# Patient Record
Sex: Female | Born: 1945 | Race: White | Hispanic: No | Smoking: Never smoker
Health system: Southern US, Community
[De-identification: ages and names within clinical notes are randomized; demographics above are authoritative.]

## PROBLEM LIST (undated history)

## (undated) DIAGNOSIS — M545 Low back pain, unspecified: Secondary | ICD-10-CM

## (undated) DIAGNOSIS — G8929 Other chronic pain: Secondary | ICD-10-CM

## (undated) DIAGNOSIS — D649 Anemia, unspecified: Secondary | ICD-10-CM

## (undated) DIAGNOSIS — R112 Nausea with vomiting, unspecified: Secondary | ICD-10-CM

## (undated) DIAGNOSIS — I10 Essential (primary) hypertension: Secondary | ICD-10-CM

## (undated) DIAGNOSIS — M1990 Unspecified osteoarthritis, unspecified site: Secondary | ICD-10-CM

## (undated) DIAGNOSIS — S72002A Fracture of unspecified part of neck of left femur, initial encounter for closed fracture: Principal | ICD-10-CM

## (undated) DIAGNOSIS — Z9889 Other specified postprocedural states: Secondary | ICD-10-CM

## (undated) HISTORY — PX: ANKLE SURGERY: SHX546

## (undated) HISTORY — DX: Fracture of unspecified part of neck of left femur, initial encounter for closed fracture: S72.002A

## (undated) HISTORY — PX: ABDOMINAL HYSTERECTOMY: SHX81

## (undated) HISTORY — PX: BACK SURGERY: SHX140

## (undated) HISTORY — PX: CHOLECYSTECTOMY: SHX55

## (undated) HISTORY — PX: HIP SURGERY: SHX245

## (undated) HISTORY — PX: APPENDECTOMY: SHX54

---

## 1979-12-08 HISTORY — PX: GASTRIC BYPASS: SHX52

## 1999-08-26 ENCOUNTER — Ambulatory Visit (HOSPITAL_COMMUNITY): Admission: RE | Admit: 1999-08-26 | Discharge: 1999-08-26 | Payer: Self-pay | Admitting: Preventative Medicine

## 1999-08-26 ENCOUNTER — Encounter: Payer: Self-pay | Admitting: Preventative Medicine

## 1999-09-11 ENCOUNTER — Ambulatory Visit (HOSPITAL_COMMUNITY): Admission: RE | Admit: 1999-09-11 | Discharge: 1999-09-11 | Payer: Self-pay | Admitting: Preventative Medicine

## 1999-09-11 ENCOUNTER — Encounter: Payer: Self-pay | Admitting: Preventative Medicine

## 2001-05-28 ENCOUNTER — Emergency Department (HOSPITAL_COMMUNITY): Admission: EM | Admit: 2001-05-28 | Discharge: 2001-05-28 | Payer: Self-pay | Admitting: Emergency Medicine

## 2001-06-10 ENCOUNTER — Encounter: Payer: Self-pay | Admitting: Family Medicine

## 2001-06-10 ENCOUNTER — Ambulatory Visit (HOSPITAL_COMMUNITY): Admission: RE | Admit: 2001-06-10 | Discharge: 2001-06-10 | Payer: Self-pay | Admitting: Family Medicine

## 2001-11-18 ENCOUNTER — Encounter: Admission: RE | Admit: 2001-11-18 | Discharge: 2001-11-18 | Payer: Self-pay | Admitting: Specialist

## 2001-11-18 ENCOUNTER — Encounter: Payer: Self-pay | Admitting: Specialist

## 2001-11-22 ENCOUNTER — Encounter: Payer: Self-pay | Admitting: Specialist

## 2001-11-22 ENCOUNTER — Encounter: Admission: RE | Admit: 2001-11-22 | Discharge: 2001-11-22 | Payer: Self-pay | Admitting: Specialist

## 2001-12-08 ENCOUNTER — Encounter: Payer: Self-pay | Admitting: Emergency Medicine

## 2001-12-08 ENCOUNTER — Emergency Department (HOSPITAL_COMMUNITY): Admission: EM | Admit: 2001-12-08 | Discharge: 2001-12-08 | Payer: Self-pay | Admitting: Emergency Medicine

## 2002-01-03 ENCOUNTER — Encounter: Payer: Self-pay | Admitting: Specialist

## 2002-01-06 ENCOUNTER — Encounter: Payer: Self-pay | Admitting: Specialist

## 2002-01-06 ENCOUNTER — Inpatient Hospital Stay (HOSPITAL_COMMUNITY): Admission: RE | Admit: 2002-01-06 | Discharge: 2002-01-09 | Payer: Self-pay | Admitting: Specialist

## 2002-01-06 ENCOUNTER — Encounter (INDEPENDENT_AMBULATORY_CARE_PROVIDER_SITE_OTHER): Payer: Self-pay | Admitting: Specialist

## 2002-01-08 ENCOUNTER — Encounter: Payer: Self-pay | Admitting: Specialist

## 2002-02-09 ENCOUNTER — Inpatient Hospital Stay (HOSPITAL_COMMUNITY): Admission: EM | Admit: 2002-02-09 | Discharge: 2002-02-12 | Payer: Self-pay | Admitting: Emergency Medicine

## 2002-02-09 ENCOUNTER — Encounter: Payer: Self-pay | Admitting: Specialist

## 2002-03-03 ENCOUNTER — Encounter: Payer: Self-pay | Admitting: Specialist

## 2002-03-03 ENCOUNTER — Ambulatory Visit (HOSPITAL_COMMUNITY): Admission: RE | Admit: 2002-03-03 | Discharge: 2002-03-03 | Payer: Self-pay | Admitting: Specialist

## 2002-06-13 ENCOUNTER — Encounter: Admission: RE | Admit: 2002-06-13 | Discharge: 2002-06-13 | Payer: Self-pay | Admitting: Specialist

## 2002-06-13 ENCOUNTER — Encounter: Payer: Self-pay | Admitting: Specialist

## 2002-06-15 ENCOUNTER — Encounter: Admission: RE | Admit: 2002-06-15 | Discharge: 2002-06-15 | Payer: Self-pay | Admitting: Specialist

## 2002-08-15 ENCOUNTER — Emergency Department (HOSPITAL_COMMUNITY): Admission: EM | Admit: 2002-08-15 | Discharge: 2002-08-16 | Payer: Self-pay | Admitting: *Deleted

## 2002-08-15 ENCOUNTER — Encounter: Payer: Self-pay | Admitting: *Deleted

## 2002-08-31 ENCOUNTER — Emergency Department (HOSPITAL_COMMUNITY): Admission: EM | Admit: 2002-08-31 | Discharge: 2002-08-31 | Payer: Self-pay | Admitting: Internal Medicine

## 2002-09-24 ENCOUNTER — Emergency Department (HOSPITAL_COMMUNITY): Admission: EM | Admit: 2002-09-24 | Discharge: 2002-09-24 | Payer: Self-pay | Admitting: Emergency Medicine

## 2002-09-27 ENCOUNTER — Encounter: Payer: Self-pay | Admitting: Specialist

## 2002-09-27 ENCOUNTER — Ambulatory Visit (HOSPITAL_COMMUNITY): Admission: RE | Admit: 2002-09-27 | Discharge: 2002-09-27 | Payer: Self-pay | Admitting: Specialist

## 2002-12-14 ENCOUNTER — Inpatient Hospital Stay (HOSPITAL_COMMUNITY): Admission: RE | Admit: 2002-12-14 | Discharge: 2002-12-19 | Payer: Self-pay | Admitting: Orthopaedic Surgery

## 2002-12-14 ENCOUNTER — Encounter (INDEPENDENT_AMBULATORY_CARE_PROVIDER_SITE_OTHER): Payer: Self-pay | Admitting: *Deleted

## 2002-12-14 ENCOUNTER — Encounter: Payer: Self-pay | Admitting: Orthopedic Surgery

## 2003-06-07 ENCOUNTER — Emergency Department (HOSPITAL_COMMUNITY): Admission: EM | Admit: 2003-06-07 | Discharge: 2003-06-07 | Payer: Self-pay | Admitting: Emergency Medicine

## 2003-07-07 ENCOUNTER — Emergency Department (HOSPITAL_COMMUNITY): Admission: EM | Admit: 2003-07-07 | Discharge: 2003-07-07 | Payer: Self-pay | Admitting: *Deleted

## 2003-07-07 ENCOUNTER — Encounter: Payer: Self-pay | Admitting: *Deleted

## 2003-08-05 ENCOUNTER — Emergency Department (HOSPITAL_COMMUNITY): Admission: EM | Admit: 2003-08-05 | Discharge: 2003-08-05 | Payer: Self-pay | Admitting: Emergency Medicine

## 2003-09-02 ENCOUNTER — Emergency Department (HOSPITAL_COMMUNITY): Admission: EM | Admit: 2003-09-02 | Discharge: 2003-09-02 | Payer: Self-pay | Admitting: Emergency Medicine

## 2003-10-14 ENCOUNTER — Emergency Department (HOSPITAL_COMMUNITY): Admission: EM | Admit: 2003-10-14 | Discharge: 2003-10-14 | Payer: Self-pay | Admitting: Emergency Medicine

## 2003-12-22 ENCOUNTER — Emergency Department (HOSPITAL_COMMUNITY): Admission: EM | Admit: 2003-12-22 | Discharge: 2003-12-22 | Payer: Self-pay | Admitting: Emergency Medicine

## 2004-02-04 ENCOUNTER — Emergency Department (HOSPITAL_COMMUNITY): Admission: EM | Admit: 2004-02-04 | Discharge: 2004-02-05 | Payer: Self-pay | Admitting: Emergency Medicine

## 2004-02-11 ENCOUNTER — Emergency Department (HOSPITAL_COMMUNITY): Admission: EM | Admit: 2004-02-11 | Discharge: 2004-02-11 | Payer: Self-pay | Admitting: *Deleted

## 2004-07-23 ENCOUNTER — Emergency Department (HOSPITAL_COMMUNITY): Admission: EM | Admit: 2004-07-23 | Discharge: 2004-07-23 | Payer: Self-pay | Admitting: Emergency Medicine

## 2004-07-23 HISTORY — PX: ESOPHAGOGASTRODUODENOSCOPY: SHX1529

## 2004-12-26 ENCOUNTER — Ambulatory Visit (HOSPITAL_COMMUNITY): Admission: RE | Admit: 2004-12-26 | Discharge: 2004-12-26 | Payer: Self-pay | Admitting: Family Medicine

## 2005-01-23 ENCOUNTER — Emergency Department (HOSPITAL_COMMUNITY): Admission: EM | Admit: 2005-01-23 | Discharge: 2005-01-23 | Payer: Self-pay | Admitting: Emergency Medicine

## 2005-01-28 ENCOUNTER — Ambulatory Visit (HOSPITAL_COMMUNITY): Admission: RE | Admit: 2005-01-28 | Discharge: 2005-01-28 | Payer: Self-pay | Admitting: Family Medicine

## 2007-09-16 ENCOUNTER — Ambulatory Visit (HOSPITAL_COMMUNITY): Admission: RE | Admit: 2007-09-16 | Discharge: 2007-09-16 | Payer: Self-pay | Admitting: Family Medicine

## 2009-12-07 HISTORY — PX: ESOPHAGOGASTRODUODENOSCOPY: SHX1529

## 2010-03-03 ENCOUNTER — Ambulatory Visit: Payer: Self-pay | Admitting: Gastroenterology

## 2010-03-03 ENCOUNTER — Ambulatory Visit (HOSPITAL_COMMUNITY): Admission: EM | Admit: 2010-03-03 | Discharge: 2010-03-03 | Payer: Self-pay | Admitting: Emergency Medicine

## 2010-03-07 ENCOUNTER — Encounter (INDEPENDENT_AMBULATORY_CARE_PROVIDER_SITE_OTHER): Payer: Self-pay | Admitting: *Deleted

## 2010-03-17 ENCOUNTER — Encounter (INDEPENDENT_AMBULATORY_CARE_PROVIDER_SITE_OTHER): Payer: Self-pay | Admitting: *Deleted

## 2010-12-26 ENCOUNTER — Emergency Department (HOSPITAL_COMMUNITY)
Admission: EM | Admit: 2010-12-26 | Discharge: 2010-12-26 | Payer: Self-pay | Source: Home / Self Care | Admitting: Emergency Medicine

## 2011-01-06 NOTE — Letter (Signed)
Summary: Generic Letter, Intro to Referring  [facility]  [address]   [place], [place] [address]   Phone: [phone]  Fax: [phone]      March 17, 2010             RE: Ellen Anderson   December 31, 1945                 [address]                 [place], [place]  [address]  Dear Kemper Durie,     The you referred the above mentioned patient to our office for GI consult. We have tried to contact the patient by phone and mail. She has not contacted our office for an appointment.    Sincerely,    [facility] Ph: [phone]   Fax: [phone]

## 2011-01-06 NOTE — Letter (Signed)
Summary: Appointment Reminder  [facility]  [address]   [place], [place] [address]   Phone: [phone]  Fax: [phone]       March 07, 2010   Ellen Anderson [address] [place], [place]  [address] 12/29/1945    Dear [name],  We have been unable to reach you by phone to schedule a follow up   appointment that was recommended for you by Dr. Darrick Penna. It is very   important that we reach you to schedule an appointment. We hope that you  allow [place] to participate in your health care needs. Please contact us at  [phone] at your earliest convenience to schedule your appointment.  Sincerely,    [facility] R. Roetta Sessions, M.D.    Jonette Eva, M.D. Lorenza Burton, FNP-BC    Tana Coast, PA-C Phone: [phone]    Fax: [phone]

## 2011-02-27 LAB — BASIC METABOLIC PANEL
CO2: 29 mEq/L (ref 19–32)
GFR calc non Af Amer: >60 mL/min (ref >60)
Glucose, Bld: 119 mg/dL — ABNORMAL HIGH (ref 70–99)
Potassium: 3.6 mEq/L (ref 3.5–5.1)
Sodium: 141 mEq/L (ref 135–145)

## 2011-04-24 NOTE — Op Note (Signed)
NAME:  Ellen Anderson, Ellen Anderson                           ACCOUNT NO.:  [id number]   MEDICAL RECORD NO.:  [id number]                   PATIENT TYPE:  INP   LOCATION:  2550                                 FACILITY:  [facility]   PHYSICIAN:  Sharolyn Douglas, M.D.                     DATE OF BIRTH:  14-Apr-1946   DATE OF PROCEDURE:  12/14/2002  DATE OF DISCHARGE:                                 OPERATIVE REPORT   PREOPERATIVE DIAGNOSES:  1. 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  2. Compression fractures, L3, 4 and 5 with chronic pain.  3. Osteoporosis.   PROCEDURE:  1. Revision, left L4 and 5 laminotomy with partial facetectomy and     foraminotomies decompressing the L4 and 5 nerve roots.  2. Kyphoplasty, L3, 4, and 5.  3. Deep bone biopsy, L3, 4 and 5.  4. Radiographic interpretation of fluoroscopic images used for kyphoplasty     at L3, 4 and 5.   SURGEON:  Sharolyn Douglas, M.D.   ASSISTANT:  Verlin Fester, P.A.   ANESTHESIA:  General endotracheal.   COMPLICATIONS:  None.   INDICATIONS:  The patient is a 65 year old female who had chronic back pain  secondary to compression fractures at L3, 4 and 5 due to severe osteoporosis  status post oophorectomy at a young age.  She also has left radiculopathy in  an L4 and 5 pattern with weakness.  She is status post a previous left L4-5  lateral recess decompression.  Her MRI and CT myelogram show persistent  recess narrowing and foraminal stenosis at L4-5.  Plain x-rays show the  compression deformities at L3, 4 and 5.  The risks, benefits and  alternatives to revision decompression and augmentation of the anterior  column with kyphoplasty were extensively reviewed with the patient, and she  elected to proceed.   PROCEDURE IN DETAIL:  The patient was properly identified in the holding  area and taken to the operating room.  She underwent general endotracheal  anesthesia without difficulty.   She was given prophylactic antibiotics.  She  was carefully turned onto the [place] table with bolsters across her chest  and hips to elicit a postural reduction of the compression deformities.  The  back was prepped and draped in the usual sterile fashion.  All bony  prominences were padded.  The patient's eyes were protected at all times.  The previous midline incision over the L4-5 interspace was utilized.  This  was carried down to the deep fascia.  The fascia was excised, and the  paraspinal muscles on the left side were stripped out to the L3-4 and 4-5  facet joint.  Care was taken to preserve the facet joint capsule.  We  identified the previous laminotomy defect.  There was an extensive amount of  scarring.  This was all carefully dissected free using a curette.  We then  draped the surgical microscope brought into the field.  An x-ray was taken  to confirm her location intraoperatively.  We then carefully mobilized the  scar tissue at the L4-5 interspace using microdissection.  A high-speed bur  was used to remove the residual lamina of L4 as well as the medial one-third  of the L4-5 facet joint complex.  The L4 nerve root was found to be encased  in epidural fibrosis, and this was dissected free using neurolysis.  We then  performed a foraminotomy at L4-5.  We confirmed that the L4 nerve root was  patent from its takeoff all the way out the foramen using a blunt probe.  Care was taken to preserve the pars interarticularis.  The L5 nerve root was  identified, freed up in the lateral recess.  There were several spurs off  the superior facet of L5, and these were removed flush with the pedicle.  Blunt probe again confirmed that the L5 root was free from its takeoff out  its respective foramen.  The wound was irrigated.  All bleeding was  controlled with bipolar electrocautery and Gelfoam.  Deep fascia closed with  a running #1 Vicryl suture.  Subcutaneous layer closed with 0 Vicryl   followed by 2-0 Vicryl.  The skin was approximated using 4-0 subcuticular  Vicryl suture.  Benzoin and Steri-Strips were placed.   At this point in the procedure, bi-planar fluoroscopy was brought into the  field.  We obtained AP/lateral images of L3.  A Jamshidi needle was utilized  to cannulate the pedicle on the left side.  We then placed a guidewire  through the Jamshidi needle and a working cannula was established over this.  An intervertebral bone tamp was then placed through the working cannula and  inflated to 7 cubic centimeters.  At no point did the pressure go above 100  PSI.  We did this under live fluoroscopy and had a partial reduction of the  vertebral endplates.  We then pushed 6 cubic centimeters of partially  hardened methylmethacrylate cement mixed with barium through the working  cannula.  We observed this under direct AP/lateral live fluoroscopic images.  It should be noted that a deep biopsy was obtained with a trocar before the  bone tamp was placed through the working cannula.  A similar procedure was  carried out at L4 and 5 using a right-sided approach to avoid our laminotomy  defect on the left.  Again, we were able to inflate the balloon tamp to 7  cubic centimeters.  The pressure did not go above 100 PSI.  We placed 6  cubic centimeters of cement in both the L4 and 5 bodies.  Similar biopsy was  taken at each level.  The small stab incisions were closed with a simple  nylon suture.  Sterile dressing was applied.  The patient was turned supine  and extubated without difficulty, moving her upper and lower extremities  when she was transferred to the recovery room in stable condition.                                               Sharolyn Douglas, M.D.    MC/MEDQ  D:  12/14/2002  T:  12/14/2002  Job:  [id number]

## 2011-04-24 NOTE — H&P (Signed)
NAME:  Ellen Anderson, Ellen Anderson                           ACCOUNT NO.:  [id number]   MEDICAL RECORD NO.:  [id number]                   PATIENT TYPE:   LOCATION:                                       FACILITY:  [facility]   PHYSICIAN:  Sharolyn Douglas, M.D.                     DATE OF BIRTH:  [birth year]   DATE OF ADMISSION:  12/14/2002  DATE OF DISCHARGE:                                HISTORY & PHYSICAL   CHIEF COMPLAINT:  Back and bilateral lower extremity pain, left greater than  right.   HISTORY OF PRESENT ILLNESS:  The patient is a 65 year old female status post  lumbar surgery back in January 2003 with continued back and bilateral lower  extremity pain.  The pain has failed conservative measures and has gotten to  the point that it is near constant in nature and is interfering with her  activities of daily living and significantly affecting her quality of life.  Risks and benefits of surgery were discussed with the patient by Dr. Noel Gerold  as well as myself.  She indicated understanding and opted to proceed.   ALLERGIES:  CODEINE, MORPHINE, SULFA, ROBAXIN, ALBUTEROL, MYCINS, CECLOR,  AUGMENTIN, and PERCOCET.   MEDICATIONS:  Valium, Vioxx, Mepergan Forte, Neurontin, and Ultram.   PAST MEDICAL HISTORY:  Degenerative disk disease.   PAST SURGICAL HISTORY:  1. Cholecystectomy.  2. Hysterectomy.  3. Gastric bypass.   SOCIAL HISTORY:  The patient denies tobacco use.  She uses alcohol on rare  social basis.  She is a widow, works in a [employer], [employer], and she does have family  to help her through her postoperative course.   FAMILY MEDICAL HISTORY:  Noncontributory.   REVIEW OF SYSTEMS:  The patient denies any fevers, chills, sweats, bleeding  tendencies.  She denies any blurred vision, double vision, seizures,  headache, paralysis. CARDIOVASCULAR:  Denies chest pain, angina,orthopnea,  claudication, palpitations.  PULMONARY: Denies shortness of breath,  productive cough, or hemoptysis.  GI: Denies nausea,  vomiting, constipation,  diarrhea, melena, bloody stool  GU: Denies dysuria, hematuria, or discharge.  MUSCULOSKELETAL: As per HPI.   PHYSICAL EXAMINATION:  VITAL SIGNS:  Blood pressure 162/92, respirations 60  and unlabored. Pulse 82 and regular.  GENERAL:  The patient is 65 year old white female, alert and oriented, in no  acute distress, well nourished and well groomed, appears stated age,  pleasant and cooperative with exam.  HEENT:  Head normocephalic and atraumatic.  Pupils are equal, round, and  reactive.  Extraocular movements intact.  Nares patent.  Pharynx clear.  NECK:  Supple to palpation. No bruits appreciated.  No lymphadenopathy or  thyromegaly noted.  CHEST:  Clear to auscultation bilaterally.  No rales, rhonchi, wheeze.  BREASTS:  Not pertinent, not performed.  HEART:  S1, S2.  Regular rate and rhythm.  No murmurs, rubs, or gallops  noted.  ABDOMEN:  Soft to  palpation, nontender, nondistended.  No organomegaly  noted.  GU:  Not pertinent, not performed.  EXTREMITIES:  Grossly neurovascularly intact.  Motor and sensation grossly  intact.  Pulses intact and equal at dorsalis pedis and posterior tibialis.  SKIN:  Intact without lesion or rash.   IMPRESSION:  1. L3 through L5 compression fracture.  2. Spinal stenosis.  3. Radiculopathy.   PLAN:  Admit to [facility] [place] on 12/14/2002 to undergo laminectomy L4 to  L5 and also L3, L4, and L5 kyphoplasty.  This will be done by Dr. Sharolyn Douglas.     Verlin Fester, P.A.                       Sharolyn Douglas, M.D.    CM/MEDQ  D:  12/19/2002  T:  12/19/2002  Job:  [id number]

## 2011-04-24 NOTE — Discharge Summary (Signed)
NAME:  Ellen Anderson, Ellen Anderson                           ACCOUNT NO.:  [id number]   MEDICAL RECORD NO.:  [id number]                   PATIENT TYPE:  INP   LOCATION:  5030                                 FACILITY:  [facility]   PHYSICIAN:  Sharolyn Douglas, M.D.                     DATE OF BIRTH:  [birth year]   DATE OF ADMISSION:  12/14/2002  DATE OF DISCHARGE:  12/19/2002                                 DISCHARGE SUMMARY   ADMITTING DIAGNOSES:  L3-L5 compression fracture, spinal stenosis, and  associated radiculopathy.   DISCHARGE DIAGNOSES:  1. Status post L4-5 revision decompression and status post kyphoplasty of     L3, L4, and L5.  2. Postoperative urinary retention which was resolved by discharge.   PROCEDURES:  L4-5 revision decompression and kyphoplasty of L3, L4, and L5.  Surgeon:  Sharolyn Douglas, M.D.  Assistant:  Verlin Fester, P.A.  Anesthesia:  General.  Blood Loss:  300 mL.   CONSULTS:  Excell Seltzer. Annabell Howells, M.D.   HISTORY OF PRESENT ILLNESS:  The patient is a 66 year old female status post  lumbar surgery in January of 2003 with continued back and bilateral lower  extremity pain.  The patient has failed conservative measures.  The pain has  gotten to the point that it is near constant in nature extending from the  back __________ quality of life.  It is severe and she is quite frustrated  with this.  The risks and benefits of surgery were discussed with the  patient by Sharolyn Douglas, M.D., as well as myself.  She indicated an  understanding and opted to proceed.   LABORATORY DATA:  On December 12, 2002, the CBC with differential was within  normal limits with a hemoglobin of 10.3, hematocrit 32.4, __________ 10%,  __________, and monos 8.9.  The PT, INR, and PTT were within normal limits.  The comprehensive metabolic panel was within normal limits with the  exception of a BUN of 3.2 and an ALP of 33.  Postoperatively her hemoglobin  and hematocrit were monitored and reached a low of 8.3 and 25.7,  respectively.  However, she was asymptomatic and did not require blood  transfusion.  The BMET from December 15, 2002, was within normal limits with a  glucose of 130, BUN 4, and calcium 8.  X-rays from December 14, 2002, were  used to localize intraoperatively and did show L3, L4, and L5 kyphoplasties  with methyl methacrylate within the bodies of the vertebrae.  Also used to  localize the L4-5 disk level.   HOSPITAL COURSE:  On December 14, 2002, the patient was taken to the operating  room for the above-listed procedures.  She tolerated the procedures well.  The estimated blood loss was 300 cubic centimeters.  There were no drains  placed.  No complications.  She was transferred to the recovery room in  stable condition.  Neurovascularly intact upon admission to the hospital and  remained intact and continued throughout the hospital stay.  Appropriate  antibiotic course was completed without difficulty.  Bedside incentive  spirometer was utilized to decrease the chance of any pulmonary  complications.  Diet was slowly advanced to a regular home diet as tolerated  without any significant difficulty.  Pain control was continued with a  combination of p.o., as well as IV pain medications.  She was transitioned  over strictly to p.o. by day #2.  She remained intact and under control for  the remainder of her stay.  The operative dressing was taken down on  postoperative day #2.  She had a real good looking incision with no signs or  symptoms of infection.  Daily dressing changes were done.  The incision  continued to look good.  Physical therapy and occupational therapy were  consulted to work with the patient on a progressive ambulation program, as  well as back precautions.  She did well with them, getting to the point that  she was safe and independent prior to discharge.  The hemoglobin on  __________/04 was 8.3, however, she was asymptomatic and did not require any  transfusions at that  time.  The Foley was taken out on postoperative day #2.  She was having difficulty urinating.  She was in and out catheterized each  shift.  She was started on Ditropan XL 10 mg p.o., however, this did not  help her symptoms.  By December 18, 2002, she was still having urinary  retention and inability to void.  Excell Seltzer. Annabell Howells, M.D., was consulted at that  point via phone and recommended that she have a leg bag placed, be  discharged with that, to follow up with him, and to start on Urecholine  prior to seeing him.  While attempting to get the leg bag arranged, we were  unable to get that arranged by a reasonable time that evening.  The  patient's family had to go.  Therefore, her discharge was held until December 19, 2002.  Nurses were getting ready to place a Foley catheter when she was  able to spontaneously void a great amount without any residual volume.  Therefore, she was discharged home without the leg bag.  No need to follow  up with Excell Seltzer. Annabell Howells, M.D., unless she had continued problems with her  urinary retention.  By December 19, 2002, the patient was medically stable,  had orthopedically met all goals, and was ready for discharge.  The vital  signs were stable.  She was afebrile.  Motor and sensation were intact and  unchanged.  The incision looked good without any signs or symptoms of  infection.   DISCHARGE PLANNING:  The patient is a 65 year old female, status post  microdiskectomy and three-level microdiskectomy and three-level kyphoplasty  with urinary retention resolved.   ACTIVITY:  Weightbearing as tolerated.  Progressive ambulation.  No lifting  greater than 10 pounds.   WOUND CARE:  Daily dressing changes.  May shower on postoperative day #5.   FOLLOW-UP:  Follow up two weeks postoperatively with Sharolyn Douglas, M.D.  Call  for an appointment.   DISCHARGE MEDICATIONS:  Prescriptions were given for Mepergan Fortis, Valium, and Vioxx.  Over the counter laxative and  Tylenol as needed.   DIET:  Regular home diet.   CONDITION ON DISCHARGE:  Stable and improved.   DISPOSITION:  The patient is being discharged to her home with the family's  assistance as needed.     Verlin Fester, P.A.                       Sharolyn Douglas, M.D.    CM/MEDQ  D:  01/11/2003  T:  01/12/2003  Job:  [id number]

## 2011-04-24 NOTE — Op Note (Signed)
[facility]  Patient:    Ellen Anderson, Ellen Anderson Visit Number: [id number] MRN: [id number]          Service Type: SUR Location: [id number] Attending Physician:  Pierce Crane Dictated by:   Javier Docker, M.D. Proc. Date: 01/06/02 Admit Date:  01/06/2002                             Operative Report  PREOPERATIVE DIAGNOSIS:  Spinal stenosis, lateral recess stenosis at lumbar vertebrae-4/5 bilaterally.  POSTOPERATIVE DIAGNOSIS:  Spinal stenosis, lateral recess stenosis at lumbar vertebrae-4/5 bilaterally.  OPERATION:  Bilateral hemilaminotomy, partial median hemifasciectomy, lateral decompression, foraminotomy L4 bilaterally.  Bone biopsy.  SURGEON:  Javier Docker, M.D.  ASSISTANT:  Ottie Glazier. Wynona Neat, P.A.-C.  ANESTHESIA:  General.  INDICATION:  This is a 65 year old female with lower extremity radicular pain predominately on the left, recently right limb examined in the preoperative room.  CT myelogram demonstrated lateral recess stenosis on the AP.  This was status post compression fracture with resulting ligamentum flavum proximally and facet hypertrophy compressing the lateral recess.  Operative management was indicated for decompression of the L4 lateral recess and hemilaminotomy for lateral recess decompression with preserve of the interspinous ligament.  Risks and benefits were discussed including bleeding, infection, damage to the vascular structures, CSF leak, fibrosis, persistent back pain.  DESCRIPTION OF PROCEDURE:  The patient was placed in the supine position. After induction of adequate general anesthesia and 1 gm Kefzol, the patient was placed on the prone and bony prominence well padded in the lower region. He was prepped and draped in the usual fashion.  A spinal needle was utilized to localize the 4/5 interspace, confirmed with x-ray.  An incision was made to the spinous process of L4 to S1.   Subcutaneous tissue dissected.  Electrocautery was utilized to achieve hemostasis.  First lumbar fascia was divided in the line of the interspinous ligament at L4/5. Paraspinous muscle was elevated from the lamina carefully from L4 and L5 bilaterally.  McCullough retractor was placed.  Multiple x-rays were taken to localize the L4/5 interspace.  There was a very small interlaminal space.  The bone was very soft, and therefore we took a bone biopsy from the lamina of L4 and sent to pathology.  Next, attention was turned towards the left first hemilaminotomy.  It was performed at L4/5, cephalad, and the caudad edge respective.  Partial median hemifasciectomy was performed with a 2 mm Kerrison.  Severe ligamentum flavum hypertrophy was noted compressing the lateral recess and specifically the 5 root with some invagination of the L4 foramen.  This was meticulously decompressed with a 2 mm Kerrison performing a foraminotomy at L4 and at L5 decompressing the lateral recess.  A large epidural vein was noted and bipolar cautery was utilized to obtain hemostasis.  This was unruptured.  Prior to the decompression, there was significant stenosis in the L5 and L4 foramen.  Following decompression, hockey stick probe placed there and found to be widely patent.  In a similar fashion, hemilaminotomy and partial median hemifasciectomy was performed on the right with decompression of the lateral recess.  Severe stenosis was noted here.  The L4 foramen was not as stenotic as on the left side, however.  Foraminotomies were performed at L4/5 decompressing the L4 and L5 root.  Bipolar electrocautery was utilized to achieve strict hemostasis. Again soft bone was noted here.  The interspinous  ligament was preserved with thrombin soaked gel.  Inspection revealed no evidence of active bleeding or CSF leak.  Thrombin soaked Gelfoam was placed in the hemifasciectomy and laminotomy defects. McCullough retractor  was removed and final probe was placed in the foramen at L4 and confirmed with x-ray.  Paraspinous muscle was inspected and no evidence of active bleeding.  Dorsal lumbar fascia was reapproximated with #1 Vicryl in figure-of-eight sutures.  Subcutaneous tissue reapproximated with 2-0 Vicryl simple sutures.  Skin was reapproximated with staples and dressed sterilely.  The patient was placed supine on the table, extubated without difficulty, and transported to recovery in satisfactory condition. Dictated by:   Javier Docker, M.D. Attending Physician:  Pierce Crane DD:  01/06/02 TD:  01/08/02 Job: [id number]

## 2011-04-24 NOTE — Op Note (Signed)
NAME:  Ellen Anderson, Ellen Anderson                           ACCOUNT NO.:  [id number]   MEDICAL RECORD NO.:  [id number]                   PATIENT TYPE:  EMS   LOCATION:  ED                                   FACILITY:  [facility]   PHYSICIAN:  Lionel December, M.D.                 DATE OF BIRTH:  13-Dec-1945   DATE OF PROCEDURE:  07/23/2004  DATE OF DISCHARGE:  07/23/2004                                 OPERATIVE REPORT   PROCEDURE:  Esophagogastroduodenoscopy with foreign body removal followed by  completion of examination and esophageal dilation.   INDICATIONS:  Ellen Anderson is a 65 year old Caucasian female who presented to the  emergency room earlier today with inability to swallow foods, liquids, or  saliva, and she had been spitting up her saliva. She was seen by Dr. Reuel Boom  and felt a foreign body impacting her esophagus. She in fact was eating pork  chop. There was no prior history of dysphagia although she has had gastric  bypass surgery 24 years ago for obesity.   Procedure risks were reviewed with the patient and informed consent was  obtained.   PREOPERATIVE MEDICATIONS:  Cetacaine spray for pharyngeal topical  anesthesia, Demerol 50 mg IV, Versed 6 mg IV in divided doses.   FINDINGS:  Procedure performed in endoscopy suite. The patient's vital signs  and O2 saturations were monitored during the procedure and remained stable.  The patient was placed in left lateral position, and her upper half of the  body was kept elevated. Olympus video scope was passed via oropharynx  without any difficulty into the esophagus. There was a foreign body right at  or just below UES. I was able to break it with the scope, and I was able to  pass the scope distally. There was some food debris in the gastric pouch. GE  junction, however, was unremarkable. Scope was pulled back into the upper  esophagus, and I was able to force this piece or foreign body into distal  esophagus and gastric pouch without any difficulty.  This area was examined.  There was a mucosal disruption felt to be site of web. There was some  dilation of lumen in this area, but there was no Zenker's diverticulum. The  mucosa of the rest of the esophagus was normal.   Stomach:  Small gastric pouch which had some food in it. Gastrojejunostomy  was patent without ulceration.   Small bowel:  Short segment of jejunum was examined and was normal.  Endoscope was withdrawn.   Esophagus was dilated by passing 56-French [facility] dilator which was only  passed to about 45 cm without any difficulty. The dilator was withdrawn. The  patient tolerated the procedure well.   FINAL DIAGNOSES:  1. Foreign body impacted at cervical esophagus just below UES. This was     removed with the technique as above. 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 It was subsequently     dilated by passing 56-French [facility] dilator.   RECOMMENDATIONS:  1. She will resume her regular diet. Patient given Valium 5 mg tablet 6     doses 1 to 2 at bedtime.  2. The patient was advised to take calcium with vitamin D at least 1.2 g     q.d. She has osteoporosis. She is on Actigall but not on any calcium     supplement.  3. The patient was also advised to cut back intake of aspirin to more than 8     a day. Even that increases the risk of GI complications. She apparently     has an appointment to be seen at pain clinic.      ___________________________________________                                            Lionel December, M.D.   NR/MEDQ  D:  07/23/2004  T:  07/24/2004  Job:  [id number]   cc:   Angus G. Renard Matter, M.D.  [address]  [place]  [place] [address]  Fax: [phone]

## 2011-04-24 NOTE — Discharge Summary (Signed)
[place]. [facility]  Patient:    [name], [name] Visit Number: [id number] MRN: [id number]          Service Type: MED Location: [phone] Attending Physician:  Rocky Crafts Dictated by:   Marcie Bal Troncale, P.A.C. Admit Date:  02/08/2002 Discharge Date: 02/12/2002                             Discharge Summary  PRIMARY DIAGNOSIS:  Intractable postoperative pain, low back and left leg.  SECONDARY DIAGNOSES: 1. Status post bilateral hemilaminectomy and lateral foramintomy at    L4 performed, January 06, 2002. 2. History of migraine headaches. 3. History of chronic bronchitis. 4. History of pneumonia. 5. History of nephrolithiasis. 6. Osteoporosis.  SURGICAL PROCEDURES:  None.  CONSULTATIONS:  None.  LABORATORY DATA:  A venous Doppler of the bilateral lower extremities was obtd on February 09, 2002, it showed no evidence of DVT, superficial thrombus or Bakers cyst.  Admission hemoglobin 9.9, hematocrit 30.0.  Sedimentation rate 23 early on February 09, 2002, and then 31 late on February 09, 2002.  Reaching chemistry preop showed a low potassium at 3.2; sodium 140, BUN 3.0, creatinine 0.5.  On February 11, 2002 potassium had improved to 3.9 and sodium remained within normal limits at 139 and creatinine 0.5.  C-reactive protein early on February 09, 2002 was 0.7, later in the day it was 1.3.  Scratch screen was negative on February 11, 2002.  CHIEF COMPLAINT:  Continued back pain and leg pain postoperatively.  HISTORY OF PRESENT ILLNESS:  Ellen Anderson is a 65 year old female who originally underwent bilateral hemilaminectomy and lateral decompressive foraminotomy at L4 bilaterally by Dr. Shelle Iron.  This was on January 06, 2002. She did well during her postoperative course; did run a little low-grade Dictated by:   Marcie Bal Troncale, P.A.C. Attending Physician:  Rocky Crafts DD:  02/27/02 TD:  02/28/02 Job: [id number]

## 2011-04-24 NOTE — H&P (Signed)
[place]. [facility]  Patient:    Ellen Anderson, Ellen Anderson Visit Number: [id number] MRN: [id number]          Service Type: MED Location: [phone] Attending Physician:  Rocky Crafts Dictated by:   Judeth Porch. Perkins, P.A.-C. Admit Date:  02/08/2002   CC:         Javier Docker, M.D.   History and Physical  CHIEF COMPLAINT:  Continued back pain and leg pain postoperatively.  HISTORY OF PRESENT ILLNESS:  Patient is a 65 year old female who was recently admitted to [facility] to undergo back surgery per Dr. Javier Docker back on January 31st.  She underwent a bilateral hemilaminectomy with partial median hemifacetectomy and lateral decompression foraminotomy at L4 bilaterally; the surgery was performed by Dr. Jene Every.  Patient did very well throughout the hospital course, however, she did have some low-grade fevers in the postop but did improve with incentive spirometer and medications.  She was discharged home and did quite well in the initial postoperative period, up until approximately one week ago. Last week while she was undergoing outpatient physical therapy, she developed a reoccurrence of some back spasms.  Medications were called in by the office this past Thursday; yesterday she finished her prednisone and last night she woke up with severe pain in the back that radiated down into the leg.  She also has complained of some left-sided chest discomfort when she inspires with a deep breath.  Due to the increase in severity of her pain, she was brought into the office at [facility] and was evaluated by Dr. Michael Litter. Carter in the absence of Dr. Shelle Iron.  Patient was seen and evaluated and felt that we would best serve the patient by admitting her to the hospital for pain measures and also workup to rule out recurrent disk or possibly even a discitis or abscess/infection; this is explained to the patient and  she is subsequently admitted to the hospital.  ALLERGIES:  MORPHINE "stops heart," CODEINE causes some breathing difficulty and swelling, STADOL causes hallucination, PERCOCET and PERCODAN both cause hallucinations, IVP DYE caused seizures, ALBUTEROL had an adverse reaction, however, she does not recall the specific reaction, ROBAXIN causes mouth ulcers, FLEXERIL makes her "crazy," MICINS cause mouth blisters, SULFA causes breathing difficulties, DARVOCET causes hives.  CURRENT MEDICATIONS: 1. Actonel weekly. 2. Mepergan Fortis two every four hours as needed for pain.  PAST MEDICAL HISTORY:  Migraines (last onset approximately 20 years ago), history of chronic bronchitis, history of pneumonia, history of nephrolithiasis approximately 20 years ago, history of osteoporosis.  PAST SURGICAL HISTORY:  Cholecystectomy in 1968, hysterectomy in 1974, right wrist surgery as well as left ankle surgery and a gastric bypass in 1981, and most recently, the bilateral hemilaminectomy with foraminotomy performed on January 06, 2002.  SOCIAL HISTORY:  She is widowed with two children.  She works as a Geophysicist/field seismologist at [employer].  Denies use of tobacco products.  Essentially no alcohol, only very seldom on holidays.  FAMILY HISTORY:  Mother deceased at age 23 with esophageal cancer.  Father living, age 47.  REVIEW OF SYSTEMS:  GENERAL:  Patient denies any recent fevers, chills, night sweats.  NEUROLOGIC:  No seizures, syncope or paralysis, however, patient has had some radicular complaints extending from the back into the left anterior thigh and the posterior left calf.  RESPIRATORY:  She does state she has some tightness with a full deep breath, more  so on the left chest wall.  She denies any wheezing, shortness of breath or productive cough.  CARDIOVASCULAR:  No chest pain, angina or orthopnea.  GI:  Patient denies any nausea, vomiting, diarrhea, constipation, blood and mucus in the  stool.  GU:  No dysuria, hematuria or discharge.  MUSCULOSKELETAL:  Pertinent to that of the low back and pain that extends down into the left anterior thigh and also posterior left calf pain.  PHYSICAL EXAMINATION:  VITAL SIGNS:  Pulse 84, respirations 18, blood pressure is 150/95.  GENERAL:  Patient is a 65 year old white female, well-nourished, well-developed, appears to be in mild distress secondary to her pain.  She is alert, oriented and cooperative, appears her stated age.  She appears to be an excellent historian.  HEENT:  Normocephalic, atraumatic.  Pupils are equal and round.  EOMs are intact.  Oropharynx is clear.  NECK:  Supple.  No carotid bruits are auscultated bilaterally.  CHEST:  Chest is clear, anterior/posterior auscultation.  No rhonchi or rales or wheezes are appreciated in either lung field.  HEART:  Regular rate and rhythm.  S1 and S2 are noted.  No rubs, thrills, palpitations or murmurs are appreciated.  ABDOMEN:  Soft, round, nontender.  Bowel sounds are present.  RECTAL:  Deferred, not pertinent to present illness.  BREASTS:  Deferred, not pertinent to present illness.  GENITALIA:  Deferred, not pertinent to present illness.  EXTREMITIES:  Significant for that to the left lower extremity.  Motor function is intact.  Patient has leg extension, dorsiflexion, plantar flexion and EHL functions are intact.  Sensation is intact throughout the left lower extremity.  She has a positive straight leg raise with reproduction of the pain in the left lower extremity.  She has no noticeable calf swelling or palpable cords, however, she does have tenderness to deep palpation in the area of the proximal posterior calf just distal to the popliteal fossa. Again, no palpable cords are appreciated.  IMPRESSION: 1. Low back pain with left leg pain, intractable postoperative pain. 2. Status post bilateral hemilaminectomy with foraminotomy at L4 performed on    January 06, 2002. 3. History of migraines.  4. History of chronic bronchitis. 5. History of pneumonia. 6. History of nephrolithiasis. 7. History of osteoporosis.  PLAN:  Patient will be admitted to [facility] to undergo MRI of the lumbar spine; she will also undergo blood work for basic chemistry and also to rule out infection.  She will be placed at bedrest and given pain measures. Dictated by:   Alexzandrew L. Perkins, P.A.-C. Attending Physician:  Rocky Crafts DD:  02/08/02 TD:  02/09/02 Job: [id number]

## 2011-04-24 NOTE — Discharge Summary (Signed)
[place]. [facility]  Patient:    Ellen Anderson, Ellen Anderson Visit Number: [id number] MRN: [id number]          Service Type: MED Location: [phone] Attending Physician:  Rocky Crafts Dictated by:   Dorie Rank, P.A.C. Admit Date:  02/08/2002 Disc. Date: 01/09/02                             Discharge Summary  ADMISSION DIAGNOSES: 1. 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 2. History of migraines. 3. History of bronchitis. 4. History of renal stones. 5. Osteoporosis.  DISCHARGE DIAGNOSES: 1. 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 2. History of migraines. 3. History of bronchitis. 4. History of renal stones. 5. Osteoporosis.  PROCEDURE:  On January 06, 2002, the patient underwent a bilateral hemilaminotomy, partial ___________ and lateral decompression foraminotomy L4 bilaterally.  Bony biopsy.  SURGERY:  Javier Docker, M.D.  ASSISTANT:  Ottie Glazier. Wynona Neat, P.A.-C.  ANESTHESIA:  General anesthesia.  COMPLICATIONS:  None.  HISTORY OF PRESENT ILLNESS:  This is a 65 year old female with lower extremity radicular pain, predominantly on the left, recently right limb examined.  CT myelogram demonstrated lateral recess stenosis on the AP view.  This was status post compression fracture with resulting ligamentum flavum proximally and facet hypertrophy compressing the lateral recess.  Operative management was indicated for decompression of L4 lateral recess and hemilaminotomy for lateral recess decompression with preserve of the interspinous ligament. The risks and benefits as well as the procedure were discussed with the patient. She presented to the hospital to proceed.  CONSULTS:  None.  HOSPITAL COURSE:  The patient had the above stated surgery. She tolerated it well. She was transferred to the PACU in stable condition.  While in the operating room, the incision was  dressed in a sterile fashion.  This was clean, dry and intact on postop day #1.  Dressing was changed on postop day #2 and daily thereafter and found to be free of any erythema or drainage. Hemoglobin and hematocrit were checked on postop day #1 and found to be stable.  Initially the patient utilized a IM pain medication. She was weaned off of this and utilized p.o. medications throughout the remainder of the hospital stay.  Appropriate IV antibiotics were used postoperatively.  Vitals and neurovascular checks were routinely performed postoperatively and all found to be intact.  Regular diet was resumed. She had good p.o. intake in the hospital.  She did run a mild fever while in the hospital. She utilized aspirin for defervescence as well as incentive spirometry.  Chest x-ray was obtained and found to be negative.  Physical therapy and occupational therapy worked with the patient for ambulation and ADLs.  On January 09, 2002, the patient was felt to be medically and orthopedically stable for discharge.  LAB VALUES:  H&H on January 03, 2002, were 11.7 and 34.8.  On January 08, 2002, hemoglobin was 9.3, hematocrit 28.2.  BMET within normal limits on January 03, 2002.  RADIOLOGY:  On January 08, 2002, two view chest x-ray revealed no active disease.  Intraoperative films from January 06, 2002, revealed localization of L4-5.  CARDIOLOGY:  Tracings from January 03, 2002, revealed normal sinus rhythm, confirmed by Jaclyn Prime. Lucas Mallow, M.D.  CONDITION ON DISCHARGE:  Improved.  DISCHARGE PLANS AND MEDICATIONS:  The patient was discharged home, low back, precautions, daily dressing changes.  Follow-up in two weeks with Javier Docker, M.D.  Continue incentive spirometry at home. Aspirin for fever.  If greater than 101, call the office of family medical physician.  She was given prescription for [employer] and Robaxin. Continue home medications. Regular diet. Dictated by:   Dorie Rank,  P.A.C. Attending Physician:  Rocky Crafts DD:  02/08/02 TD:  02/09/02 Job: [id number]

## 2011-04-24 NOTE — H&P (Signed)
[place]. [facility]  Patient:    Ellen Anderson, Ellen Anderson Visit Number: [id number] MRN: [id number]          Service Type: EMS Location: ED Attending Physician:  Herbert Seta Dictated by:   Ottie Glazier Wynona Neat, P.A.-C. Admit Date:  12/08/2001 Discharge Date: 12/08/2001                           History and Physical  DATE OF BIRTH:  09/11/1946  SOCIAL SECURITY NUMBER:  [id number]  CHIEF COMPLAINT:  Bilateral lower extremity pain.  HISTORY OF PRESENT ILLNESS:  Ellen Anderson is a 65 year old white female who has been seen and evaluated at [facility] by Dr. Javier Docker.  Patient has had a history of bilateral leg pain, in the left greater than the right, radiating to the lateral aspects of the calves and thighs.  Patient was found to have lateral recess stenosis from an L4 compression fracture and after a lengthy discussion with the patient, she has decided to proceed with operative intervention.  ALLERGIES:  MORPHINE, CODEINE, STADOL, PERCODAN, PERCOCET, IVP DYE, ALBUTEROL, SOME TYPE OF QUESTIONABLE MUSCLE RELAXER, MYCINS and SULFA.  MEDICATIONS: 1. Actonel once weekly. 2. Mepergan Forte one p.o. t.i.d.  PAST MEDICAL HISTORY:  Significant for a history of migraines, with the last onset approximately 20 years ago; history of chronic bronchitis; history of nephrolithiasis; history of osteoporosis.  PAST SURGICAL HISTORY:  She has had a cholecystectomy in 1968, hysterectomy in 1974, right wrist surgery as well as left ankle surgery and a gastric bypass in 1981.  SOCIAL HISTORY:  She is widowed with two children.  She works at [employer].  FAMILY HISTORY:  Her mother is deceased from esophageal cancer.  Father is 62 and living.  REVIEW OF SYSTEMS:  In general, patient denies any recent weight loss, night sweats, fever or chills.  HEENT:  She denies any chronic headaches currently. No seeing spots or specks,  ringing of the ears, runny nose or sore throat. CHEST:  She denies any productive cough, chronic cough or hemoptysis.  She recently had an exacerbation of bronchitis two weeks ago.  CARDIOVASCULAR: She denies any chest pains, irregular heart beats, syncopal episodes, or shortness of breath.  GI/GU:  Denies any history of chronic diarrhea, constipation, melena or bright red stools per rectum.  No dysuria. EXTREMITIES:  Please see HPI.  NEUROLOGIC:  Denies any history of seizures or strokes.  PHYSICAL EXAMINATION:  VITAL SIGNS:  Blood pressure is 130/90, respirations 16, pulse is 60.  GENERAL:  This is a pleasant 65 year old white female in no acute distress.  HEENT:  Head is atraumatic, normocephalic.  NECK:  Supple without masses.  No carotid bruits auscultated.  CHEST:  Clear to auscultation bilaterally.  BREASTS:  Examination deferred, not pertinent to present illness.  HEART:  Regular rate and rhythm.  S1 and S2.  ABDOMEN:  Soft, nontender.  Bowel sounds are positive.  No guarding or rebound.  GENITOURINARY:  Deferred, not pertinent for present illness.  EXTREMITIES:  Patient does walk with an antalgic gait.  Straight leg raise on the left produces buttock, posterior thigh and calf pain and exacerbated with a dorsal augmentation maneuver.  Straight leg raise on the right produces pain.  Motor is 5/5.  She is normoreflexic.  Sensation is intact to light touch and position.  No Babinski or clonus.  Hips, knees and ankles have painless range  of motion except for the left, which is status post ORIF. Nontender over the thoracic spine.  She has limited extension with some back pain elicited.  LABORATORY AND ACCESSORY DATA:  Myelogram reveals a lateral recess stenosis at L4-L5 with old compression fracture at L4.  IMPRESSION: 1. Lateral recess stenosis, L4-L5, with subsequent radiculopathic pain. 2. History of migraines, bronchitis, renal stones and osteoporosis.  PLAN:   Patient will be admitted to the [facility] to undergo a lumbar decompression at L4-L5 per Dr. Jene Every on January 06, 2002 at 1:30 p.m. He has discussed with her the risks and benefits of this procedure, to which she stated good understanding prior to entering the operating suite.  Her primary care physician is Dr. Butch Penny, who she is to have medical clearance obtained from prior to the procedure. Dictated by:   Ottie Glazier. Wynona Neat, P.A.-C. Attending Physician:  Herbert Seta DD:  01/01/02 TD:  01/02/02 Job: [id number]

## 2011-04-24 NOTE — Discharge Summary (Signed)
[place]. [facility]  Patient:    Ellen Anderson, Ellen Anderson Visit Number: [id number] MRN: [id number]          Service Type: MED Location: [phone] Attending Physician:  Rocky Crafts Dictated by:   Marcie Bal Troncale, P.A.C. Admit Date:  02/08/2002 Discharge Date: 02/12/2002   CC:         Philips J. Montez Morita, M.D.   Discharge Summary  PRIMARY DIAGNOSIS: Intractable postoperative pain, low back and left leg.  SECONDARY DIAGNOSES: 1. Status post bilateral hemilaminectomy with foraminotomy L4 performed    on 01/06/2002. 2. History of migraine headaches. 3. History of chronic bronchitis. 4. History of pneumonia. 5. History of nephrolithiasis. 6. Osteoporosis.  SURGICAL PROCEDURE THIS ADMISSION:  None.  CONSULTATIONS:  None.  LABORATORY DATA: VENOUS DOPPLER, bilateral lower extremities was obtained on 02/09/02 which showed no evidence of deep venous thrombosis, superficial thrombus or Bakers cyst.  CBC:  Admission hemoglobin and hematocrit were 9.9 and 30.0 respectively.  SED RATE:  23 early on 02/09/02 and then 31 late on 02/09/02.  CHEMISTRIES:  Routine chemistry preoperatively showed low potassium at 3.2, sodium was 140, BUN and creatinine were 3 and 0.5 respectively.  On 02/11/02 potassium had improved to 3.9 and the sodium remained within normal limits at 139, creatinine 0.5.  C-reactive protein early on 02/09/02 was 0.7, later in the day on 02/09/02 was 1.3.  STREPTOCOCCAL SCREEN:  Negative on 02/11/02.  ELECTROCARDIOGRAM:  Patient had an electrocardiogram on 02/09/2002 that showed normal sinus rhythm and normal electrocardiogram.  CHEST X-RAY:  Showed no acute disease on 02/09/2002.  MAGNETIC RESONANCE IMAGING:  Showed no evidence of discitis or osteomyelitis. There were postoperative changes and a subcutaneous fluid collection superficial to the fascia.  There were no definite findings suggestive of communication between the subarachnoid space.  The  collection was felt to represent a seroma.  CHIEF COMPLAINT:  Continued back pain and leg pain postoperatively.  HISTORY OF PRESENT ILLNESS:  The patient is a Ellen Anderson who recently underwent bilateral hemilaminectomy and lateral decompressive foraminotomy at L4 bilaterally by Dr. Shelle Iron.  This procedure was performed on 01/06/2002.  She did well during her postoperative course, did run a little low grade temperature which was felt to be secondary to atelectasis.  She was discharged home and did well initially during her postoperative period.  About one week ago she began developing back spasms and increasing pain in the back and left leg.  Due to the significant increase in her pain she presented to the acute care clinic with Dr. Montez Morita.  In the absence of Dr. Shelle Iron, Dr. Montez Morita felt it would be best to admit the patient for pain control and rule out a recurrent disc or possibly discitis/abscess or infection.  The patient was admitted to [facility] on 02/08/2002.  HOSPITAL COURSE:  The patient did well during her hospital stay.  Doppler study came back negative for deep venous thrombosis.  The magnetic resonance imaging study was consistent with a seroma fluid collection but no evidence of discitis.  She reported an improvement in pain control while in the hospital. She also complained of a sore throat for which Streptococcal screen was ordered and came back negative.  The patient was started on Levaquin 500 mg q.d. empirically on 02/11/02.  The patient did well with physical therapy without any difficulties.  By 02/12/02 the laboratory studies had all come back essentially negative for evidence of infection or discitis.  The pain was  much improved and she was felt ready for discharge home.  DISPOSITION:  Patient is being discharged home.  DIET:  As tolerated.  FOLLOW UP:  Follow up with Dr. Shelle Iron later in the week.  She is to call [phone] for an appointment.  DISCHARGE  MEDICATIONS: 1. Mepergan fortis for which she already has a prescription. 2. Valium.  ACTIVITY:  Weight-bearing as tolerated with the assistance of a walker. No bending, stooping or squatting.  CONDITION ON DISCHARGE:  Good and improved. Dictated by:   Marcie Bal Troncale, P.A.C. Attending Physician:  Rocky Crafts DD:  02/27/02 TD:  02/28/02 Job: [id number]

## 2011-12-25 ENCOUNTER — Encounter (HOSPITAL_COMMUNITY): Payer: Self-pay

## 2011-12-25 ENCOUNTER — Emergency Department (HOSPITAL_COMMUNITY)
Admission: EM | Admit: 2011-12-25 | Discharge: 2011-12-25 | Disposition: A | Discharge status: Home / Self Care | Payer: Medicare Other | Attending: Emergency Medicine | Admitting: Emergency Medicine

## 2011-12-25 ENCOUNTER — Emergency Department (HOSPITAL_COMMUNITY): Payer: Medicare Other

## 2011-12-25 DIAGNOSIS — M79609 Pain in unspecified limb: Secondary | ICD-10-CM | POA: Insufficient documentation

## 2011-12-25 DIAGNOSIS — Y92009 Unspecified place in unspecified non-institutional (private) residence as the place of occurrence of the external cause: Secondary | ICD-10-CM | POA: Insufficient documentation

## 2011-12-25 DIAGNOSIS — Z9079 Acquired absence of other genital organ(s): Secondary | ICD-10-CM | POA: Insufficient documentation

## 2011-12-25 DIAGNOSIS — M25539 Pain in unspecified wrist: Secondary | ICD-10-CM | POA: Insufficient documentation

## 2011-12-25 DIAGNOSIS — Z7982 Long term (current) use of aspirin: Secondary | ICD-10-CM | POA: Insufficient documentation

## 2011-12-25 DIAGNOSIS — Z9889 Other specified postprocedural states: Secondary | ICD-10-CM | POA: Insufficient documentation

## 2011-12-25 DIAGNOSIS — W19XXXA Unspecified fall, initial encounter: Secondary | ICD-10-CM

## 2011-12-25 DIAGNOSIS — M545 Low back pain, unspecified: Secondary | ICD-10-CM | POA: Insufficient documentation

## 2011-12-25 DIAGNOSIS — W108XXA Fall (on) (from) other stairs and steps, initial encounter: Secondary | ICD-10-CM | POA: Insufficient documentation

## 2011-12-25 DIAGNOSIS — M549 Dorsalgia, unspecified: Secondary | ICD-10-CM

## 2011-12-25 DIAGNOSIS — M25569 Pain in unspecified knee: Secondary | ICD-10-CM | POA: Insufficient documentation

## 2011-12-25 DIAGNOSIS — M25579 Pain in unspecified ankle and joints of unspecified foot: Secondary | ICD-10-CM | POA: Insufficient documentation

## 2011-12-25 MED ORDER — KETOROLAC TROMETHAMINE 30 MG/ML IJ SOLN
30.0000 mg | Freq: Once | INTRAMUSCULAR | Status: AC
  Administered 2011-12-25: 30 mg via INTRAMUSCULAR
  Filled 2011-12-25: qty 1

## 2011-12-25 NOTE — ED Notes (Signed)
Fell app 30 min ago, slipped on ice when getting into her car.  Fell backwards.  Pain rt hand and lt foot /ankle and lt knee.Back pain also. Denies neck pain, No loc. No swelling .

## 2011-12-25 NOTE — ED Notes (Signed)
Pt used call bell and said her head felt funny, that she was going to faint.  When I went into room , pt was lying with eyes closed.  When I touched her arm , she jumped.  Says her head does not feel normal.  Pupils equal.  Answer questions normally,  V/s are normal.

## 2011-12-25 NOTE — ED Notes (Signed)
Ice pack to ankle and elevated on pillow. Good dp pulse on lt

## 2011-12-25 NOTE — ED Notes (Signed)
Multiple attempts to offer pt a w/c at this time also pt advocate offered to take out pt with w/c  Pt responded to just get out of her way and to let her do it on her on. Ellen Anderson

## 2011-12-25 NOTE — ED Notes (Signed)
Pt was leaving house and "fell outside in snow" pt c/o back pain and rt hand pain. Pt states, " I fell backwards on ground" denies loc.

## 2011-12-25 NOTE — ED Provider Notes (Signed)
History   This chart was scribed for Ellen Cooper III, MD by Clarita Crane. The patient was seen in room APA06/APA06 and the patient's care was started at 11:37AM.   CSN: [id number]  Arrival date & time 12/25/11  1031   First MD Initiated Contact with Patient 12/25/11 1124      Chief Complaint  Patient presents with  . Fall    (Consider location/radiation/quality/duration/timing/severity/associated sxs/prior treatment) HPI Ellen Anderson is a 66 y.o. female who presents to the Emergency Department to be evaluated after sustaining a fall this morning while walking down a set of steps, slipping on ice/snow and striking her back and bilateral knees and steps. Patient currently c/o moderate to severe lower back pain described as a spasm, bilateral knee pain, left ankle pain and right upper extremity pain at level of elbow extending to the right hand onset following the sustained fall this morning. Denies abdominal pain, chest pain, head injury, LOC. Patient with h/o back surgery, cholecystectomy and appendectomy.   History reviewed. No pertinent past medical history.  Past Surgical History  Procedure Date  . Back surgery   . Ankle surgery   . Abdominal hysterectomy   . Cholecystectomy   . Appendectomy     No family history on file.  History  Substance Use Topics  . Smoking status: Never Smoker   . Smokeless tobacco: Not on file  . Alcohol Use: No    OB History    Grav Para Term Preterm Abortions TAB SAB Ect Mult Living                  Review of Systems 10 Systems reviewed and are negative for acute change except as noted in the HPI.  Allergies  Augmentin; Ceclor; Morphine and related; Sulfur; Ivp dye; Methadone; Percocet; Codeine; and Lexapro  Home Medications   Current Outpatient Rx  Name Route Sig Dispense Refill  . ASPIRIN 325 MG PO TABS Oral Take 650 mg by mouth every 6 (six) hours as needed. For pain      BP 135/67  Pulse 67  Temp(Src) 97.3 F (36.3 C)  (Oral)  Resp 20  Ht 5\' 3"  (1.6 m)  Wt 195 lb (88.451 kg)  BMI 34.54 kg/m2  SpO2 97%  Physical Exam  Nursing note and vitals reviewed. Constitutional: She is oriented to person, place, and time. She appears well-developed and well-nourished. No distress.  HENT:  Head: Normocephalic and atraumatic.  Eyes: EOM are normal. Pupils are equal, round, and reactive to light.  Neck: Neck supple. No tracheal deviation present.  Cardiovascular: Normal rate and regular rhythm.  Exam reveals no gallop and no friction rub.   No murmur heard. Pulmonary/Chest: Effort normal. No respiratory distress. She has no wheezes. She has no rales.  Abdominal: Soft. Bowel sounds are normal. She exhibits no distension. There is no tenderness.  Musculoskeletal: Normal range of motion. She exhibits tenderness. She exhibits no edema.       Bilateral knees tender to palpation. Scar noted to lower back c/w patient reported surgery. Entire spine non-tender to palpation.   Neurological: She is alert and oriented to person, place, and time. No sensory deficit.  Skin: Skin is warm and dry.  Psychiatric: She has a normal mood and affect. Her behavior is normal.    ED Course  Procedures (including critical care time)  DIAGNOSTIC STUDIES: Oxygen Saturation is 96% on room air, normal by my interpretation.    COORDINATION OF CARE: 11:41AM- Patient explained current  plan of treatment and agrees with plan set forth at this time.    Labs Reviewed - No data to display Dg Lumbar Spine Complete  12/25/2011  *RADIOLOGY REPORT*  Clinical Data:  Fall on snow  LUMBAR SPINE - COMPLETE 4+ VIEW  Comparison: None.  Findings: Bones appear osteopenic.  Normal alignment of the lumbar spine.  There are compression fractures involving L2-L5.  Vertebral plasty has been performed at L3-L4 and L5. The L3-L5 compression deformities appears stable.  Compared with the most recent imaging from 12/26/2004 there is a new compression fracture at L2.   This is age indeterminate.  IMPRESSION:  1.  Multilevel lumbar compression deformities.  When compared with 12/26/2004 there is been interval compression of the L2 vertebra. The other levels have been treated with vertebroplasty.  Per CMS PQRS reporting requirements (PQRS Measure 24): Given the patient's age of greater than 50 and the fracture site (hip, distal radius, or spine), the patient should be tested for osteoporosis using DXA, and the appropriate treatment considered based on the DXA results.  Original Report Authenticated By: Rosealee Albee, M.D.   Dg Wrist Complete Right  12/25/2011  *RADIOLOGY REPORT*  Clinical Data: Fall.  Right wrist pain.  RIGHT WRIST - COMPLETE 3+ VIEW  Comparison: 12/25/2011  Findings: Distal radial deformity is observed.  Linear lucency extending to an irregular distal radial articular surface near the junction of the scaphoid and lunate could represent a nondisplaced fracture superimposed on the underlying deformity.  Prior distal ulnar resorption noted.  No additional fracture is identified.  IMPRESSION:  1.  Deformity of the distal radius.  There is some linear lucency in the distal radius which is indistinct and probably related to the old fracture.  Nondisplaced distal radial fracture extending to the distal articular surface cannot be totally excluded, and if the patient is point tender along the distal radius then CT or MRI would be recommended for further workup. 2.  Prior distal ulnar resorption. 3.  Bony demineralization.  Original Report Authenticated By: Dellia Cloud, M.D.   Dg Ankle Complete Left  12/25/2011  *RADIOLOGY REPORT*  Clinical Data: Fall.  Left ankle pain.  LEFT ANKLE COMPLETE - 3+ VIEW  Comparison: None.  Findings: Lag screw fixation of the medial malleolus with prior plate screw fixation of the lateral malleolus noted.  There is spurring between the medial malleolus and the talus.  Bony demineralization is present.  No acute fracture is  observed.  Mild narrowing of the mortise noted.  Slightly irregular dorsal spurring at the talonavicular articulation noted.  IMPRESSION:  1.  Prior internal fixation of malleolar fractures.  No acute fracture is observed. 2.  Spurring between the medial malleolus and the talus. 3.  Narrowing of the mortise. 4.  Dorsal talonavicular spurring. 5.  No acute fracture is observed.  Original Report Authenticated By: Dellia Cloud, M.D.   Dg Knee Complete 4 Views Left  12/25/2011  *RADIOLOGY REPORT*  Clinical Data: Fall.  Left knee pain.  LEFT KNEE - COMPLETE 4+ VIEW  Comparison: Multiple exams, including 01/28/2005 and 01/23/2005  Findings: Bony demineralization noted along with mild medial compartmental articular space narrowing.  Mild prepatellar edema noted.  There is mild patellofemoral spurring.  No fracture is observed.  IMPRESSION:  1.  Bony demineralization. 2.  Osteoarthritis. 3.  Mild prepatellar edema.  Original Report Authenticated By: Dellia Cloud, M.D.   Dg Knee Complete 4 Views Right  12/25/2011  *RADIOLOGY REPORT*  Clinical Data:  Fall.  Right knee pain.  RIGHT KNEE - COMPLETE 4+ VIEW  Comparison: Report from exam dated 08/15/2002  Findings: Bony demineralization is present. Degenerative loss of medial articular space noted.  No knee effusion is observed.  No fracture is identified.  IMPRESSION:  1.  Bony demineralization. 2.  Mild medial compartmental degenerative articular space loss.  Original Report Authenticated By: Dellia Cloud, M.D.   Dg Hand Complete Right  12/25/2011  *RADIOLOGY REPORT*  Clinical Data: Fall.  Right hand pain.  RIGHT HAND - COMPLETE 3+ VIEW  Comparison: None.  Findings: The bony demineralization noted.  There is evidence of osteoarthritis.  Deformity and absence of the distal ulna noted with deformity the distal radius, likely related to prior fracture and distal ulnar resorption.  No acute fracture is observed.  IMPRESSION:  1.  Deformity of the  distal radius with resorption of the distal ulna, likely from prior injury. 2.  Bony demineralization. 3.  Osteomyelitis. 4.  No acute fracture is identified.  Original Report Authenticated By: Dellia Cloud, M.D.     1. Fall   2. Back pain           I personally performed the services described in this documentation, which was scribed in my presence. The recorded information has been reviewed and considered.  Osvaldo Human, M.D.   Ellen Cooper III, MD 12/25/11 [phone]

## 2011-12-25 NOTE — ED Notes (Signed)
Pt had gotten  Off stretcher with side rails up and gotten dressed.  Walking with limp and bare lt foot,  Repeatedly refuses wheelchair and says "leave me alone"Walking out with brother in law.  I had told her to wait for her x-ray results, but pt says, "Call me if I'm going to die"

## 2011-12-25 NOTE — ED Notes (Signed)
Dr. Ignacia Palma aware that pt requesting pain med.  Had requested Demerol

## 2011-12-25 NOTE — ED Notes (Signed)
Returned from xray

## 2011-12-25 NOTE — ED Notes (Signed)
Patient transported to X-ray 

## 2011-12-26 ENCOUNTER — Emergency Department (HOSPITAL_COMMUNITY): Payer: Medicare Other

## 2011-12-26 ENCOUNTER — Encounter (HOSPITAL_COMMUNITY): Payer: Self-pay | Admitting: *Deleted

## 2011-12-26 ENCOUNTER — Inpatient Hospital Stay (HOSPITAL_COMMUNITY)
Admission: EM | Admit: 2011-12-26 | Discharge: 2011-12-29 | DRG: 101 | Disposition: A | Discharge status: Home / Self Care | Payer: Medicare Other | Attending: Family Medicine | Admitting: Family Medicine

## 2011-12-26 ENCOUNTER — Other Ambulatory Visit: Payer: Self-pay

## 2011-12-26 DIAGNOSIS — R569 Unspecified convulsions: Principal | ICD-10-CM | POA: Diagnosis present

## 2011-12-26 DIAGNOSIS — G8929 Other chronic pain: Secondary | ICD-10-CM | POA: Diagnosis present

## 2011-12-26 DIAGNOSIS — N390 Urinary tract infection, site not specified: Secondary | ICD-10-CM | POA: Diagnosis present

## 2011-12-26 DIAGNOSIS — D649 Anemia, unspecified: Secondary | ICD-10-CM | POA: Diagnosis present

## 2011-12-26 DIAGNOSIS — Z9181 History of falling: Secondary | ICD-10-CM

## 2011-12-26 DIAGNOSIS — M47817 Spondylosis without myelopathy or radiculopathy, lumbosacral region: Secondary | ICD-10-CM | POA: Diagnosis present

## 2011-12-26 LAB — URINE MICROSCOPIC-ADD ON

## 2011-12-26 LAB — URINALYSIS, ROUTINE W REFLEX MICROSCOPIC
Ketones, ur: NEGATIVE mg/dL
Leukocytes, UA: NEGATIVE
Nitrite: POSITIVE — AB
Protein, ur: NEGATIVE mg/dL
Urobilinogen, UA: 0.2 mg/dL (ref 0.0–1.0)
pH: 5.5 (ref 5.0–8.0)

## 2011-12-26 LAB — COMPREHENSIVE METABOLIC PANEL
Alkaline Phosphatase: 92 U/L (ref 39–117)
BUN: 7 mg/dL (ref 6–23)
GFR calc Af Amer: >90 mL/min (ref >90)
GFR calc non Af Amer: >90 mL/min (ref >90)
Glucose, Bld: 83 mg/dL (ref 70–99)
Potassium: 3.5 mEq/L (ref 3.5–5.1)
Total Bilirubin: 0.2 mg/dL — ABNORMAL LOW (ref 0.3–1.2)
Total Protein: 6.2 g/dL (ref 6.0–8.3)

## 2011-12-26 LAB — TROPONIN I: Troponin I: <0.3 ng/mL (ref <0.30)

## 2011-12-26 LAB — CBC
HCT: 33.2 % — ABNORMAL LOW (ref 36.0–46.0)
Hemoglobin: 11.1 g/dL — ABNORMAL LOW (ref 12.0–15.0)
MCHC: 33.4 g/dL (ref 30.0–36.0)

## 2011-12-26 LAB — URINE CULTURE

## 2011-12-26 LAB — SALICYLATE LEVEL: Salicylate Lvl: <2 mg/dL — ABNORMAL LOW (ref 2.8–20.0)

## 2011-12-26 MED ORDER — ACETAMINOPHEN 325 MG PO TABS
650.0000 mg | ORAL_TABLET | Freq: Four times a day (QID) | ORAL | Status: DC | PRN
  Filled 2011-12-26: qty 2

## 2011-12-26 MED ORDER — LEVETIRACETAM 500 MG/5ML IV SOLN
INTRAVENOUS | Status: AC
  Filled 2011-12-26: qty 5

## 2011-12-26 MED ORDER — CIPROFLOXACIN IN D5W 400 MG/200ML IV SOLN
400.0000 mg | Freq: Two times a day (BID) | INTRAVENOUS | Status: DC
  Administered 2011-12-26 – 2011-12-29 (×6): 400 mg via INTRAVENOUS
  Filled 2011-12-26 (×7): qty 200

## 2011-12-26 MED ORDER — ALBUTEROL SULFATE (5 MG/ML) 0.5% IN NEBU: 2.5000 mg | INHALATION_SOLUTION | RESPIRATORY_TRACT | Status: DC | PRN

## 2011-12-26 MED ORDER — SODIUM CHLORIDE 0.9 % IV SOLN
500.0000 mg | Freq: Two times a day (BID) | INTRAVENOUS | Status: DC
  Administered 2011-12-26 – 2011-12-29 (×6): 500 mg via INTRAVENOUS
  Filled 2011-12-26 (×6): qty 5

## 2011-12-26 MED ORDER — SODIUM CHLORIDE 0.9 % IV BOLUS (SEPSIS)
1000.0000 mL | Freq: Once | INTRAVENOUS | Status: AC
  Administered 2011-12-26: 1000 mL via INTRAVENOUS

## 2011-12-26 MED ORDER — ONDANSETRON HCL 4 MG/2ML IJ SOLN: 4.0000 mg | Freq: Four times a day (QID) | INTRAMUSCULAR | Status: DC | PRN

## 2011-12-26 MED ORDER — SODIUM CHLORIDE 0.9 % IJ SOLN
3.0000 mL | Freq: Two times a day (BID) | INTRAMUSCULAR | Status: DC
  Administered 2011-12-26 – 2011-12-28 (×5): 3 mL via INTRAVENOUS
  Filled 2011-12-26 (×6): qty 3

## 2011-12-26 MED ORDER — DIAZEPAM 5 MG PO TABS
5.0000 mg | ORAL_TABLET | Freq: Three times a day (TID) | ORAL | Status: DC
  Administered 2011-12-26: 5 mg via ORAL
  Filled 2011-12-26 (×2): qty 1

## 2011-12-26 MED ORDER — CIPROFLOXACIN IN D5W 400 MG/200ML IV SOLN
400.0000 mg | Freq: Once | INTRAVENOUS | Status: AC
  Administered 2011-12-26: 400 mg via INTRAVENOUS
  Filled 2011-12-26: qty 200

## 2011-12-26 MED ORDER — LORAZEPAM 2 MG/ML IJ SOLN: 1.0000 mg | INTRAMUSCULAR | Status: DC | PRN

## 2011-12-26 MED ORDER — ONDANSETRON HCL 4 MG PO TABS: 4.0000 mg | ORAL_TABLET | Freq: Four times a day (QID) | ORAL | Status: DC | PRN

## 2011-12-26 MED ORDER — HYDROMORPHONE HCL PF 1 MG/ML IJ SOLN
0.5000 mg | Freq: Three times a day (TID) | INTRAMUSCULAR | Status: DC | PRN
  Administered 2011-12-26 – 2011-12-28 (×7): 0.5 mg via INTRAVENOUS
  Filled 2011-12-26 (×7): qty 1

## 2011-12-26 MED ORDER — DIAZEPAM 5 MG PO TABS
5.0000 mg | ORAL_TABLET | Freq: Once | ORAL | Status: AC
  Administered 2011-12-26: 5 mg via ORAL

## 2011-12-26 MED ORDER — DIAZEPAM 5 MG PO TABS
10.0000 mg | ORAL_TABLET | Freq: Two times a day (BID) | ORAL | Status: DC
  Administered 2011-12-26 – 2011-12-29 (×6): 10 mg via ORAL
  Filled 2011-12-26 (×7): qty 2

## 2011-12-26 MED ORDER — SODIUM CHLORIDE 0.9 % IV SOLN
Freq: Once | INTRAVENOUS | Status: AC
  Administered 2011-12-26: 1000 mL via INTRAVENOUS

## 2011-12-26 MED ORDER — SODIUM CHLORIDE 0.9 % IV SOLN
1000.0000 mg | Freq: Once | INTRAVENOUS | Status: AC
  Administered 2011-12-26: 1000 mg via INTRAVENOUS
  Filled 2011-12-26: qty 10

## 2011-12-26 MED ORDER — SODIUM CHLORIDE 0.9 % IJ SOLN
INTRAMUSCULAR | Status: AC
  Administered 2011-12-26: 10 mL
  Filled 2011-12-26: qty 3

## 2011-12-26 MED ORDER — ACETAMINOPHEN 650 MG RE SUPP: 650.0000 mg | Freq: Four times a day (QID) | RECTAL | Status: DC | PRN

## 2011-12-26 NOTE — Plan of Care (Signed)
Problem: Consults Goal: Neurology consult Outcome: Completed/Met Date Met:  12/26/11 Consult wrote in the consult book to be called to Dr. Gerilyn Pilgrim with Neurology.

## 2011-12-26 NOTE — Plan of Care (Signed)
Problem: Phase I Progression Outcomes Goal: Seizure activity controlled Outcome: Progressing Pt has had no seizures at this time.

## 2011-12-26 NOTE — Progress Notes (Addendum)
Notified Dr. Fransico Him to discuss patients request for pain management using the mepragan/demorl combonation shot.  I voiced to him I had attempted and so had Dr. Rito Ehrlich to explain that these meds are no longer given on the floor, but she insist this is the medication that she wants.  I suggested to him the dialudid just in case she changes her mind since she rates her pain at a 12 constant.  New orders given and followed.   Went into the room and explained what me and Dr. Fransico Him had discussed.  She voices that Dr. Renard Matter had just left her room and told her that this med was not given on the floor, she stated that she would try the dialudid.  I voiced to her that this med is potent and it would be good for pain at her level.  I will continue to monitor.

## 2011-12-26 NOTE — ED Notes (Signed)
Pt complaining of leg pain. Pt legs propped up on pillows. Ps states that is better at this time.

## 2011-12-26 NOTE — Progress Notes (Signed)
Notified Dr. Fransico Him due to patient stating that 5mg  of valium was not enough to cover her back spasms.  She suggested 10mg , and I referred this to MD. New orders given and followed.

## 2011-12-26 NOTE — ED Notes (Signed)
AC called for medication 

## 2011-12-26 NOTE — ED Provider Notes (Signed)
History     CSN: [id number]  Arrival date & time 12/26/11  0230   First MD Initiated Contact with Patient 12/26/11 [phone]      Chief Complaint  Patient presents with  . Seizures    (Consider location/radiation/quality/duration/timing/severity/associated sxs/prior treatment) Patient is a 66 y.o. female presenting with seizures. The history is provided by the EMS personnel and a relative.  Seizures  This is a new problem. The current episode started less than 1 hour ago. The problem has not changed since onset.There was 1 seizure. The most recent episode lasted more than 5 minutes. Characteristics include eye blinking, rhythmic jerking and loss of consciousness. The seizure(s) had no focality. Possible causes do not include recent illness or change in alcohol use. There has been no fever.  at home tonight with son and daughter-in-law who witnessed seizure like activity. Patient was standing up and noted to be unresponsive with her arm shaking and eyes blinking. They helped to the floor and she continued this activity and would not respond to either of them. EMS was called symptoms lasted 7-10 minutes and continue the EMS. Medics gave Ativan 2 mg IV with some change in symptoms but they're still persisting on arrival to emergency department. Another milligram of Ativan was given in the ER and symptoms resolved. Patient very somnolent and unable to provide any history on arrival. Level V caveat applies. Family report no history of seizures. No new medications. Patient has been taking aspirin today after fall where she injured her knees and ankles evaluated here earlier for this. They report x-rays performed but did not have any broken bones.family does not believe patient drinks alcohol or uses any drugs. No fevers, neck pain or sick contacts at home  History reviewed. No pertinent past medical history.  Past Surgical History  Procedure Date  . Back surgery   . Ankle surgery   . Abdominal  hysterectomy   . Cholecystectomy   . Appendectomy     History reviewed. No pertinent family history.  History  Substance Use Topics  . Smoking status: Never Smoker   . Smokeless tobacco: Not on file  . Alcohol Use: No    OB History    Grav Para Term Preterm Abortions TAB SAB Ect Mult Living                  Review of Systems  Neurological: Positive for loss of consciousness.  All other systems reviewed and are negative.  level V caveat applies as above  Allergies  Augmentin; Ceclor; Morphine and related; Sulfur; Ivp dye; Methadone; Percocet; Codeine; and Lexapro  Home Medications   Current Outpatient Rx  Name Route Sig Dispense Refill  . ASPIRIN 325 MG PO TABS Oral Take 650 mg by mouth every 6 (six) hours as needed. For pain      BP 123/70  Pulse 69  Temp(Src) 97.9 F (36.6 C) (Oral)  Resp 20  Ht 5\' 6"  (1.676 m)  Wt 195 lb (88.451 kg)  BMI 31.47 kg/m2  SpO2 97%  Physical Exam  Constitutional: She appears well-developed and well-nourished.  HENT:  Head: Normocephalic and atraumatic.  Eyes: Conjunctivae are normal. Pupils are equal, round, and reactive to light.  Neck: Trachea normal. No JVD present. No tracheal deviation present.  Cardiovascular: Normal rate, regular rhythm, S1 normal, S2 normal and normal pulses.     No systolic murmur is present   No diastolic murmur is present  Pulses:      Radial pulses  are 2+ on the right side, and 2+ on the left side.  Pulmonary/Chest: Effort normal and breath sounds normal. No stridor. She has no wheezes. She has no rhonchi. She has no rales.  Abdominal: Soft. Normal appearance and bowel sounds are normal. There is no CVA tenderness and negative Murphy's sign.  Musculoskeletal:       BLE:s Calves nontender, no cords or erythema, negative Homans sign  Neurological: She has normal strength. No sensory deficit. GCS eye subscore is 4. GCS verbal subscore is 5. GCS motor subscore is 6.       Opens eyes to verbal  stimuli, nonverbal, does not follow commands  Skin: Skin is warm and dry. No rash noted.  Psychiatric: Her speech is normal.       Cooperative and appropriate    ED Course  Procedures (including critical care time)  Labs Reviewed  CBC - Abnormal; Notable for the following:    RBC 3.76 (*)    Hemoglobin 11.1 (*)    HCT 33.2 (*)    All other components within normal limits  COMPREHENSIVE METABOLIC PANEL - Abnormal; Notable for the following:    Calcium 8.2 (*)    Albumin 3.0 (*)    Total Bilirubin 0.2 (*)    All other components within normal limits  URINALYSIS, ROUTINE W REFLEX MICROSCOPIC - Abnormal; Notable for the following:    Hgb urine dipstick SMALL (*)    Nitrite POSITIVE (*)    All other components within normal limits  URINE RAPID DRUG SCREEN (HOSP PERFORMED) - Abnormal; Notable for the following:    Benzodiazepines POSITIVE (*)    All other components within normal limits  SALICYLATE LEVEL - Abnormal; Notable for the following:    Salicylate Lvl <2.0 (*)    All other components within normal limits  URINE MICROSCOPIC-ADD ON - Abnormal; Notable for the following:    Bacteria, UA MANY (*)    All other components within normal limits  TROPONIN I  URINE CULTURE   Dg Lumbar Spine Complete  12/25/2011  *RADIOLOGY REPORT*  Clinical Data:  Fall on snow  LUMBAR SPINE - COMPLETE 4+ VIEW  Comparison: None.  Findings: Bones appear osteopenic.  Normal alignment of the lumbar spine.  There are compression fractures involving L2-L5.  Vertebral plasty has been performed at L3-L4 and L5. The L3-L5 compression deformities appears stable.  Compared with the most recent imaging from 12/26/2004 there is a new compression fracture at L2.  This is age indeterminate.  IMPRESSION:  1.  Multilevel lumbar compression deformities.  When compared with 12/26/2004 there is been interval compression of the L2 vertebra. The other levels have been treated with vertebroplasty.  Per CMS PQRS reporting  requirements (PQRS Measure 24): Given the patient's age of greater than 50 and the fracture site (hip, distal radius, or spine), the patient should be tested for osteoporosis using DXA, and the appropriate treatment considered based on the DXA results.  Original Report Authenticated By: Rosealee Albee, M.D.   Dg Wrist Complete Right  12/25/2011  *RADIOLOGY REPORT*  Clinical Data: Fall.  Right wrist pain.  RIGHT WRIST - COMPLETE 3+ VIEW  Comparison: 12/25/2011  Findings: Distal radial deformity is observed.  Linear lucency extending to an irregular distal radial articular surface near the junction of the scaphoid and lunate could represent a nondisplaced fracture superimposed on the underlying deformity.  Prior distal ulnar resorption noted.  No additional fracture is identified.  IMPRESSION:  1.  Deformity of the  distal radius.  There is some linear lucency in the distal radius which is indistinct and probably related to the old fracture.  Nondisplaced distal radial fracture extending to the distal articular surface cannot be totally excluded, and if the patient is point tender along the distal radius then CT or MRI would be recommended for further workup. 2.  Prior distal ulnar resorption. 3.  Bony demineralization.  Original Report Authenticated By: Dellia Cloud, M.D.   Dg Ankle Complete Left  12/25/2011  *RADIOLOGY REPORT*  Clinical Data: Fall.  Left ankle pain.  LEFT ANKLE COMPLETE - 3+ VIEW  Comparison: None.  Findings: Lag screw fixation of the medial malleolus with prior plate screw fixation of the lateral malleolus noted.  There is spurring between the medial malleolus and the talus.  Bony demineralization is present.  No acute fracture is observed.  Mild narrowing of the mortise noted.  Slightly irregular dorsal spurring at the talonavicular articulation noted.  IMPRESSION:  1.  Prior internal fixation of malleolar fractures.  No acute fracture is observed. 2.  Spurring between the medial  malleolus and the talus. 3.  Narrowing of the mortise. 4.  Dorsal talonavicular spurring. 5.  No acute fracture is observed.  Original Report Authenticated By: Dellia Cloud, M.D.   Ct Head Wo Contrast  12/26/2011  *RADIOLOGY REPORT*  Clinical Data: Eight slurred speech, altered mental status.  CT HEAD WITHOUT CONTRAST  Technique:  Contiguous axial images were obtained from the base of the skull through the vertex without contrast.  Comparison: None  Findings: No acute intracranial abnormality.  Specifically, no hemorrhage, hydrocephalus, mass lesion, acute infarction, or significant intracranial injury.  No acute calvarial abnormality. Visualized paranasal sinuses and mastoids clear.  Orbital soft tissues unremarkable.  IMPRESSION: No acute intracranial abnormality.  Original Report Authenticated By: Cyndie Chime, M.D.   Dg Chest Portable 1 View  12/26/2011  *RADIOLOGY REPORT*  Clinical Data: Pain.  PORTABLE CHEST - 1 VIEW  Comparison: None.  Findings: Heart is borderline in size.  Lungs are clear.  No effusions.  No acute bony abnormality.  IMPRESSION: No active disease.  Original Report Authenticated By: Cyndie Chime, M.D.   Dg Knee Complete 4 Views Left  12/25/2011  *RADIOLOGY REPORT*  Clinical Data: Fall.  Left knee pain.  LEFT KNEE - COMPLETE 4+ VIEW  Comparison: Multiple exams, including 01/28/2005 and 01/23/2005  Findings: Bony demineralization noted along with mild medial compartmental articular space narrowing.  Mild prepatellar edema noted.  There is mild patellofemoral spurring.  No fracture is observed.  IMPRESSION:  1.  Bony demineralization. 2.  Osteoarthritis. 3.  Mild prepatellar edema.  Original Report Authenticated By: Dellia Cloud, M.D.   Dg Knee Complete 4 Views Right  12/25/2011  *RADIOLOGY REPORT*  Clinical Data: Fall.  Right knee pain.  RIGHT KNEE - COMPLETE 4+ VIEW  Comparison: Report from exam dated 08/15/2002  Findings: Bony demineralization is present.  Degenerative loss of medial articular space noted.  No knee effusion is observed.  No fracture is identified.  IMPRESSION:  1.  Bony demineralization. 2.  Mild medial compartmental degenerative articular space loss.  Original Report Authenticated By: Dellia Cloud, M.D.   Dg Hand Complete Right  12/25/2011  *RADIOLOGY REPORT*  Clinical Data: Fall.  Right hand pain.  RIGHT HAND - COMPLETE 3+ VIEW  Comparison: None.  Findings: The bony demineralization noted.  There is evidence of osteoarthritis.  Deformity and absence of the distal ulna noted with deformity  the distal radius, likely related to prior fracture and distal ulnar resorption.  No acute fracture is observed.  IMPRESSION:  1.  Deformity of the distal radius with resorption of the distal ulna, likely from prior injury. 2.  Bony demineralization. 3.  Osteomyelitis. 4.  No acute fracture is identified.  Original Report Authenticated By: Dellia Cloud, M.D.    Patient with post ictal symptoms and presentation consistent with new-onset seizures. IV fluids, UA, labs, CT brain and chest x-ray. Patient treated for UTI with IV Cipro. Multiple allergies noted. At 4:30 AM patient started to wake up denies any memory of events tonight. She does have some substernal chest discomfort with reproducible tenderness on exam, no nausea or diaphoresis or shortness of breath. EKG was obtained and reviewed. Medicine consult for admit UTI and new onset seizures. Case discussed as above with hospitalist who agrees to admit   Date: 12/26/2011  Rate: 70  Rhythm: normal sinus rhythm  QRS Axis: normal  Intervals: normal  ST/T Wave abnormalities: nonspecific ST/T changes  Conduction Disutrbances:none  Narrative Interpretation: artifact present  Old EKG Reviewed: unchanged   MDM   New onset and prolonged seizure requiring multiple doses of Ativan. IV antibiotics for UTI. IV Keppra.         Sunnie Nielsen, MD 12/26/11 [phone]

## 2011-12-26 NOTE — ED Notes (Signed)
Attempted to call report. Was advised nurse receiving pt will call this nurse back for report. 

## 2011-12-26 NOTE — Progress Notes (Signed)
[name], Anderson                 ACCOUNT NO.:  [id number]  MEDICAL RECORD NO.:  [id number]  LOCATION:  A334                          FACILITY:  [facility]  PHYSICIAN:  Ansley Stanwood G. Renard Matter, MD   DATE OF BIRTH:  May 23, 1946  DATE OF PROCEDURE: DATE OF DISCHARGE:                                PROGRESS NOTE   This patient was admitted to the hospital after having previously slipped on ice with questionable loss of consciousness.  Apparently hurt her knees as well as a head at that time.  Apparently, she came to the emergency room, was evaluated and then subsequently discharged home. Then later family member noted she had developed shaking and trembling in arms and subsequent loss of consciousness.  She did not have any history of previous seizure activity, but ED physician did note that she had seizure activity, was given Ativan.  Has had no further seizures. The patient is complaining more of back pain now.  She had previous back surgery.  X-ray on this admission shows deformity of distal radius, questionable nondisplaced fracture.  Left ankle, no acute fracture.  CT of the head, no acute abnormalities.  Lumbar spine, compression deformities.  OBJECTIVE:  VITAL SIGNS:  Blood pressure 137/77, respirations 24, pulse 73, temp 97.5. LUNGS:  Clear to P and A. HEART:  Regular rhythm. ABDOMEN:  No palpable organs or masses. NEUROLOGICAL:  No gross abnormalities.  No focal deficits.  ASSESSMENT:  The patient apparently had seizure prior to admission with loss of consciousness.  She does have chronic back pain.  Does have multilevel lumbar compression deformities and interval compression deformity of L2.  Other levels treated by vertebroplasty.  PLAN:  To proceed with MRI.  We will obtain Neurology consult.  Continue Keppra.  We will add Valium at the patient's request.  We will continue to use Dilaudid for pain control.     Khamiyah Grefe G. Renard Matter, MD     AGM/MEDQ  D:  12/26/2011  T:   12/26/2011  Job:  [id number]

## 2011-12-26 NOTE — Progress Notes (Signed)
Went into the patients room because she had called.  The patient c/o pain in her knees and back.  I offered her Tylenol since the patient only had that at this time for pain.  She requested Demerol and per report  The patient also request Mepragan.  I discussed with her that Demerol is given on the floor as they use to.  I offered to call the MD and ask about Demorol but, otherwise suggest Dilaudid to her.  She refuses the med at this time and states that she will speak with her own MD about her pain issues.  I verbalized understanding and voiced to her to let me know if it is anything else I could do.  Will continue to monitor.

## 2011-12-26 NOTE — ED Notes (Signed)
Pt resting w/ eyes closed. Rise & fall of the chest noted. NAD noted at this time.

## 2011-12-26 NOTE — ED Notes (Signed)
Son carried pts clothing & necklace home as they left the ER.

## 2011-12-26 NOTE — ED Notes (Signed)
Family at bedside. Talking to pt. No needs at this time.

## 2011-12-26 NOTE — H&P (Signed)
Ellen Anderson is an 66 y.o. female.    PCP: Alice Reichert, MD, MD   Chief Complaint: Seizure  HPI: This is a 66 year old, Caucasian female, who does not have any chronic medical problems, who was in her usual state of health till yesterday morning when she apparently slipped on ice and fell. She tells me that she hurt her knees as well as her head. She thinks she may have lost consciousness. However, is not very sure. Patient is clearly postictal and is confused. In any case she came in to the ED yesterday. Was evaluated and she underwent multiple imaging studies. And, subsequently, she was discharged home.   And later last night she was standing in the bedroom talking to her daughter-in-law, when she suddenly noticed that her legs started shaking and then her arm started trembling, and subsequently, she started shaking all over. She lost consciousness. She was laid down flat on the floor. EMS was called. She was given Ativan. Apparently, she was shaking for a longer time. This part of the history was provided by the patient's son and daughter-in-law. Currently, she has a headache, but she is unable to describe it. She took 2 aspirins. She does not have any previous history of seizures. According to the ED physician she was noted to have seizure activity, even in the emergency department and was given more Ativan. Currently, patient is post ictal so history is limited.   Prior to Admission medications   Medication Sig Start Date End Date Taking? Authorizing Provider  aspirin 325 MG tablet Take 650 mg by mouth every 6 (six) hours as needed. For pain    Historical Provider, MD    Allergies:  Allergies  Allergen Reactions  . Augmentin Swelling    Of throat  . Ceclor (Cefaclor) Swelling    Of throat  . Morphine And Related Other (See Comments)    Causes heart to stop  . Sulfur Shortness Of Breath  . Ivp Dye (Iodinated Diagnostic Agents) Other (See Comments)    convulsions  . Methadone  Other (See Comments)    hallucinations  . Percocet (Oxycodone-Acetaminophen) Itching    Til she bleeds  . Codeine Rash  . Lexapro Other (See Comments)    hallucinations    History reviewed. No pertinent past medical history.  Past Surgical History  Procedure Date  . Back surgery   . Ankle surgery   . Abdominal hysterectomy   . Cholecystectomy   . Appendectomy     Social History:  reports that she has never smoked. She does not have any smokeless tobacco history on file. She reports that she does not drink alcohol or use illicit drugs.  Family History:  Family History  Problem Relation Age of Onset  . Throat cancer Mother   . COPD Father     Review of Systems - History obtained from unobtainable from patient due to mental status (post ictal state)  Physical Examination Blood pressure 120/70, pulse 76, temperature 97.9 F (36.6 C), temperature source Oral, resp. rate 20, height 5\' 6"  (1.676 m), weight 88.451 kg (195 lb), SpO2 98.00%.  General appearance: alert, cooperative, appears stated age and no distress Head: Normocephalic, without obvious abnormality, atraumatic Eyes: negative Throat: lips, mucosa, and tongue normal; teeth and gums normal Neck: no adenopathy, no carotid bruit, no JVD, supple, symmetrical, trachea midline and thyroid not enlarged, symmetric, no tenderness/mass/nodules Back: symmetric, no curvature. ROM normal. No CVA tenderness. Resp: clear to auscultation bilaterally Cardio: regular rate and  rhythm, S1, S2 normal, no murmur, click, rub or gallop GI: soft, non-tender; bowel sounds normal; no masses,  no organomegaly Extremities: extremities normal, atraumatic, no cyanosis or edema Pulses: 2+ and symmetric Skin: Skin color, texture, turgor normal. No rashes or lesions Lymph nodes: Cervical, supraclavicular, and axillary nodes normal. Neurologic: Grossly normal. No focal deficits.  Results for orders placed during the hospital encounter of 12/26/11  (from the past 48 hour(s))  URINALYSIS, ROUTINE W REFLEX MICROSCOPIC     Status: Abnormal   Collection Time   12/26/11  2:59 AM      Component Value Range Comment   Color, Urine YELLOW  YELLOW     APPearance CLEAR  CLEAR     Specific Gravity, Urine 1.025  1.005 - 1.030     pH 5.5  5.0 - 8.0     Glucose, UA NEGATIVE  NEGATIVE (mg/dL)    Hgb urine dipstick SMALL (*) NEGATIVE     Bilirubin Urine NEGATIVE  NEGATIVE     Ketones, ur NEGATIVE  NEGATIVE (mg/dL)    Protein, ur NEGATIVE  NEGATIVE (mg/dL)    Urobilinogen, UA 0.2  0.0 - 1.0 (mg/dL)    Nitrite POSITIVE (*) NEGATIVE     Leukocytes, UA NEGATIVE  NEGATIVE    URINE RAPID DRUG SCREEN (HOSP PERFORMED)     Status: Abnormal   Collection Time   12/26/11  2:59 AM      Component Value Range Comment   Opiates NONE DETECTED  NONE DETECTED     Cocaine NONE DETECTED  NONE DETECTED     Benzodiazepines POSITIVE (*) NONE DETECTED     Amphetamines NONE DETECTED  NONE DETECTED     Tetrahydrocannabinol NONE DETECTED  NONE DETECTED     Barbiturates NONE DETECTED  NONE DETECTED    URINE MICROSCOPIC-ADD ON     Status: Abnormal   Collection Time   12/26/11  2:59 AM      Component Value Range Comment   Squamous Epithelial / LPF RARE  RARE     WBC, UA 7-10  <3 (WBC/hpf)    RBC / HPF 0-2  <3 (RBC/hpf)    Bacteria, UA MANY (*) RARE    CBC     Status: Abnormal   Collection Time   12/26/11  3:03 AM      Component Value Range Comment   WBC 4.9  4.0 - 10.5 (K/uL)    RBC 3.76 (*) 3.87 - 5.11 (MIL/uL)    Hemoglobin 11.1 (*) 12.0 - 15.0 (g/dL)    HCT 40.9 (*) 81.1 - 46.0 (%)    MCV 88.3  78.0 - 100.0 (fL)    MCH 29.5  26.0 - 34.0 (pg)    MCHC 33.4  30.0 - 36.0 (g/dL)    RDW 91.4  78.2 - 95.6 (%)    Platelets 224  150 - 400 (K/uL)   COMPREHENSIVE METABOLIC PANEL     Status: Abnormal   Collection Time   12/26/11  3:03 AM      Component Value Range Comment   Sodium 138  135 - 145 (mEq/L)    Potassium 3.5  3.5 - 5.1 (mEq/L)    Chloride 105  96 - 112  (mEq/L)    CO2 29  19 - 32 (mEq/L)    Glucose, Bld 83  70 - 99 (mg/dL)    BUN 7  6 - 23 (mg/dL)    Creatinine, Ser 2.13  0.50 - 1.10 (mg/dL)    Calcium 8.2 (*)  8.4 - 10.5 (mg/dL)    Total Protein 6.2  6.0 - 8.3 (g/dL)    Albumin 3.0 (*) 3.5 - 5.2 (g/dL)    AST 15  0 - 37 (U/L)    ALT 7  0 - 35 (U/L)    Alkaline Phosphatase 92  39 - 117 (U/L)    Total Bilirubin 0.2 (*) 0.3 - 1.2 (mg/dL)    GFR calc non Af Amer >90  >90 (mL/min)    GFR calc Af Amer >90  >90 (mL/min)   SALICYLATE LEVEL     Status: Abnormal   Collection Time   12/26/11  3:03 AM      Component Value Range Comment   Salicylate Lvl <2.0 (*) 2.8 - 20.0 (mg/dL)   TROPONIN I     Status: Normal   Collection Time   12/26/11  3:03 AM      Component Value Range Comment   Troponin I <0.30  <0.30 (ng/mL)    Dg Lumbar Spine Complete  12/25/2011  *RADIOLOGY REPORT*  Clinical Data:  Fall on snow  LUMBAR SPINE - COMPLETE 4+ VIEW  Comparison: None.  Findings: Bones appear osteopenic.  Normal alignment of the lumbar spine.  There are compression fractures involving L2-L5.  Vertebral plasty has been performed at L3-L4 and L5. The L3-L5 compression deformities appears stable.  Compared with the most recent imaging from 12/26/2004 there is a new compression fracture at L2.  This is age indeterminate.  IMPRESSION:  1.  Multilevel lumbar compression deformities.  When compared with 12/26/2004 there is been interval compression of the L2 vertebra. The other levels have been treated with vertebroplasty.  Per CMS PQRS reporting requirements (PQRS Measure 24): Given the patient's age of greater than 50 and the fracture site (hip, distal radius, or spine), the patient should be tested for osteoporosis using DXA, and the appropriate treatment considered based on the DXA results.  Original Report Authenticated By: Rosealee Albee, M.D.   Dg Wrist Complete Right  12/25/2011  *RADIOLOGY REPORT*  Clinical Data: Fall.  Right wrist pain.  RIGHT WRIST -  COMPLETE 3+ VIEW  Comparison: 12/25/2011  Findings: Distal radial deformity is observed.  Linear lucency extending to an irregular distal radial articular surface near the junction of the scaphoid and lunate could represent a nondisplaced fracture superimposed on the underlying deformity.  Prior distal ulnar resorption noted.  No additional fracture is identified.  IMPRESSION:  1.  Deformity of the distal radius.  There is some linear lucency in the distal radius which is indistinct and probably related to the old fracture.  Nondisplaced distal radial fracture extending to the distal articular surface cannot be totally excluded, and if the patient is point tender along the distal radius then CT or MRI would be recommended for further workup. 2.  Prior distal ulnar resorption. 3.  Bony demineralization.  Original Report Authenticated By: Dellia Cloud, M.D.   Dg Ankle Complete Left  12/25/2011  *RADIOLOGY REPORT*  Clinical Data: Fall.  Left ankle pain.  LEFT ANKLE COMPLETE - 3+ VIEW  Comparison: None.  Findings: Lag screw fixation of the medial malleolus with prior plate screw fixation of the lateral malleolus noted.  There is spurring between the medial malleolus and the talus.  Bony demineralization is present.  No acute fracture is observed.  Mild narrowing of the mortise noted.  Slightly irregular dorsal spurring at the talonavicular articulation noted.  IMPRESSION:  1.  Prior internal fixation of malleolar fractures.  No  acute fracture is observed. 2.  Spurring between the medial malleolus and the talus. 3.  Narrowing of the mortise. 4.  Dorsal talonavicular spurring. 5.  No acute fracture is observed.  Original Report Authenticated By: Dellia Cloud, M.D.   Ct Head Wo Contrast  12/26/2011  *RADIOLOGY REPORT*  Clinical Data: Eight slurred speech, altered mental status.  CT HEAD WITHOUT CONTRAST  Technique:  Contiguous axial images were obtained from the base of the skull through the vertex  without contrast.  Comparison: None  Findings: No acute intracranial abnormality.  Specifically, no hemorrhage, hydrocephalus, mass lesion, acute infarction, or significant intracranial injury.  No acute calvarial abnormality. Visualized paranasal sinuses and mastoids clear.  Orbital soft tissues unremarkable.  IMPRESSION: No acute intracranial abnormality.  Original Report Authenticated By: Cyndie Chime, M.D.   Dg Chest Portable 1 View  12/26/2011  *RADIOLOGY REPORT*  Clinical Data: Pain.  PORTABLE CHEST - 1 VIEW  Comparison: None.  Findings: Heart is borderline in size.  Lungs are clear.  No effusions.  No acute bony abnormality.  IMPRESSION: No active disease.  Original Report Authenticated By: Cyndie Chime, M.D.   Dg Knee Complete 4 Views Left  12/25/2011  *RADIOLOGY REPORT*  Clinical Data: Fall.  Left knee pain.  LEFT KNEE - COMPLETE 4+ VIEW  Comparison: Multiple exams, including 01/28/2005 and 01/23/2005  Findings: Bony demineralization noted along with mild medial compartmental articular space narrowing.  Mild prepatellar edema noted.  There is mild patellofemoral spurring.  No fracture is observed.  IMPRESSION:  1.  Bony demineralization. 2.  Osteoarthritis. 3.  Mild prepatellar edema.  Original Report Authenticated By: Dellia Cloud, M.D.   Dg Knee Complete 4 Views Right  12/25/2011  *RADIOLOGY REPORT*  Clinical Data: Fall.  Right knee pain.  RIGHT KNEE - COMPLETE 4+ VIEW  Comparison: Report from exam dated 08/15/2002  Findings: Bony demineralization is present. Degenerative loss of medial articular space noted.  No knee effusion is observed.  No fracture is identified.  IMPRESSION:  1.  Bony demineralization. 2.  Mild medial compartmental degenerative articular space loss.  Original Report Authenticated By: Dellia Cloud, M.D.   Dg Hand Complete Right  12/25/2011  *RADIOLOGY REPORT*  Clinical Data: Fall.  Right hand pain.  RIGHT HAND - COMPLETE 3+ VIEW  Comparison: None.   Findings: The bony demineralization noted.  There is evidence of osteoarthritis.  Deformity and absence of the distal ulna noted with deformity the distal radius, likely related to prior fracture and distal ulnar resorption.  No acute fracture is observed.  IMPRESSION:  1.  Deformity of the distal radius with resorption of the distal ulna, likely from prior injury. 2.  Bony demineralization. 3.  Osteomyelitis. 4.  No acute fracture is identified.  Original Report Authenticated By: Dellia Cloud, M.D.    Electrocardiogram: Poor quality ECG. Does not show any obvious abnormalities. Rate is about 70 beats per minute. Axis appears to be normal.  Assessment/Plan  Principal Problem:  *Seizure Active Problems:  UTI (lower urinary tract infection)  Anemia  Back pain, chronic   #1 seizure: This could be a result of the fall that the patient had yesterday. However, since she did have 2 long episodes of seizure activity patient will be given Keppra. MRI of the brain will be obtained tomorrow. EEG will be ordered. Neurology consultation may be considered by the patient's primary care physician.  #2. UTI: Urine cultures will be sent. Patient will be put on ciprofloxacin.  #  3 anemia: Anemia panel will be checked.  #4 chronic back pain: This will be monitored closely.  Patient's is a full code. DVT prophylaxis will be initiated.  Further management decisions will depend on results of further testing and patient's response to treatment.  Dr. Renard Matter will assume care in the morning.  [facility]  [facility] Pager [phone]  12/26/2011, 5:05 AM

## 2011-12-26 NOTE — ED Notes (Signed)
Family reported seizure activity. ems started iv & gave 2mg  ativan. Pt shaking arms in air upon arrival.

## 2011-12-26 NOTE — ED Notes (Signed)
Pt more alert. Responds to questions correctly. Not aware of events prior to coming to the ER. Nad noted. Pt comfortable. Pt given a sip of drink at this time. No other needs voiced at this time.

## 2011-12-26 NOTE — ED Notes (Signed)
Pt will open eyes & look at you when you call  Her name. Not answering questions, only says "I am sorry."

## 2011-12-27 ENCOUNTER — Other Ambulatory Visit: Payer: Self-pay

## 2011-12-27 LAB — CBC
MCH: 29.1 pg (ref 26.0–34.0)
MCHC: 32.7 g/dL (ref 30.0–36.0)
Platelets: 237 10*3/uL (ref 150–400)
RDW: 14.6 % (ref 11.5–15.5)

## 2011-12-27 LAB — COMPREHENSIVE METABOLIC PANEL
ALT: 8 U/L (ref 0–35)
AST: 18 U/L (ref 0–37)
Alkaline Phosphatase: 97 U/L (ref 39–117)
CO2: 27 mEq/L (ref 19–32)
Chloride: 107 mEq/L (ref 96–112)
GFR calc Af Amer: >90 mL/min (ref >90)
GFR calc non Af Amer: >90 mL/min (ref >90)
Glucose, Bld: 95 mg/dL (ref 70–99)
Sodium: 142 mEq/L (ref 135–145)
Total Bilirubin: 0.2 mg/dL — ABNORMAL LOW (ref 0.3–1.2)

## 2011-12-27 LAB — IRON AND TIBC
Iron: 42 ug/dL (ref 42–135)
TIBC: 365 ug/dL (ref 250–470)

## 2011-12-27 LAB — FERRITIN: Ferritin: 10 ng/mL (ref 10–291)

## 2011-12-27 LAB — FOLATE: Folate: 8.7 ng/mL

## 2011-12-27 LAB — CARDIAC PANEL(CRET KIN+CKTOT+MB+TROPI)
CK, MB: 4 ng/mL (ref 0.3–4.0)
Total CK: 207 U/L — ABNORMAL HIGH (ref 7–177)

## 2011-12-27 LAB — RETICULOCYTES: Retic Count, Absolute: 34.4 10*3/uL (ref 19.0–186.0)

## 2011-12-27 MED ORDER — NITROGLYCERIN 0.4 MG SL SUBL
0.4000 mg | SUBLINGUAL_TABLET | SUBLINGUAL | Status: DC | PRN
  Administered 2011-12-27: 0.4 mg via SUBLINGUAL

## 2011-12-27 MED ORDER — POTASSIUM CHLORIDE CRYS ER 20 MEQ PO TBCR
40.0000 meq | EXTENDED_RELEASE_TABLET | Freq: Once | ORAL | Status: AC
  Administered 2011-12-27: 40 meq via ORAL
  Filled 2011-12-27: qty 2

## 2011-12-27 MED ORDER — NITROGLYCERIN 0.4 MG SL SUBL
SUBLINGUAL_TABLET | SUBLINGUAL | Status: AC
  Filled 2011-12-27: qty 25

## 2011-12-27 MED ORDER — POTASSIUM CHLORIDE 10 MEQ/100ML IV SOLN
INTRAVENOUS | Status: AC
  Administered 2011-12-27: 10 meq
  Filled 2011-12-27: qty 100

## 2011-12-27 MED ORDER — DIPHENHYDRAMINE HCL 25 MG PO CAPS
50.0000 mg | ORAL_CAPSULE | ORAL | Status: DC | PRN
  Administered 2011-12-27 – 2011-12-28 (×4): 50 mg via ORAL
  Filled 2011-12-27 (×5): qty 2

## 2011-12-27 MED ORDER — POTASSIUM CHLORIDE 10 MEQ/100ML IV SOLN
10.0000 meq | INTRAVENOUS | Status: AC
  Administered 2011-12-27: 10 meq via INTRAVENOUS
  Filled 2011-12-27: qty 100

## 2011-12-27 NOTE — Progress Notes (Signed)
Explained the importance of the bed alarm and calling for assistance when getting out of the bed.  Patient refuses the bed alarm and stated that she would call if she needed to get out of the bed for any reason.  Will continue to monitor the patients activity closely.

## 2011-12-27 NOTE — Progress Notes (Signed)
Ellen Anderson, Ellen Anderson                 ACCOUNT NO.:  [id number]  MEDICAL RECORD NO.:  [id number]  LOCATION:  A334                          FACILITY:  [facility]  PHYSICIAN:  Deanna Wiater G. Renard Matter, MD   DATE OF BIRTH:  January 19, 1946  DATE OF PROCEDURE: DATE OF DISCHARGE:                                PROGRESS NOTE   The patient had a loss of consciousness prior to her admission. Apparently, had knee and head injury.  Subsequently, was discharged home and returned following an episode of unconsciousness with trembling and shaking in arms and legs.  She has not had a previous history of seizure activity, but was felt that she was postictal and had seizure activity as witnessed by ED physician.  She remains on IV Tegretol now, had no further seizure activity, but continues to complain of back pain.  She shows slight deformity of the distal radius with questionable nondisplaced fracture, but no fractures in the ankles, lumbar spine and compression deformity is noted.  The patient does have a low serum potassium.  She is scheduled for MRI of head and needs EEG and Neurology consult.  This is pending.  OBJECTIVE:  VITAL SIGNS:  Blood pressure 154/88, respirations 20, pulse 65 and temp 97.5. LUNGS:  Clear to P and A. HEART:  Regular rhythm. ABDOMEN:  No palpable organs or masses. NEUROLOGICAL:  No gross abnormalities, slight tenderness of right wrist.  ASSESSMENT:  The patient apparently had seizure prior to admission with loss of consciousness.  She does have chronic low back pain, secondary to multi-lumbar compression deformities and degenerative joint disease and compression deformity of L2.  PLAN:  To proceed with an MRI and Neurology consult.  The patient will need EEG.  He will continue IV Keppra, continue to use Dilaudid for pain control.     Khiree Bukhari G. Renard Matter, MD     AGM/MEDQ  D:  12/27/2011  T:  12/27/2011  Job:  [id number]

## 2011-12-27 NOTE — Progress Notes (Signed)
Writer called to room by pt and upon assessing pt, pt was holding chest and stating that her chest was hurting and difficulty breathing.   Rated pain 9 out of 10 and had pressure on chest.  O2 via [place] placed @ 4lpm per protocol.  Pt alert and stating that pain started at right elbow and then chest.  Pt talking and carrying conversation fine during assessment.  Stat EKG and cardiac enzymes called and ordered.  Nitro given at [employer] awaiting results. Dr. Fransico Him present in facility and writer made aware of pt's symptoms and received further orders and followed.  Pt made aware of new orders.

## 2011-12-27 NOTE — Progress Notes (Signed)
Writer spoke with Dr. Fransico Him regarding EKG and cardiac enzyme results along with pt update.  Received no further orders.

## 2011-12-27 NOTE — Progress Notes (Signed)
Writer reassessed pt's chest pain.  Pt rated pain 3 out of 10 now and that chest pain is much relieved. Pt talking about her sister and the room being renovated, in no distress.  EKG performed and will show Dr. Fransico Him.  Cardiac enzymes drawn awaiting results.  Will continue to monitor pt.

## 2011-12-28 ENCOUNTER — Observation Stay (HOSPITAL_COMMUNITY): Payer: Medicare Other

## 2011-12-28 ENCOUNTER — Inpatient Hospital Stay (HOSPITAL_COMMUNITY)
Admit: 2011-12-28 | Discharge: 2011-12-28 | Disposition: A | Discharge status: Home / Self Care | Payer: Medicare Other | Attending: Family Medicine | Admitting: Family Medicine

## 2011-12-28 ENCOUNTER — Other Ambulatory Visit (HOSPITAL_COMMUNITY): Payer: Medicare Other

## 2011-12-28 LAB — BASIC METABOLIC PANEL
Calcium: 9.5 mg/dL (ref 8.4–10.5)
GFR calc Af Amer: >90 mL/min (ref >90)
GFR calc non Af Amer: >90 mL/min (ref >90)
Glucose, Bld: 94 mg/dL (ref 70–99)
Potassium: 3.9 mEq/L (ref 3.5–5.1)
Sodium: 142 mEq/L (ref 135–145)

## 2011-12-28 MED ORDER — LEVETIRACETAM 500 MG/5ML IV SOLN
INTRAVENOUS | Status: AC
  Filled 2011-12-28: qty 5

## 2011-12-28 MED ORDER — PROMETHAZINE HCL 12.5 MG PO TABS
25.0000 mg | ORAL_TABLET | Freq: Four times a day (QID) | ORAL | Status: DC | PRN
  Administered 2011-12-28 – 2011-12-29 (×2): 25 mg via ORAL
  Filled 2011-12-28 (×2): qty 1
  Filled 2011-12-28: qty 2

## 2011-12-28 MED ORDER — DIPHENHYDRAMINE HCL 25 MG PO CAPS
50.0000 mg | ORAL_CAPSULE | Freq: Four times a day (QID) | ORAL | Status: DC
  Administered 2011-12-28 – 2011-12-29 (×5): 50 mg via ORAL
  Filled 2011-12-28 (×4): qty 2
  Filled 2011-12-28 (×2): qty 1

## 2011-12-28 MED ORDER — CIPROFLOXACIN IN D5W 400 MG/200ML IV SOLN
INTRAVENOUS | Status: AC
  Filled 2011-12-28: qty 200

## 2011-12-28 MED ORDER — SODIUM CHLORIDE 0.9 % IJ SOLN
INTRAMUSCULAR | Status: AC
  Filled 2011-12-28: qty 3

## 2011-12-28 MED ORDER — MEPERIDINE HCL 50 MG PO TABS
50.0000 mg | ORAL_TABLET | Freq: Four times a day (QID) | ORAL | Status: DC | PRN
  Administered 2011-12-28 – 2011-12-29 (×2): 50 mg via ORAL
  Filled 2011-12-28 (×2): qty 1

## 2011-12-28 NOTE — Consult Note (Signed)
Reason for Consult: Referring Physician:   [name] is an 66 y.o. female.  HPI  History reviewed. No pertinent past medical history.  Past Surgical History  Procedure Date  . Back surgery   . Ankle surgery   . Abdominal hysterectomy   . Cholecystectomy   . Appendectomy     Family History  Problem Relation Age of Onset  . Throat cancer Mother   . COPD Father     Social History:  reports that she has never smoked. She has never used smokeless tobacco. She reports that she does not drink alcohol or use illicit drugs.  Allergies:  Allergies  Allergen Reactions  . Augmentin Swelling    Of throat. "States she can take if given with prednisone and Benadryl"  . Ceclor (Cefaclor) Swelling    Of throat. States "I can take with prednisone and Benadryl"  . Morphine And Related Other (See Comments)    Causes heart to stop. Incidence occurred at [facility] about 6 years ago.  . Sulfur Shortness Of Breath    Tongue swelling  . Azithromycin Other (See Comments)    States "All '-mycins' break my mouth out in blisters". Can take penicillin  . Ivp Dye (Iodinated Diagnostic Agents) Other (See Comments)    convulsions  . Methadone Other (See Comments)    hallucinations  . Percocet (Oxycodone-Acetaminophen) Itching    Scratches til she bleeds  . Codeine Hives and Rash  . Ibuprofen Hives and Rash    Severe rash per patient  . Lexapro Other (See Comments)    hallucinations  . Tylenol (Acetaminophen) Hives and Rash    Severe itching    Medications:  Prior to Admission medications   Medication Sig Start Date End Date Taking? Authorizing Provider  aspirin 325 MG tablet Take 650 mg by mouth every 6 (six) hours as needed. For pain   Yes Historical Provider, MD    Scheduled Meds:   . ciprofloxacin  400 mg Intravenous Q12H  . diazepam  10 mg Oral BID  . diphenhydrAMINE  50 mg Oral QID  . levetiracetam  500 mg Intravenous Q12H  . nitroGLYCERIN      . sodium chloride  3 mL Intravenous  Q12H  . sodium chloride       Continuous Infusions:  PRN Meds:.acetaminophen, acetaminophen, albuterol, HYDROmorphone (DILAUDID) injection, nitroGLYCERIN, ondansetron (ZOFRAN) IV, ondansetron, DISCONTD: diphenhydrAMINE   Results for orders placed during the hospital encounter of 12/26/11 (from the past 48 hour(s))  VITAMIN B12     Status: Normal   Collection Time   12/27/11  5:03 AM      Component Value Range Comment   Vitamin B-12 227  211 - 911 (pg/mL)   FOLATE     Status: Normal   Collection Time   12/27/11  5:03 AM      Component Value Range Comment   Folate 8.7     IRON AND TIBC     Status: Abnormal   Collection Time   12/27/11  5:03 AM      Component Value Range Comment   Iron 42  42 - 135 (ug/dL)    TIBC 161  096 - 045 (ug/dL)    Saturation Ratios 12 (*) 20 - 55 (%)    UIBC 323  125 - 400 (ug/dL)   FERRITIN     Status: Normal   Collection Time   12/27/11  5:03 AM      Component Value Range Comment   Ferritin 10  10 - 291 (ng/mL)   RETICULOCYTES     Status: Abnormal   Collection Time   12/27/11  5:03 AM      Component Value Range Comment   Retic Ct Pct 0.9  0.4 - 3.1 (%)    RBC. 3.82 (*) 3.87 - 5.11 (MIL/uL)    Retic Count, Manual 34.4  19.0 - 186.0 (K/uL)   COMPREHENSIVE METABOLIC PANEL     Status: Abnormal   Collection Time   12/27/11  5:03 AM      Component Value Range Comment   Sodium 142  135 - 145 (mEq/L)    Potassium 3.1 (*) 3.5 - 5.1 (mEq/L)    Chloride 107  96 - 112 (mEq/L)    CO2 27  19 - 32 (mEq/L)    Glucose, Bld 95  70 - 99 (mg/dL)    BUN 4 (*) 6 - 23 (mg/dL)    Creatinine, Ser 1.19  0.50 - 1.10 (mg/dL)    Calcium 9.0  8.4 - 10.5 (mg/dL)    Total Protein 6.2  6.0 - 8.3 (g/dL)    Albumin 3.1 (*) 3.5 - 5.2 (g/dL)    AST 18  0 - 37 (U/L)    ALT 8  0 - 35 (U/L)    Alkaline Phosphatase 97  39 - 117 (U/L)    Total Bilirubin 0.2 (*) 0.3 - 1.2 (mg/dL)    GFR calc non Af Amer >90  >90 (mL/min)    GFR calc Af Amer >90  >90 (mL/min)   CBC     Status:  Abnormal   Collection Time   12/27/11  5:03 AM      Component Value Range Comment   WBC 5.8  4.0 - 10.5 (K/uL)    RBC 3.82 (*) 3.87 - 5.11 (MIL/uL)    Hemoglobin 11.1 (*) 12.0 - 15.0 (g/dL)    HCT 14.7 (*) 82.9 - 46.0 (%)    MCV 88.7  78.0 - 100.0 (fL)    MCH 29.1  26.0 - 34.0 (pg)    MCHC 32.7  30.0 - 36.0 (g/dL)    RDW 56.2  13.0 - 86.5 (%)    Platelets 237  150 - 400 (K/uL)   CARDIAC PANEL(CRET KIN+CKTOT+MB+TROPI)     Status: Abnormal   Collection Time   12/27/11  6:34 PM      Component Value Range Comment   Total CK 207 (*) 7 - 177 (U/L)    CK, MB 4.0  0.3 - 4.0 (ng/mL)    Troponin I <0.30  <0.30 (ng/mL)    Relative Index 1.9  0.0 - 2.5    BASIC METABOLIC PANEL     Status: Abnormal   Collection Time   12/28/11  4:03 AM      Component Value Range Comment   Sodium 142  135 - 145 (mEq/L)    Potassium 3.9  3.5 - 5.1 (mEq/L) DELTA CHECK NOTED   Chloride 105  96 - 112 (mEq/L)    CO2 31  19 - 32 (mEq/L)    Glucose, Bld 94  70 - 99 (mg/dL)    BUN 4 (*) 6 - 23 (mg/dL)    Creatinine, Ser 7.84  0.50 - 1.10 (mg/dL)    Calcium 9.5  8.4 - 10.5 (mg/dL)    GFR calc non Af Amer >90  >90 (mL/min)    GFR calc Af Amer >90  >90 (mL/min)     Mr Brain Wo Contrast  12/28/2011  *RADIOLOGY REPORT*  Clinical Data: 66 year old female with confusion, fall.  Seizure activity.  Head injury.  MRI HEAD WITHOUT CONTRAST  Technique:  Multiplanar, multiecho pulse sequences of the brain and surrounding structures were obtained according to standard protocol without intravenous contrast.  Comparison: Head CT without contrast 12/26/2011.  Findings: No restricted diffusion to suggest acute infarction.  No midline shift, mass effect, evidence of mass lesion, ventriculomegaly, extra-axial collection or acute intracranial hemorrhage.  Cervicomedullary junction and pituitary are within normal limits.  Major intracranial vascular flow voids are preserved.  Incidental asymmetric pneumatization of the left petrous apex and  anterior clinoid process.  Mild for age nonspecific cerebral white matter T2 and FLAIR hyperintensity.  Deep gray matter nuclei, brainstem and cerebellum within normal limits.  Negative visualized cervical spine. Visualized bone marrow signal is within normal limits. Grossly normal visualized internal auditory structures.  Visualized orbit soft tissues are within normal limits.  Visualized paranasal sinuses and mastoids are clear.  Negative visualized scalp soft tissues.  IMPRESSION: 1. No acute intracranial abnormality. 2.  Mild for age nonspecific white matter signal changes.  Original Report Authenticated By: Harley Hallmark, M.D.    Review of Systems  Constitutional: Negative.   HENT: Positive for neck pain.   Eyes: Negative.   Respiratory: Negative.   Cardiovascular: Negative.   Gastrointestinal: Positive for abdominal pain.  Genitourinary: Negative.   Musculoskeletal: Positive for myalgias, back pain, joint pain and falls.  Neurological: Positive for focal weakness, seizures, loss of consciousness and headaches.  Psychiatric/Behavioral: Positive for memory loss.   Blood pressure 141/91, pulse 75, temperature 97.9 F (36.6 C), temperature source Oral, resp. rate 16, height 5\' 4"  (1.626 m), weight 88.1 kg (194 lb 3.6 oz), SpO2 94.00%. Physical Exam  Assessment/Plan: See dicattion  Zehava Turski 12/28/2011, 7:20 PM

## 2011-12-28 NOTE — Progress Notes (Signed)
Ellen Anderson, Ellen Anderson                 ACCOUNT NO.:  [id number]  MEDICAL RECORD NO.:  [id number]  LOCATION:  A334                          FACILITY:  [facility]  PHYSICIAN:  Tylin Force G. Renard Matter, MD   DATE OF BIRTH:  Sep 05, 1946  DATE OF PROCEDURE: DATE OF DISCHARGE:                                PROGRESS NOTE   This patient had loss of consciousness prior to admission.  Apparently had knee and head injury, subsequently was discharged home and returned following an episode of unconsciousness with trembling and shaking in her arms and legs.  She has not had a previous history of seizure activity, which she was felt to be postictal and had seizure activity as witnessed by ED physician.  She remains on IV Tegretol now.  Has no seizure activity but continues to complain of pain in her right wrist and low back.  The wrist shows slight deformity but no obvious fracture. No fractures in ankles, lumbar spine, but compression deformities noted in the spine.  The patient had a low serum potassium level and is placed on potassium.  She is scheduled for MRI and EEG and neurology consult.  OBJECTIVE:  VITAL SIGNS:  Blood pressure 131/79, respirations 20, pulse 67, temp 99.8.  LUNGS:  Clear to P and A. HEART:  Regular rhythm. ABDOMEN:  No palpable organs or masses. EXTREMITIES:  Ecchymosis of right knee.  ASSESSMENT:  The patient did have seizure prior to admission with loss of consciousness.  The patient does have chronic low back pain secondary to multiple lumbar compression deformities and degenerative joint disease and compression deformities of L2.  PLAN:  To proceed with MRI today and neurology consult.  The patient will need EEG. Continue IV Keppra.  Continue Dilaudid.     Marnae Madani G. Renard Matter, MD     AGM/MEDQ  D:  12/28/2011  T:  12/28/2011  Job:  [id number]

## 2011-12-28 NOTE — Progress Notes (Signed)
UR Chart Review Completed  

## 2011-12-28 NOTE — Progress Notes (Signed)
  EEG was performed.

## 2011-12-28 NOTE — Progress Notes (Signed)
Pt has been stable throughout shift.  Has been determined is a high fall risk related to recent fall and suspected seizure activity with loc.  High fall risk protocol initiated.  However, Patient refuses to allow staff to activate alarm on bed and/or chair.  She states she does not need it.  It has been explained to her in length the need of use of alarm and is for her overall safety while she is a patient here.  She continues to refuse it despite staff request.  Pt also refuses to call for assistance before getting out of bed.  She states she is quite capable of getting up and down.  Again, the need for assistance was explained to patient and that is was for her overall safety because of her recent seizure activity and her recent administration of Dilaudid.  She denies bed alarm and refuses to use call light for assistance before transfers and ambulation.  Will continue to monitor patient throughout shift.  Door to her room is left open for closer monitoring.  More frequent "checks" have been initiated.  She remains in stable condition

## 2011-12-29 MED ORDER — CIPROFLOXACIN HCL 250 MG PO TABS: 500.0000 mg | ORAL_TABLET | Freq: Two times a day (BID) | ORAL | Status: DC

## 2011-12-29 NOTE — Procedures (Signed)
NAMEWALTER, MIN                 ACCOUNT NO.:  [id number]  MEDICAL RECORD NO.:  [id number]  LOCATION:  A334                          FACILITY:  [facility]  PHYSICIAN:  Draper Gallon A. Gerilyn Pilgrim, M.D. DATE OF BIRTH:  Jan 30, 1946  DATE OF PROCEDURE:  12/28/2011 DATE OF DISCHARGE:  12/29/2011                             EEG INTERPRETATION   REFERRING PHYSICIAN:  Angus G. 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  MEDICATIONS:  Cipro, Valium, Nitrostat and Benadryl.  She also was started on Keppra.  ANALYSIS:  A 16-channel recording using standard 10/20 measurements is conducted for 20 minutes.  There is a well-formed posterior dominant rhythm of 9.5 Hz which attenuates with eye opening.  There is beta activity observed in the frontal areas.  Awake and drowsy activities are recorded.  Photic stimulation is carried out without abnormal changes in the background activity.  There is no focal or lateralized slowing. There is no epileptiform activity.  IMPRESSION:  Normal recording of the awake and drowsy states.     Tilia Faso A. Gerilyn Pilgrim, M.D.     KAD/MEDQ  D:  12/29/2011  T:  12/29/2011  Job:  [id number]

## 2011-12-29 NOTE — Discharge Summary (Signed)
Ellen Anderson, Ellen Anderson                 ACCOUNT NO.:  [id number]  MEDICAL RECORD NO.:  [id number]  LOCATION:  A334                          FACILITY:  [facility]  PHYSICIAN:  Azir Muzyka G. Renard Matter, MD   DATE OF BIRTH:  1946/04/01  DATE OF ADMISSION:  12/26/2011 DATE OF DISCHARGE:  LH                              DISCHARGE SUMMARY   This is a 66 year old female who was admitted on December 25, 2010, and discharged on December 28, 2010.  DIAGNOSES:  Seizure disorder, urinary tract infection, anemia, chronic back pain secondary to multiple lumbar compression deformities and degenerative joint disease, compression deformity of L2.  CONDITION:  Stable at the time of her discharge.  This patient lost consciousness prior to admission, had knee and head injury.  She apparently was discharged from the emergency room, went home and following this had another episode of unconsciousness with trembling and shaking of arms and legs.  She had not had previous history of seizure activity.  She was felt to be postictal and admitted and a seizure was witnessed by ED physician.  The patient was subsequently admitted.  PHYSICAL EXAMINATION ON ADMISSION:  GENERAL:  Alert. VITAL SIGNS:  The patient with blood pressure 120/70, pulse 76, temp 97.9. HEENT:  Eyes, PERRLA.  TMs negative.  Oropharynx benign. NECK:  Supple.  No JVD or thyroid abnormalities. HEART:  Regular rhythm.  No murmurs. LUNGS:  Clear to P and A. ABDOMEN:  No palpable organs or masses. EXTREMITIES:  Free of edema. SKIN:  Warm and dry. NEUROLOGIC:  Grossly normal.  No focal deficits. EXTREMITIES:  The patient did have some slight tenderness of the right wrist.  LABORATORY DATA:  Admission CBC:  WBC 4900 with a hemoglobin of 11.1, hematocrit 33.2.  Subsequent CBC:  WBC 5800 with hemoglobin 11.1, hematocrit 33.9.  Chemistries on admission:  Sodium 138, potassium 3.5, chloride 105, CO2 of 29, BUN 7, creatinine 0.61, calcium 8.2.   Alkaline phosphatase 92, albumin 3, AST 15, and ALT 7.  Subsequent chemistries on December 26, 2010, showed a potassium of 3.1, following repletion potassium 3.9.  Urinalysis positive nitrite.  X-rays of lumbar spine, multilevel lumbar compression deformities, interval compression of L2 vertebra, other levels treated by vertebroplasty.  Left ankle, prior internal fixation of bimalleolar fractures, spurring of medial malleolus, no acute fracture.  Right hand, deformity of distal radius, no acute fracture identified.  Left knee, bony demineralization, osteoarthritis, no fracture.  MRI of the head without contrast, no acute intracranial abnormality, nonspecific white matter changes.  Chest x- ray, heart borderline sized, lungs clear.  CT of the head, no acute intracranial abnormality.Marland Kitchen  HOSPITAL COURSE:  The patient at the time of her admission was placed on IV normal saline.  She was continued on Cipro 400 mg every 12 hours, Valium 10 mg b.i.d., Benadryl 50 mg q.i.d., IV Keppra 500 mg every 12 hours, p.r.n. Zofran 4 mg, p.r.n. Dilaudid 1 mg and this was changed to Demerol p.o. 50 mg q.6 hours.  The patient during her hospital stay improved significantly.  She did not have any further seizures.  She did complain of back pain.  She was seen in consultation by  Neurology, Dr. Gerilyn Pilgrim who did order EEG.  This was completed, but reports not available.  He did recommend a followup visit with him at his office. This patient was discharged on the 3rd hospital day on the following medications:  Cipro 500 mg b.i.d., Keppra 500 mg b.i.d.  the patient was stable at the time of her discharge.  The patient did have following labs done yesterday:  Folate 8.7, ferritin 10 ng/mL, vitamin B12 227. The patient did have slightly low potassium and this was repleted. Culture report from urine, 75,000 colonies, gram-negative rods.     [name] G. Renard Matter, MD     AGM/MEDQ  D:  12/29/2011  T:  12/29/2011   Job:  [id number]

## 2011-12-29 NOTE — Consult Note (Signed)
Ellen Anderson, Ellen Anderson                 ACCOUNT NO.:  [id number]  MEDICAL RECORD NO.:  [id number]  LOCATION:                                 FACILITY:  PHYSICIAN:  Jameelah Watts A. [name], M.D. DATE OF BIRTH:  01/13/1946  DATE OF CONSULTATION: DATE OF DISCHARGE:                                CONSULTATION   This is a 66 year old lady who presents with what appears to be a seizure.  The patient had presented previously with a fall.  There is some suggestion that she may have passed out momentarily, but the patient does not recall losing consciousness.  Her brother tells, however, that she did hit her head, although the patient had no recollection of hitting her head and she complained of headache afterwards.  The patient was evaluated in the emergency room to have x- rays of extremities and back and was released.  She subsequently may had seizure, which seems to be of generalized tonic-clonic seizure with shaking of the upper and lower extremities, loss of consciousness, foaming of the mouth.  The patient was postictal and confused and did lose consciousness during the second event.  There is no previous history of seizures.  No history of head injury, stroke or CNS infections.  The patient reports having a lot of chronic back pain.  She has a long list of drug allergies and reports the only thing that really works without causing itching is Demerol.  She was taken off this, however, and has taken high dose of aspirin 8-16 tablets a day, although she has had some issues with GI symptoms, which Dr. Bradly Bienenstock has told to cut back.  The patient did not report having any oral trauma, urine incontinence with her event.  LABORATORY DATA:  Reviewed and essentially unremarkable for any metabolic explanation.  DIAGNOSTIC DATA:  Imaging has been negative.  PHYSICAL EXAMINATION:  GENERAL:  She is awake and alert. HEENT:  Head is normocephalic, atraumatic. NECK:  Supple. ABDOMEN:  Soft. EXTREMITIES:   Significant bruising of the knees and legs bilaterally. She does report having a lot of itching from Demerol indeed.  She does seem to have a small red papular rash involving the buttocks and back region. NEURO: She is awake and alert.  She is lucid and coherent.  Speech, language, and cognition are intact.  Cranial nerve evaluation, pupils are equal, round, and reactive to light.  Visual fields are intact. Extraocular movements are full.  Facial muscle strength is symmetric. Tongue is midline.  Uvula is midline.  Shoulder shrug is normal.  Motor examination shows mild weakness of the right shoulder.  She has a lot of pain there from a recent fall.  The strength is graded at 4+/5.  The other extremity shows normal tone, bulk, and strength.  Reflexes are 2+. Plantars are equivocal.  Sensation is normal to light touch and temperature.  Coordination shows no dysmetria.  There is no tremors. No parkinsonism.  ASSESSMENT AND PLAN:  New-onset seizure.  The patient seemed to have had one clear spell, it is unclear that the initial spell she had was a seizure.  I did not get a sound that it was a  seizure.  She apparently seemed to have a fall and hit her head.  At this time, she does not meet the criteria for epilepsy, but she has been started on Keppra in any way.  EEG is still pending, we will check the result of that.  Her CT scan has been negative.  Again, no metabolic derangement to explain the seizure and therefore this is a single unprovoked event.  Chronic pain now with acute musculoskeletal pain, status post fall.  She complained about itching from the hydromorphone and wants this to be discontinued. I think it is reasonable to discontinue the hydromorphone and start on meperidine.  We will start on 50 mg every 6 hours as needed.  She wants to take Phenergan with it to help with abdominal discomfort  that she gets with meperidine, which seems reasonable.  We will follow the  EEG results and it is okay for her to be discharged and followup with [place] in the office in 2-4 weeks.     Ellen Anderson, M.D.     KAD/MEDQ  D:  12/28/2011  T:  12/28/2011  Job:  [id number]

## 2011-12-29 NOTE — Progress Notes (Signed)
Patient very anxious to leave, patient packed up and left floor before I could give her discharge instructions. Patient was asked to wait for her discharge instructions but refused. Patient was made aware of her follow up appts, while she was walking out the door. The appts were given to her written on a piece of paper. Staff accompanied patient downstairs to meet her ride. No c/o pain at d/c Latrelle Fuston, [employer]

## 2012-03-11 ENCOUNTER — Encounter (HOSPITAL_COMMUNITY): Payer: Self-pay | Admitting: Emergency Medicine

## 2012-03-11 ENCOUNTER — Inpatient Hospital Stay (HOSPITAL_COMMUNITY)
Admission: EM | Admit: 2012-03-11 | Discharge: 2012-03-16 | DRG: 481 | Disposition: A | Discharge status: Skilled Nursing Facility | Payer: Medicare Other | Attending: Orthopedic Surgery | Admitting: Orthopedic Surgery

## 2012-03-11 DIAGNOSIS — Z888 Allergy status to other drugs, medicaments and biological substances status: Secondary | ICD-10-CM

## 2012-03-11 DIAGNOSIS — J9819 Other pulmonary collapse: Secondary | ICD-10-CM | POA: Diagnosis not present

## 2012-03-11 DIAGNOSIS — Y92009 Unspecified place in unspecified non-institutional (private) residence as the place of occurrence of the external cause: Secondary | ICD-10-CM

## 2012-03-11 DIAGNOSIS — I10 Essential (primary) hypertension: Secondary | ICD-10-CM | POA: Diagnosis present

## 2012-03-11 DIAGNOSIS — S72002A Fracture of unspecified part of neck of left femur, initial encounter for closed fracture: Secondary | ICD-10-CM | POA: Diagnosis present

## 2012-03-11 DIAGNOSIS — M1990 Unspecified osteoarthritis, unspecified site: Secondary | ICD-10-CM | POA: Diagnosis present

## 2012-03-11 DIAGNOSIS — Z885 Allergy status to narcotic agent status: Secondary | ICD-10-CM

## 2012-03-11 DIAGNOSIS — G8929 Other chronic pain: Secondary | ICD-10-CM | POA: Diagnosis present

## 2012-03-11 DIAGNOSIS — M545 Low back pain, unspecified: Secondary | ICD-10-CM | POA: Diagnosis present

## 2012-03-11 DIAGNOSIS — D649 Anemia, unspecified: Secondary | ICD-10-CM | POA: Diagnosis present

## 2012-03-11 DIAGNOSIS — Y998 Other external cause status: Secondary | ICD-10-CM

## 2012-03-11 DIAGNOSIS — Z7982 Long term (current) use of aspirin: Secondary | ICD-10-CM

## 2012-03-11 DIAGNOSIS — W010XXA Fall on same level from slipping, tripping and stumbling without subsequent striking against object, initial encounter: Secondary | ICD-10-CM | POA: Diagnosis present

## 2012-03-11 DIAGNOSIS — S72143A Displaced intertrochanteric fracture of unspecified femur, initial encounter for closed fracture: Principal | ICD-10-CM | POA: Diagnosis present

## 2012-03-11 HISTORY — DX: Essential (primary) hypertension: I10

## 2012-03-11 HISTORY — DX: Unspecified osteoarthritis, unspecified site: M19.90

## 2012-03-11 HISTORY — DX: Low back pain, unspecified: M54.50

## 2012-03-11 HISTORY — DX: Other chronic pain: G89.29

## 2012-03-11 HISTORY — DX: Low back pain: M54.5

## 2012-03-11 HISTORY — DX: Anemia, unspecified: D64.9

## 2012-03-11 NOTE — ED Notes (Signed)
Patient stated she tripped on her sock and fell at home. Complaining of left hip pain. Denies LOC.

## 2012-03-12 ENCOUNTER — Encounter (HOSPITAL_COMMUNITY): Payer: Self-pay | Admitting: Anesthesiology

## 2012-03-12 ENCOUNTER — Encounter (HOSPITAL_COMMUNITY): Payer: Self-pay | Admitting: Emergency Medicine

## 2012-03-12 ENCOUNTER — Inpatient Hospital Stay (HOSPITAL_COMMUNITY): Payer: Medicare Other

## 2012-03-12 ENCOUNTER — Other Ambulatory Visit: Payer: Self-pay

## 2012-03-12 ENCOUNTER — Encounter (HOSPITAL_COMMUNITY)
Admission: EM | Disposition: A | Discharge status: Skilled Nursing Facility | Payer: Self-pay | Source: Home / Self Care | Attending: Orthopedic Surgery

## 2012-03-12 ENCOUNTER — Inpatient Hospital Stay (HOSPITAL_COMMUNITY): Payer: Medicare Other | Admitting: Anesthesiology

## 2012-03-12 ENCOUNTER — Emergency Department (HOSPITAL_COMMUNITY): Payer: Medicare Other

## 2012-03-12 DIAGNOSIS — S72002A Fracture of unspecified part of neck of left femur, initial encounter for closed fracture: Secondary | ICD-10-CM

## 2012-03-12 DIAGNOSIS — S72009A Fracture of unspecified part of neck of unspecified femur, initial encounter for closed fracture: Secondary | ICD-10-CM

## 2012-03-12 HISTORY — DX: Fracture of unspecified part of neck of left femur, initial encounter for closed fracture: S72.002A

## 2012-03-12 LAB — CBC
Hemoglobin: 11.2 g/dL — ABNORMAL LOW (ref 12.0–15.0)
MCH: 29.6 pg (ref 26.0–34.0)
MCHC: 32.7 g/dL (ref 30.0–36.0)
Platelets: 230 10*3/uL (ref 150–400)
RBC: 3.79 MIL/uL — ABNORMAL LOW (ref 3.87–5.11)

## 2012-03-12 LAB — BASIC METABOLIC PANEL
Calcium: 9 mg/dL (ref 8.4–10.5)
GFR calc Af Amer: >90 mL/min (ref >90)
GFR calc non Af Amer: >90 mL/min (ref >90)
Glucose, Bld: 105 mg/dL — ABNORMAL HIGH (ref 70–99)
Potassium: 3.6 mEq/L (ref 3.5–5.1)
Sodium: 138 mEq/L (ref 135–145)

## 2012-03-12 LAB — URINALYSIS, ROUTINE W REFLEX MICROSCOPIC
Leukocytes, UA: NEGATIVE
Protein, ur: NEGATIVE mg/dL
Specific Gravity, Urine: 1.025 (ref 1.005–1.030)
Urobilinogen, UA: 0.2 mg/dL (ref 0.0–1.0)

## 2012-03-12 LAB — SURGICAL PCR SCREEN: Staphylococcus aureus: NEGATIVE

## 2012-03-12 LAB — DIFFERENTIAL
Basophils Relative: 0 % (ref 0–1)
Eosinophils Absolute: 0.3 10*3/uL (ref 0.0–0.7)
Lymphs Abs: 1.4 10*3/uL (ref 0.7–4.0)
Neutro Abs: 6.2 10*3/uL (ref 1.7–7.7)
Neutrophils Relative %: 72 % (ref 43–77)

## 2012-03-12 LAB — PREPARE RBC (CROSSMATCH)

## 2012-03-12 LAB — ABO/RH: ABO/RH(D): O POS

## 2012-03-12 SURGERY — FIXATION, FRACTURE, INTERTROCHANTERIC, WITH INTRAMEDULLARY ROD
Anesthesia: Spinal | Site: Hip | Laterality: Left | Wound class: Clean

## 2012-03-12 MED ORDER — HYDROMORPHONE HCL PF 1 MG/ML IJ SOLN
0.5000 mg | INTRAMUSCULAR | Status: DC | PRN
  Administered 2012-03-12: 0.5 mg via INTRAVENOUS
  Administered 2012-03-13 (×3): 1 mg via INTRAVENOUS
  Administered 2012-03-14: 0.5 mg via INTRAVENOUS
  Administered 2012-03-15: 1 mg via INTRAVENOUS
  Administered 2012-03-15: 0.5 mg via INTRAVENOUS
  Filled 2012-03-12 (×7): qty 1

## 2012-03-12 MED ORDER — ONDANSETRON HCL 4 MG/2ML IJ SOLN
4.0000 mg | Freq: Three times a day (TID) | INTRAMUSCULAR | Status: DC | PRN
  Administered 2012-03-12: 4 mg via INTRAVENOUS
  Filled 2012-03-12: qty 2

## 2012-03-12 MED ORDER — ONDANSETRON HCL 4 MG PO TABS
4.0000 mg | ORAL_TABLET | Freq: Four times a day (QID) | ORAL | Status: DC | PRN
  Administered 2012-03-13: 4 mg via ORAL
  Filled 2012-03-12: qty 1

## 2012-03-12 MED ORDER — BUPIVACAINE-EPINEPHRINE PF 0.5-1:200000 % IJ SOLN
INTRAMUSCULAR | Status: DC | PRN
  Administered 2012-03-12: 60 mL

## 2012-03-12 MED ORDER — FENTANYL CITRATE 0.05 MG/ML IJ SOLN
50.0000 ug | Freq: Once | INTRAMUSCULAR | Status: AC
  Administered 2012-03-12: 50 ug via INTRAVENOUS
  Filled 2012-03-12: qty 2

## 2012-03-12 MED ORDER — METOCLOPRAMIDE HCL 5 MG/ML IJ SOLN: 5.0000 mg | Freq: Three times a day (TID) | INTRAMUSCULAR | Status: DC | PRN

## 2012-03-12 MED ORDER — VANCOMYCIN HCL IN DEXTROSE 1-5 GM/200ML-% IV SOLN
1000.0000 mg | Freq: Three times a day (TID) | INTRAVENOUS | Status: AC
  Administered 2012-03-13 (×2): 1000 mg via INTRAVENOUS
  Filled 2012-03-12 (×2): qty 200

## 2012-03-12 MED ORDER — ONDANSETRON HCL 4 MG/2ML IJ SOLN: 4.0000 mg | Freq: Four times a day (QID) | INTRAMUSCULAR | Status: DC | PRN

## 2012-03-12 MED ORDER — METHOCARBAMOL 100 MG/ML IJ SOLN
500.0000 mg | Freq: Four times a day (QID) | INTRAMUSCULAR | Status: DC | PRN
  Filled 2012-03-12: qty 5

## 2012-03-12 MED ORDER — DIAZEPAM 5 MG PO TABS
10.0000 mg | ORAL_TABLET | Freq: Three times a day (TID) | ORAL | Status: DC | PRN
  Administered 2012-03-12 – 2012-03-13 (×2): 10 mg via ORAL
  Filled 2012-03-12 (×4): qty 2

## 2012-03-12 MED ORDER — SENNOSIDES-DOCUSATE SODIUM 8.6-50 MG PO TABS: 1.0000 | ORAL_TABLET | Freq: Every evening | ORAL | Status: DC | PRN

## 2012-03-12 MED ORDER — PHENOL 1.4 % MT LIQD: 1.0000 | OROMUCOSAL | Status: DC | PRN

## 2012-03-12 MED ORDER — MIDAZOLAM HCL 2 MG/2ML IJ SOLN
INTRAMUSCULAR | Status: AC
  Filled 2012-03-12: qty 2

## 2012-03-12 MED ORDER — 0.9 % SODIUM CHLORIDE (POUR BTL) OPTIME
TOPICAL | Status: DC | PRN
  Administered 2012-03-12: 1000 mL

## 2012-03-12 MED ORDER — VANCOMYCIN HCL IN DEXTROSE 1-5 GM/200ML-% IV SOLN
1000.0000 mg | INTRAVENOUS | Status: AC
  Administered 2012-03-12: 1000 mg via INTRAVENOUS
  Filled 2012-03-12: qty 200

## 2012-03-12 MED ORDER — BUPIVACAINE IN DEXTROSE 0.75-8.25 % IT SOLN
INTRATHECAL | Status: AC
  Filled 2012-03-12: qty 2

## 2012-03-12 MED ORDER — LIDOCAINE HCL (PF) 1 % IJ SOLN
INTRAMUSCULAR | Status: AC
  Filled 2012-03-12: qty 5

## 2012-03-12 MED ORDER — METOCLOPRAMIDE HCL 10 MG PO TABS: 5.0000 mg | ORAL_TABLET | Freq: Three times a day (TID) | ORAL | Status: DC | PRN

## 2012-03-12 MED ORDER — MENTHOL 3 MG MT LOZG: 1.0000 | LOZENGE | OROMUCOSAL | Status: DC | PRN

## 2012-03-12 MED ORDER — FENTANYL CITRATE 0.05 MG/ML IJ SOLN
INTRAMUSCULAR | Status: AC
  Filled 2012-03-12: qty 2

## 2012-03-12 MED ORDER — PROPOFOL 10 MG/ML IV EMUL
INTRAVENOUS | Status: AC
  Filled 2012-03-12: qty 20

## 2012-03-12 MED ORDER — BUPIVACAINE HCL 0.75 % IJ SOLN
INTRAMUSCULAR | Status: DC | PRN
  Administered 2012-03-12: 2 mL via INTRATHECAL

## 2012-03-12 MED ORDER — FENTANYL CITRATE 0.05 MG/ML IJ SOLN
50.0000 ug | Freq: Once | INTRAMUSCULAR | Status: AC
  Administered 2012-03-12: 50 ug via INTRAVENOUS
  Filled 2012-03-12 (×2): qty 2

## 2012-03-12 MED ORDER — MAGNESIUM CITRATE PO SOLN: 1.0000 | Freq: Once | ORAL | Status: AC | PRN

## 2012-03-12 MED ORDER — BUPIVACAINE-EPINEPHRINE PF 0.5-1:200000 % IJ SOLN
INTRAMUSCULAR | Status: AC
  Filled 2012-03-12: qty 20

## 2012-03-12 MED ORDER — BIOTENE DRY MOUTH MT LIQD: 15.0000 mL | Freq: Three times a day (TID) | OROMUCOSAL | Status: DC

## 2012-03-12 MED ORDER — CHLORHEXIDINE GLUCONATE 4 % EX LIQD
60.0000 mL | Freq: Once | CUTANEOUS | Status: DC
  Filled 2012-03-12 (×2): qty 60

## 2012-03-12 MED ORDER — EPHEDRINE SULFATE 50 MG/ML IJ SOLN
INTRAMUSCULAR | Status: DC | PRN
  Administered 2012-03-12: 10 mg via INTRAVENOUS
  Administered 2012-03-12 (×2): 20 mg via INTRAVENOUS

## 2012-03-12 MED ORDER — SODIUM CHLORIDE 0.9 % IV SOLN
INTRAVENOUS | Status: DC
  Administered 2012-03-12: 800 mL via INTRAVENOUS

## 2012-03-12 MED ORDER — METHOCARBAMOL 500 MG PO TABS
500.0000 mg | ORAL_TABLET | Freq: Four times a day (QID) | ORAL | Status: DC | PRN
  Administered 2012-03-15: 500 mg via ORAL
  Filled 2012-03-12: qty 1

## 2012-03-12 MED ORDER — BISACODYL 10 MG RE SUPP: 10.0000 mg | Freq: Every day | RECTAL | Status: DC | PRN

## 2012-03-12 MED ORDER — FENTANYL CITRATE 0.05 MG/ML IJ SOLN
INTRAMUSCULAR | Status: DC | PRN
  Administered 2012-03-12: 20 ug via INTRATHECAL

## 2012-03-12 MED ORDER — SODIUM CHLORIDE 0.9 % IV SOLN
INTRAVENOUS | Status: AC
  Administered 2012-03-12 – 2012-03-13 (×2): via INTRAVENOUS
  Administered 2012-03-13: 1000 mL via INTRAVENOUS

## 2012-03-12 MED ORDER — DOCUSATE SODIUM 100 MG PO CAPS
100.0000 mg | ORAL_CAPSULE | Freq: Two times a day (BID) | ORAL | Status: DC
  Administered 2012-03-12 – 2012-03-15 (×5): 100 mg via ORAL
  Filled 2012-03-12 (×8): qty 1

## 2012-03-12 MED ORDER — EPHEDRINE SULFATE 50 MG/ML IJ SOLN
INTRAMUSCULAR | Status: AC
  Filled 2012-03-12: qty 1

## 2012-03-12 MED ORDER — MIDAZOLAM HCL 5 MG/5ML IJ SOLN
INTRAMUSCULAR | Status: DC | PRN
  Administered 2012-03-12 (×2): 2 mg via INTRAVENOUS

## 2012-03-12 MED ORDER — ALUM & MAG HYDROXIDE-SIMETH 200-200-20 MG/5ML PO SUSP: 30.0000 mL | ORAL | Status: DC | PRN

## 2012-03-12 MED ORDER — TRAMADOL HCL 50 MG PO TABS
50.0000 mg | ORAL_TABLET | Freq: Four times a day (QID) | ORAL | Status: DC
  Administered 2012-03-12 – 2012-03-16 (×14): 50 mg via ORAL
  Filled 2012-03-12 (×16): qty 1

## 2012-03-12 MED ORDER — FENTANYL CITRATE 0.05 MG/ML IJ SOLN
50.0000 ug | INTRAMUSCULAR | Status: DC | PRN
  Administered 2012-03-12 – 2012-03-16 (×18): 50 ug via INTRAVENOUS
  Filled 2012-03-12 (×19): qty 2

## 2012-03-12 MED ORDER — PROPOFOL 10 MG/ML IV EMUL
INTRAVENOUS | Status: DC | PRN
  Administered 2012-03-12: 100 ug/kg/min via INTRAVENOUS

## 2012-03-12 MED ORDER — FENTANYL CITRATE 0.05 MG/ML IJ SOLN
INTRAMUSCULAR | Status: DC | PRN
  Administered 2012-03-12 (×2): 50 ug via INTRAVENOUS

## 2012-03-12 MED ORDER — LACTATED RINGERS IV SOLN
INTRAVENOUS | Status: DC | PRN
  Administered 2012-03-12: 15:00:00 via INTRAVENOUS

## 2012-03-12 MED ORDER — METHOCARBAMOL 100 MG/ML IJ SOLN
INTRAMUSCULAR | Status: AC
  Filled 2012-03-12: qty 10

## 2012-03-12 MED ORDER — METHOCARBAMOL 100 MG/ML IJ SOLN
500.0000 mg | Freq: Once | INTRAVENOUS | Status: AC
  Administered 2012-03-12: 500 mg via INTRAVENOUS
  Filled 2012-03-12: qty 5

## 2012-03-12 MED ORDER — SENNA 8.6 MG PO TABS
1.0000 | ORAL_TABLET | Freq: Two times a day (BID) | ORAL | Status: DC
  Administered 2012-03-12 – 2012-03-15 (×5): 8.6 mg via ORAL
  Filled 2012-03-12 (×8): qty 1

## 2012-03-12 SURGICAL SUPPLY — 45 items
BAG HAMPER (MISCELLANEOUS) ×2 IMPLANT
BIT DRILL LONG 4.0 (BIT) ×1 IMPLANT
BLADE SURG SZ10 CARB STEEL (BLADE) ×2 IMPLANT
CHLORAPREP W/TINT 26ML (MISCELLANEOUS) ×2 IMPLANT
CLOTH BEACON ORANGE TIMEOUT ST (SAFETY) ×2 IMPLANT
COVER SURGICAL LIGHT HANDLE (MISCELLANEOUS) ×4 IMPLANT
DRAPE STERI IOBAN 125X83 (DRAPES) ×2 IMPLANT
DRILL BIT LONG 4.0 (BIT) ×2
DRSG MEPILEX BORDER 4X12 (GAUZE/BANDAGES/DRESSINGS) ×2 IMPLANT
GAUZE SPONGE 4X4 16PLY XRAY LF (GAUZE/BANDAGES/DRESSINGS) ×2 IMPLANT
GLOVE BIOGEL PI IND STRL 7.5 (GLOVE) ×1 IMPLANT
GLOVE BIOGEL PI INDICATOR 7.5 (GLOVE) ×1
GLOVE ECLIPSE 7.0 STRL STRAW (GLOVE) ×2 IMPLANT
GLOVE SKINSENSE NS SZ8.0 LF (GLOVE) ×1
GLOVE SKINSENSE STRL SZ8.0 LF (GLOVE) ×1 IMPLANT
GLOVE SS N UNI LF 8.5 STRL (GLOVE) ×2 IMPLANT
GOWN STRL REIN XL XLG (GOWN DISPOSABLE) ×6 IMPLANT
GUIDEROD T2 3X1000 (ROD) ×2 IMPLANT
INST SET MAJOR BONE (KITS) ×2 IMPLANT
K-WIRE  3.2X450M STR (WIRE) ×1
K-WIRE 3.2X450M STR (WIRE) ×1
KIT ROOM TURNOVER APOR (KITS) ×2 IMPLANT
KWIRE 3.2X450M STR (WIRE) ×1 IMPLANT
MANIFOLD NEPTUNE II (INSTRUMENTS) ×2 IMPLANT
NAIL TROCH GAMMA 11X18 (Nail) ×2 IMPLANT
NEEDLE HYPO 21X1.5 SAFETY (NEEDLE) ×2 IMPLANT
NS IRRIG 1000ML POUR BTL (IV SOLUTION) ×4 IMPLANT
PACK BASIC III (CUSTOM PROCEDURE TRAY) ×2
PACK SRG BSC III STRL LF ECLPS (CUSTOM PROCEDURE TRAY) ×1 IMPLANT
PAD ARMBOARD 7.5X6 YLW CONV (MISCELLANEOUS) ×2 IMPLANT
SCREW LAG GAMMA 3 TI 10.5X105M (Screw) ×2 IMPLANT
SCREW LOCKING T2 F/T  5MMX45MM (Screw) ×1 IMPLANT
SCREW LOCKING T2 F/T 5MMX45MM (Screw) ×1 IMPLANT
SET BASIN LINEN APH (SET/KITS/TRAYS/PACK) ×2 IMPLANT
SPONGE GAUZE 4X4 12PLY (GAUZE/BANDAGES/DRESSINGS) ×2 IMPLANT
SPONGE LAP 18X18 X RAY DECT (DISPOSABLE) ×2 IMPLANT
STAPLER VISISTAT 35W (STAPLE) ×2 IMPLANT
SUT MON AB 0 CT1 (SUTURE) ×2 IMPLANT
SUT MON AB 2-0 SH 27 (SUTURE) ×4
SUT MON AB 2-0 SH27 (SUTURE) ×2 IMPLANT
SUT VIC AB 1 CT1 27 (SUTURE) ×4
SUT VIC AB 1 CT1 27XBRD ANTBC (SUTURE) ×2 IMPLANT
SYR 3ML LL SCALE MARK (SYRINGE) ×2 IMPLANT
SYR BULB IRRIGATION 50ML (SYRINGE) ×2 IMPLANT
TAPE MEDIFIX FOAM 3 (GAUZE/BANDAGES/DRESSINGS) ×2 IMPLANT

## 2012-03-12 NOTE — H&P (Signed)
Ellen Anderson is an 66 y.o. female.   Chief Complaint: pain left hip  HPI: She got up from a chair and fell simply tripping over her foot. She cannot ambulate. She was brought to the emergency room. Workup there revealed a left hip fracture. She was admitted and prepared for surgery. She complains of severe left hip pain which is nonradiating located over the left trochanter and left thigh associated with mild swelling and inability to weight-bear.  She has significant history of multiple drug allergies.  Past Medical History  Diagnosis Date  . Degenerative joint disease   . Chronic low back pain   . Hypertension   . Seizures     Had "convulsions", not sure if seizure after hitting head when falling in snow  . Anemia     Past Surgical History  Procedure Date  . Back surgery   . Ankle surgery   . Abdominal hysterectomy   . Cholecystectomy   . Appendectomy     Family History  Problem Relation Age of Onset  . Throat cancer Mother   . COPD Father    Social History:  reports that she has never smoked. She has never used smokeless tobacco. She reports that she does not drink alcohol or use illicit drugs.  Allergies:  Allergies  Allergen Reactions  . Augmentin Swelling    Of throat. "States she can take if given with prednisone and Benadryl"  . Ceclor (Cefaclor) Swelling    Of throat. States "I can take with prednisone and Benadryl"  . Morphine And Related Other (See Comments)    Causes heart to stop. Incidence occurred at [facility] about 6 years ago.  . Sulfur Shortness Of Breath    Tongue swelling  . Azithromycin Other (See Comments)    States "All '-mycins' break my mouth out in blisters". Can take penicillin  . Ivp Dye (Iodinated Diagnostic Agents) Other (See Comments)    convulsions  . Methadone Other (See Comments)    hallucinations  . Percocet (Oxycodone-Acetaminophen) Itching    Scratches til she bleeds  . Codeine Hives and Rash  . Ibuprofen Hives and Rash    Severe  rash per patient  . Lexapro Other (See Comments)    hallucinations  . Tylenol (Acetaminophen) Hives and Rash    Severe itching    Medications Prior to Admission  Medication Dose Route Frequency Provider Last Rate Last Dose  . 0.9 %  sodium chloride infusion   Intravenous Continuous Vickki Hearing, MD 100 mL/hr at 03/12/12 0553 800 mL at 03/12/12 0553  . antiseptic oral rinse (BIOTENE) solution 15 mL  15 mL Mouth Rinse TID Vickki Hearing, MD      . chlorhexidine (HIBICLENS) 4 % liquid 4 application  60 mL Topical Once Vickki Hearing, MD      . fentaNYL (SUBLIMAZE) injection 50 mcg  50 mcg Intravenous Once Candis Musa, PA   50 mcg at 03/12/12 0105  . fentaNYL (SUBLIMAZE) injection 50 mcg  50 mcg Intravenous Once Candis Musa, PA   50 mcg at 03/12/12 0150  . fentaNYL (SUBLIMAZE) injection 50 mcg  50 mcg Intravenous Q2H PRN Vickki Hearing, MD   50 mcg at 03/12/12 [phone]  . HYDROmorphone (DILAUDID) injection 0.5-1 mg  0.5-1 mg Intravenous Q2H PRN Vickki Hearing, MD      . methocarbamol (ROBAXIN) 500 mg in dextrose 5 % 50 mL IVPB  500 mg Intravenous Once Vickki Hearing, MD  500 mg at 03/12/12 0325  . ondansetron (ZOFRAN) injection 4 mg  4 mg Intravenous Q8H PRN Candis Musa, PA   4 mg at 03/12/12 0341  . traMADol (ULTRAM) tablet 50 mg  50 mg Oral Q6H Vickki Hearing, MD   50 mg at 03/12/12 0552  . vancomycin (VANCOCIN) IVPB 1000 mg/200 mL premix  1,000 mg Intravenous 60 min Pre-Op Vickki Hearing, MD       Medications Prior to Admission  Medication Sig Dispense Refill  . aspirin 325 MG tablet Take 650 mg by mouth every 6 (six) hours as needed. For pain        Results for orders placed during the hospital encounter of 03/11/12 (from the past 48 hour(s))  BASIC METABOLIC PANEL     Status: Abnormal   Collection Time   03/12/12 12:46 AM      Component Value Range Comment   Sodium 138  135 - 145 (mEq/L)    Potassium 3.6  3.5 - 5.1 (mEq/L)    Chloride 102  96 - 112  (mEq/L)    CO2 26  19 - 32 (mEq/L)    Glucose, Bld 105 (*) 70 - 99 (mg/dL)    BUN 5 (*) 6 - 23 (mg/dL)    Creatinine, Ser 6.21  0.50 - 1.10 (mg/dL)    Calcium 9.0  8.4 - 10.5 (mg/dL)    GFR calc non Af Amer >90  >90 (mL/min)    GFR calc Af Amer >90  >90 (mL/min)   CBC     Status: Abnormal   Collection Time   03/12/12 12:46 AM      Component Value Range Comment   WBC 8.7  4.0 - 10.5 (K/uL)    RBC 3.79 (*) 3.87 - 5.11 (MIL/uL)    Hemoglobin 11.2 (*) 12.0 - 15.0 (g/dL)    HCT 30.8 (*) 65.7 - 46.0 (%)    MCV 90.5  78.0 - 100.0 (fL)    MCH 29.6  26.0 - 34.0 (pg)    MCHC 32.7  30.0 - 36.0 (g/dL)    RDW 84.6 (*) 96.2 - 15.5 (%)    Platelets 230  150 - 400 (K/uL)   DIFFERENTIAL     Status: Normal   Collection Time   03/12/12 12:46 AM      Component Value Range Comment   Neutrophils Relative 72  43 - 77 (%)    Neutro Abs 6.2  1.7 - 7.7 (K/uL)    Lymphocytes Relative 16  12 - 46 (%)    Lymphs Abs 1.4  0.7 - 4.0 (K/uL)    Monocytes Relative 8  3 - 12 (%)    Monocytes Absolute 0.7  0.1 - 1.0 (K/uL)    Eosinophils Relative 4  0 - 5 (%)    Eosinophils Absolute 0.3  0.0 - 0.7 (K/uL)    Basophils Relative 0  0 - 1 (%)    Basophils Absolute 0.0  0.0 - 0.1 (K/uL)   URINALYSIS, ROUTINE W REFLEX MICROSCOPIC     Status: Normal   Collection Time   03/12/12  2:30 AM      Component Value Range Comment   Color, Urine YELLOW  YELLOW     APPearance CLEAR  CLEAR     Specific Gravity, Urine 1.025  1.005 - 1.030     pH 6.0  5.0 - 8.0     Glucose, UA NEGATIVE  NEGATIVE (mg/dL)    Hgb urine dipstick NEGATIVE  NEGATIVE     Bilirubin Urine NEGATIVE  NEGATIVE     Ketones, ur NEGATIVE  NEGATIVE (mg/dL)    Protein, ur NEGATIVE  NEGATIVE (mg/dL)    Urobilinogen, UA 0.2  0.0 - 1.0 (mg/dL)    Nitrite NEGATIVE  NEGATIVE     Leukocytes, UA NEGATIVE  NEGATIVE  MICROSCOPIC NOT DONE ON URINES WITH NEGATIVE PROTEIN, BLOOD, LEUKOCYTES, NITRITE, OR GLUCOSE <1000 mg/dL.  PREPARE RBC (CROSSMATCH)     Status: Normal    Collection Time   03/12/12  4:54 AM      Component Value Range Comment   Order Confirmation ORDER PROCESSED BY BLOOD BANK     TYPE AND SCREEN     Status: Normal (Preliminary result)   Collection Time   03/12/12  4:54 AM      Component Value Range Comment   ABO/RH(D) O POS      Antibody Screen NEG      Sample Expiration 03/15/2012      Unit Number [id number]      Blood Component Type RED CELLS,LR      Unit division 00      Status of Unit ALLOCATED      Transfusion Status OK TO TRANSFUSE      Crossmatch Result Compatible      Unit Number [id number]      Blood Component Type RED CELLS,LR      Unit division 00      Status of Unit ALLOCATED      Transfusion Status OK TO TRANSFUSE      Crossmatch Result Compatible     ABO/RH     Status: Normal   Collection Time   03/12/12  4:54 AM      Component Value Range Comment   ABO/RH(D) O POS      Dg Chest 1 View  03/12/2012  *RADIOLOGY REPORT*  Clinical Data: Fall  CHEST - 1 VIEW  Comparison: 12/26/2011  Findings: The lungs are essentially clear. No pleural effusion or pneumothorax.  Cardiomediastinal silhouette is within normal limits.  Surgical clips overlying the left upper abdomen.  IMPRESSION: No evidence of acute cardiopulmonary disease.  Original Report Authenticated By: Charline Bills, M.D.   Dg Hip Complete Left  03/12/2012  *RADIOLOGY REPORT*  Clinical Data: Fall, left hip pain  LEFT HIP - COMPLETE 2+ VIEW  Comparison: 01/23/2005  Findings: Mildly displaced left intertrochanteric hip fracture with resulting varus angulation.  No additional fractures are seen.  Prior lower lumbar vertebral augmentation.  IMPRESSION: Mild displaced left intertrochanteric hip fracture.  Per CMS PQRS reporting requirements (PQRS Measure 24): Given the patient's age of greater than 50 and the fracture site (hip), the patient should be tested for osteoporosis using DXA, and the appropriate treatment considered based on the DXA results.  Original Report Authenticated  By: Charline Bills, M.D.    Review of Systems  Musculoskeletal: Positive for myalgias, back pain, joint pain and falls.  All other systems reviewed and are negative.    Blood pressure 118/65, pulse 75, temperature 98.8 F (37.1 C), temperature source Oral, resp. rate 16, height 5\' 3"  (1.6 m), weight 88.542 kg (195 lb 3.2 oz), SpO2 96.00%. Physical Exam  Constitutional: She is oriented to person, place, and time. She appears well-developed and well-nourished. No distress.  HENT:  Head: Normocephalic.  Eyes: Pupils are equal, round, and reactive to light.  Neck: Normal range of motion. No JVD present. No tracheal deviation present. No thyromegaly present.  Cardiovascular: Normal rate and regular  rhythm.   Respiratory: Effort normal. No stridor.  GI: Soft. She exhibits no distension.  Musculoskeletal:       Upper extremity exam  Inspection and palpation revealed no abnormalities in the upper extremities.  Range of motion is full without contracture.  Motor exam is normal with grade 5 strength.  The joints are fully reduced without subluxation.  There is no atrophy or tremor and muscle tone is normal.  All joints are stable.    RIGHT Lower extremity exam  Inspection and palpation revealed no tenderness or abnormality in alignment in the lower extremities. Range of motion is full.  Strength is grade 5.  AND all joints are stable.  Left leg in traction but is neuro vascular motor intact   Lymphadenopathy:    She has no cervical adenopathy.  Neurological: She is alert and oriented to person, place, and time. She has normal reflexes.  Skin: Skin is warm. She is not diaphoretic.  Psychiatric: She has a normal mood and affect. Her behavior is normal. Judgment and thought content normal.     Assessment/Plan X-rays reveal a low neck/intertrochanteric fracture left hip with an intact posterior medial buttress which should be amenable to trochanteric nail fixation.  The risks  and benefits of the procedure been explained to the patient and the patient has accepted these risks. At her age group and medical situation there are really no other real alternatives.  Plan for gamma nail fixation left hip  Fuller Canada 03/12/2012, 10:18 AM

## 2012-03-12 NOTE — Transfer of Care (Signed)
Immediate Anesthesia Transfer of Care Note  Patient: Ellen Anderson  Procedure(s) Performed: Procedure(s) (LRB): INTRAMEDULLARY (IM) NAIL INTERTROCHANTRIC (Left)  Patient Location: PACU  Anesthesia Type: Spinal  Level of Consciousness: awake and patient cooperative  Airway & Oxygen Therapy: Patient Spontanous Breathing and Patient connected to face mask oxygen  Post-op Assessment: ;-:  Post vital signs: Reviewed and stable  Complications: No apparent anesthesia complications

## 2012-03-12 NOTE — Op Note (Signed)
Preop diagnosis LEFT HIP FRACTURE  Postop diagnosis same Procedure IM NAIL TROCHANTERIC/GAMMA NAIL  125 NAIL, 105 LAG, 45 LOCKING DYNAMIC, ACORN SLIDING MODE  Surgeon Romeo Apple Assisted by NONE  Operative findings 3 PART LOW CERVICAL INTERTROCHANTERIC FX WITH INTACT PM BUTTRESS Implants 125 GAMMA NAIL, 105 LAG, ACORN SLIDING, 45 LOCKING DYNAMIC   History FELL AT HOME   After site marking and chart update the patient was taken to the operating room where she received spinal anesthesia. She was placed on the fracture table with padding of the right leg. We noticed a left great to heel pressure sore and a grade 1 dorsal pressure sore also on the left foot.  In traction and manipulation of the limb was performed under C-arm guidance until a closed stable reduction was obtained. The leg was then prepped and draped sterilely. After completing the timeout a lateral incision was made over the greater trochanter extended proximally. Subcutaneous tissues were divided and the subcutaneous hemorrhage was controlled with electrocautery. Further dissection was carried out to the fascia which was split in line with the skin incision and then blunt dissection was carried out in the abductor musculature into the greater trochanter was palpated. A curved awl was passed into the greater trochanter into the femoral canal. This was confirmed by x-ray. A guidewire was placed into the cannulated awl and passed to the knee and confirmed by x-ray. Using manufacturer's recommended technique reaming was performed over the guidewire to the level of the lesser trochanter. 125 nail was passed over the guidewire. A lateral incision was made in preparation for the lag screw. Using the cannula and perforating drill the guidewire was placed across the fracture site into the femoral head. X-rays confirmed position. The pin was then measured. A 105 mm setting was placed on the reamer and the reamer was passed over the guidewire under  C-arm guidance. The 105  lag screw was placed. The acorn was then placed proximally. The acorn was placed in a dynamic mode. The acorn was confirmed to be engaged per manufacture a technique.  Traction was then released.  We then made a second stab wound laterally past the locking bolt cannula drilled across the nail and passed a 45 mm locking bolt.  Wounds were then irrigated;  radiographs confirmed position of the fracture and of the implant.  Layered closure was performed proximally with #1 VICRYL THEN 0 MONOCRYL and 2-0 Monocryl followed by reapproximation of skin edges with staples  The 2 distal incisions were closed with staples.  A total of 60 cc of Marcaine was injected into the wound area with 30 cc injected beneath the fascial layer and 30 cc in the subcutaneous layer.  Sterile dressing was applied  The patient was taken to recovery room in stable condition  Postop plan is for full weightbearing.

## 2012-03-12 NOTE — ED Notes (Signed)
Pt taken to xray 

## 2012-03-12 NOTE — Anesthesia Postprocedure Evaluation (Signed)
  Anesthesia Post-op Note  Patient: Ellen Anderson  Procedure(s) Performed: Procedure(s) (LRB): INTRAMEDULLARY (IM) NAIL INTERTROCHANTRIC (Left)  Patient Location: PACU  Anesthesia Type: Spinal  Level of Consciousness: awake, alert , oriented and patient cooperative  Airway and Oxygen Therapy: Patient Spontanous Breathing  Post-op Pain: 3 /10, mild  Post-op Assessment: Post-op Vital signs reviewed, Patient's Cardiovascular Status Stable, Respiratory Function Stable, Patent Airway, No signs of Nausea or vomiting and Pain level controlled  Post-op Vital Signs: Reviewed and stable  Complications: No apparent anesthesia complications

## 2012-03-12 NOTE — ED Provider Notes (Signed)
Medical screening examination/treatment/procedure(s) were performed by non-physician practitioner and as supervising physician I was immediately available for consultation/collaboration.  Geoffery Lyons, MD 03/12/12 [phone]

## 2012-03-12 NOTE — Brief Op Note (Signed)
03/11/2012 - 03/12/2012  5:01 PM  PATIENT:  Glory Rosebush  66 y.o. female  PRE-OPERATIVE DIAGNOSIS:  left hip fracture  POST-OPERATIVE DIAGNOSIS:  left hip fracture  FINDINGS: INTACT PM BUTTRESS 3 PT FRACTURE   PROCEDURE:  Procedure(s) (LRB): INTRAMEDULLARY (IM) NAIL INTERTROCHANTRIC (Left)/GAMMA NAIL   SURGEON:  Surgeon(s) and Role:    * Vickki Hearing, MD - Primary  PHYSICIAN ASSISTANT:   ASSISTANTS: none   ANESTHESIA:   spinal  EBL:  Total I/O In: 800 [I.V.:800] Out: 450 [Urine:350; Blood:100]  BLOOD ADMINISTERED:none  DRAINS: none   LOCAL MEDICATIONS USED:  MARCAINE  WITH EPI 60 CC   SPECIMEN:  No Specimen  DISPOSITION OF SPECIMEN:  N/A  COUNTS:  YES  TOURNIQUET:  * No tourniquets in log *  DICTATION: .Dragon Dictation  PLAN OF CARE: Admit to inpatient   PATIENT DISPOSITION:  PACU - hemodynamically stable.   Delay start of Pharmacological VTE agent (>24hrs) due to surgical blood loss or risk of bleeding: yes

## 2012-03-12 NOTE — Anesthesia Preprocedure Evaluation (Addendum)
Anesthesia Evaluation  Patient identified by MRN, date of birth, ID band Patient awake    Reviewed: Allergy & Precautions, H&P , NPO status , Patient's Chart, lab work & pertinent test results, reviewed documented beta blocker date and time   Airway Mallampati: II TM Distance: >3 FB Neck ROM: Full    Dental  (+) Edentulous Lower and Edentulous Upper   Pulmonary  CXR 03-12-12 : No acute cardiopulmonary findings breath sounds clear to auscultation        Cardiovascular hypertension, Rhythm:Regular Rate:Normal  12 Lead EKG: NSR T wave abnormality    Neuro/Psych Seizures -, Well Controlled,  Chronic Back Pain    GI/Hepatic   Endo/Other    Renal/GU    UTI    Musculoskeletal   Abdominal   Peds  Hematology anemia   Anesthesia Other Findings Last liquid 6:00pm 03-11-12 Last solid  6:00pm  03-11-12  Reproductive/Obstetrics                         Anesthesia Physical Anesthesia Plan  ASA: III  Anesthesia Plan: Spinal   Post-op Pain Management:    Induction:   Airway Management Planned:   Additional Equipment:   Intra-op Plan:   Post-operative Plan:   Informed Consent: I have reviewed the patients History and Physical, chart, labs and discussed the procedure including the risks, benefits and alternatives for the proposed anesthesia with the patient or authorized representative who has indicated his/her understanding and acceptance.   Dental Advisory Given  Plan Discussed with: Anesthesiologist and Surgeon  Anesthesia Plan Comments:         Anesthesia Quick Evaluation

## 2012-03-12 NOTE — Anesthesia Procedure Notes (Signed)
Spinal  Patient location during procedure: OR Start time: 03/12/2012 3:08 PM Staffing CRNA/Resident: Glynn Octave E Preanesthetic Checklist Completed: patient identified, site marked, surgical consent, pre-op evaluation, timeout performed, IV checked, risks and benefits discussed and monitors and equipment checked Spinal Block Patient position: left lateral decubitus Prep: Betadine Patient monitoring: heart rate, cardiac monitor, continuous pulse ox and blood pressure Approach: left paramedian Location: L3-4 Injection technique: single-shot Needle Needle type: Spinocan  Needle gauge: 22 G Needle length: 9 cm Assessment Sensory level: T8 Additional Notes  ATTEMPTS:2 ;Attemptx1 by Avrielle Fry Andraza,CRNA;  Spinal placed by Glynn Octave, CRNA x 1 attempt;   Bupivacaine .75% 2 ml  Fentanyl 20 mcg Epi.1 TRAY [id number] TRAY EXPIRATION DATE: 10/2012

## 2012-03-12 NOTE — ED Provider Notes (Signed)
History     CSN: [id number]  Arrival date & time 03/11/12  2257   First MD Initiated Contact with Patient 03/11/12 2346      Chief Complaint  Patient presents with  . Fall  . Hip Pain    (Consider location/radiation/quality/duration/timing/severity/associated sxs/prior treatment) HPI Comments: Patient was getting out of her chair this evening when she tripped on her sock and fell landing on her left hip.  She denies head injury or LOC.  Also denies pain except at left hip.  Patient is a 66 y.o. female presenting with fall and hip pain. The history is provided by the patient.  Fall The accident occurred less than 1 hour ago. The fall occurred while walking. She landed on carpet. The point of impact was the left hip. The pain is present in the left hip. The pain is at a severity of 10/10. The pain is severe. She was not ambulatory at the scene. Pertinent negatives include no fever, no numbness, no abdominal pain, no bowel incontinence, no nausea, no vomiting, no headaches, no loss of consciousness and no tingling. The symptoms are aggravated by pressure on the injury and use of the injured limb. She has tried immobilization for the symptoms. The treatment provided mild relief.  Hip Pain Associated symptoms include arthralgias. Pertinent negatives include no abdominal pain, chest pain, congestion, fever, headaches, joint swelling, nausea, neck pain, numbness, rash, sore throat, vomiting or weakness.    Past Medical History  Diagnosis Date  . Degenerative joint disease   . Chronic low back pain     Past Surgical History  Procedure Date  . Back surgery   . Ankle surgery   . Abdominal hysterectomy   . Cholecystectomy   . Appendectomy     Family History  Problem Relation Age of Onset  . Throat cancer Mother   . COPD Father     History  Substance Use Topics  . Smoking status: Never Smoker   . Smokeless tobacco: Never Used  . Alcohol Use: No    OB History    Grav Para Term  Preterm Abortions TAB SAB Ect Mult Living                  Review of Systems  Constitutional: Negative for fever.  HENT: Negative for congestion, sore throat and neck pain.   Eyes: Negative.   Respiratory: Negative for chest tightness and shortness of breath.   Cardiovascular: Negative for chest pain.  Gastrointestinal: Negative for nausea, vomiting, abdominal pain and bowel incontinence.  Genitourinary: Negative.   Musculoskeletal: Positive for arthralgias and gait problem. Negative for joint swelling.  Skin: Negative.  Negative for rash and wound.  Neurological: Negative for dizziness, tingling, loss of consciousness, weakness, light-headedness, numbness and headaches.  Hematological: Negative.   Psychiatric/Behavioral: Negative.     Allergies  Augmentin; Ceclor; Morphine and related; Sulfur; Azithromycin; Ivp dye; Methadone; Percocet; Codeine; Ibuprofen; Lexapro; and Tylenol  Home Medications   Current Outpatient Rx  Name Route Sig Dispense Refill  . ASPIRIN 325 MG PO TABS Oral Take 650 mg by mouth every 6 (six) hours as needed. For pain      BP 121/59  Pulse 70  Resp 16  Ht 5\' 2"  (1.575 m)  Wt 182 lb (82.555 kg)  BMI 33.29 kg/m2  SpO2 97%  Physical Exam  Nursing note and vitals reviewed. Constitutional: She is oriented to person, place, and time. She appears well-developed and well-nourished.  HENT:  Head: Normocephalic and atraumatic.  Mouth/Throat: Oropharynx is clear and moist.  Eyes: Conjunctivae are normal.  Neck: Normal range of motion. Neck supple.  Cardiovascular: Normal rate, regular rhythm, normal heart sounds and intact distal pulses.   Pulses:      Dorsalis pedis pulses are 2+ on the right side, and 2+ on the left side.  Pulmonary/Chest: Effort normal and breath sounds normal. She has no wheezes.  Abdominal: Soft. Bowel sounds are normal. There is no tenderness.  Musculoskeletal: She exhibits tenderness.       Left hip: She exhibits tenderness.        Tender to palpation left groin.  Left leg is externally rotated and slightly shortened.  Neurological: She is alert and oriented to person, place, and time.  Skin: Skin is warm and dry.  Psychiatric: She has a normal mood and affect.    ED Course  Procedures (including critical care time)  Labs Reviewed  BASIC METABOLIC PANEL - Abnormal; Notable for the following:    Glucose, Bld 105 (*)    BUN 5 (*)    All other components within normal limits  CBC - Abnormal; Notable for the following:    RBC 3.79 (*)    Hemoglobin 11.2 (*)    HCT 34.3 (*)    RDW 15.9 (*)    All other components within normal limits  DIFFERENTIAL  URINALYSIS, ROUTINE W REFLEX MICROSCOPIC   Dg Chest 1 View  03/12/2012  *RADIOLOGY REPORT*  Clinical Data: Fall  CHEST - 1 VIEW  Comparison: 12/26/2011  Findings: The lungs are essentially clear. No pleural effusion or pneumothorax.  Cardiomediastinal silhouette is within normal limits.  Surgical clips overlying the left upper abdomen.  IMPRESSION: No evidence of acute cardiopulmonary disease.  Original Report Authenticated By: Charline Bills, M.D.   Dg Hip Complete Left  03/12/2012  *RADIOLOGY REPORT*  Clinical Data: Fall, left hip pain  LEFT HIP - COMPLETE 2+ VIEW  Comparison: 01/23/2005  Findings: Mildly displaced left intertrochanteric hip fracture with resulting varus angulation.  No additional fractures are seen.  Prior lower lumbar vertebral augmentation.  IMPRESSION: Mild displaced left intertrochanteric hip fracture.  Per CMS PQRS reporting requirements (PQRS Measure 24): Given the patient's age of greater than 50 and the fracture site (hip), the patient should be tested for osteoporosis using DXA, and the appropriate treatment considered based on the DXA results.  Original Report Authenticated By: Charline Bills, M.D.     1. Closed left hip fracture       MDM  Spoke with Dr. Romeo Apple who will this patient.  Admission orders given.   Date: 03/12/2012   Rate: 71  Rhythm: normal sinus rhythm  QRS Axis: normal  Intervals: normal  ST/T Wave abnormalities: nonspecific ST/T changes  Conduction Disutrbances:none  Narrative Interpretation:   Old EKG Reviewed: unchanged          Candis Musa, PA 03/12/12 [phone]

## 2012-03-13 ENCOUNTER — Encounter (HOSPITAL_COMMUNITY): Payer: Medicare Other

## 2012-03-13 LAB — BASIC METABOLIC PANEL
CO2: 31 mEq/L (ref 19–32)
Chloride: 102 mEq/L (ref 96–112)
GFR calc Af Amer: >90 mL/min (ref >90)
Potassium: 3.2 mEq/L — ABNORMAL LOW (ref 3.5–5.1)

## 2012-03-13 LAB — CBC
Platelets: 175 10*3/uL (ref 150–400)
RBC: 3.26 MIL/uL — ABNORMAL LOW (ref 3.87–5.11)
RDW: 15.8 % — ABNORMAL HIGH (ref 11.5–15.5)
WBC: 6.7 10*3/uL (ref 4.0–10.5)

## 2012-03-13 MED ORDER — VANCOMYCIN HCL IN DEXTROSE 1-5 GM/200ML-% IV SOLN
INTRAVENOUS | Status: AC
  Filled 2012-03-13: qty 200

## 2012-03-13 NOTE — Evaluation (Signed)
Physical Therapy Evaluation Patient Details Name: Ellen Anderson MRN: [id number] DOB: 04/13/1946 Today's Date: 03/13/2012  Problem List:  Patient Active Problem List  Diagnoses  . Seizure  . UTI (lower urinary tract infection)  . Anemia  . Back pain, chronic  . Hip fracture, left    Past Medical History:  Past Medical History  Diagnosis Date  . Degenerative joint disease   . Chronic low back pain   . Hypertension   . Seizures     Had "convulsions", not sure if seizure after hitting head when falling in snow  . Anemia    Past Surgical History:  Past Surgical History  Procedure Date  . Back surgery   . Ankle surgery   . Abdominal hysterectomy   . Cholecystectomy   . Appendectomy     PT Assessment/Plan/Recommendation PT Assessment Clinical Impression Statement: pt very cooperative but having quite a bit of pain...she was medicated for this prior to tx...ther ex done per protocol but we were unable to mobilize OOB after numerous attemps due to fatigue and pain...she was finally returned to supine...she is hoping to go home at d/c, but there is a strong possibility that she will need  SNF instead PT Recommendation/Assessment: Patient will need skilled PT in the acute care venue PT Problem List: Decreased strength;Decreased range of motion;Decreased activity tolerance;Decreased mobility;Decreased knowledge of use of DME;Decreased knowledge of precautions;Obesity;Pain Barriers to Discharge: None PT Therapy Diagnosis : Difficulty walking;Generalized weakness;Acute pain PT Plan PT Frequency: 7X/week PT Treatment/Interventions: Gait training;Functional mobility training;Therapeutic activities;Therapeutic exercise;Patient/family education PT Recommendation Follow Up Recommendations: Home health PT;Skilled nursing facility (to be determined) Equipment Recommended: Rolling walker with 5" wheels;3 in 1 bedside comode PT Goals  Acute Rehab PT Goals PT Goal Formulation: With  patient Time For Goal Achievement: 2 weeks Pt will go Supine/Side to Sit: with mod assist;with HOB not 0 degrees (comment degree) (HOB at 30 deg) PT Goal: Supine/Side to Sit - Progress: Goal set today Pt will go Sit to Supine/Side: with mod assist PT Goal: Sit to Supine/Side - Progress: Goal set today Pt will go Sit to Stand: with mod assist;with upper extremity assist PT Goal: Sit to Stand - Progress: Goal set today Pt will go Stand to Sit: with min assist PT Goal: Stand to Sit - Progress: Goal set today Pt will Transfer Bed to Chair/Chair to Bed: with min assist PT Transfer Goal: Bed to Chair/Chair to Bed - Progress: Goal set today Pt will Ambulate: 1 - 15 feet;with rolling walker;with min assist PT Goal: Ambulate - Progress: Goal set today  PT Evaluation Precautions/Restrictions  Precautions Precautions: Fall Required Braces or Orthoses: No Restrictions LLE Weight Bearing: Weight bearing as tolerated Prior Functioning  Home Living Lives With: Family Receives Help From: Family Type of Home: House Home Layout: One level Home Access: Ramped entrance Home Adaptive Equipment: Grab bars around toilet;Grab bars in shower Prior Function Level of Independence: Independent with basic ADLs;Independent with homemaking with ambulation;Independent with gait;Independent with transfers Driving: Yes Vocation: Retired Producer, television/film/video: Awake/alert Overall Cognitive Status: Appears within functional limits for tasks assessed Orientation Level: Oriented X4 Sensation/Coordination Sensation Light Touch: Appears Intact Stereognosis: Not tested Hot/Cold: Not tested Proprioception: Appears Intact Coordination Gross Motor Movements are Fluid and Coordinated: Yes Fine Motor Movements are Fluid and Coordinated: Yes Extremity Assessment RUE Assessment RUE Assessment: Within Functional Limits LUE Assessment LUE Assessment: Within Functional Limits RLE Assessment RLE  Assessment: Within Functional Limits LLE Assessment LLE Assessment: Not tested Mobility (including Balance)  Bed Mobility Bed Mobility: Yes Supine to Sit: HOB elevated (Comment degrees);3: Mod assist Supine to Sit Details (indicate cue type and reason): HOB elevated to the max, some assist needed to move LLE, but pt very desirous to do this independently Sitting - Scoot to Edge of Bed: 5: Supervision Sitting - Scoot to Edge of Bed Details (indicate cue type and reason): very slow and labored Sit to Supine: 1: +2 Total assist Scooting to [facility]:  (unable to scoot) Transfers Transfers: Yes Sit to Stand: 2: Max assist;From bed;With upper extremity assist Stand to Sit: 2: Max assist Stand to Sit Details: to control descent of trunk Stand Pivot Transfers: Other (comment) (multiple attempts made to transfer, but unable) Ambulation/Gait Ambulation/Gait: No Stairs: No Wheelchair Mobility Wheelchair Mobility: No  Posture/Postural Control Posture/Postural Control: No significant limitations Balance Balance Assessed: No Exercise  General Exercises - Lower Extremity Ankle Circles/Pumps: AROM;Both;10 reps;Supine Quad Sets: AROM;Both;10 reps;Supine Gluteal Sets: AROM;Both;10 reps;Supine Short Arc Quad: AAROM;Left;10 reps;Supine Heel Slides: AAROM;Left;10 reps;Supine End of Session PT - End of Session Equipment Utilized During Treatment: Gait belt Activity Tolerance: Patient limited by fatigue;Patient limited by pain Patient left: in bed;with call bell in reach;with bed alarm set General Behavior During Session: [facility] for tasks performed Cognition: [name] for tasks performed  Ellen Anderson 03/13/2012, 10:08 AM

## 2012-03-13 NOTE — Progress Notes (Signed)
Foley removed at 19:30 on 03/13/2012.     Reece Packer, RN

## 2012-03-13 NOTE — Anesthesia Postprocedure Evaluation (Signed)
  Anesthesia Post-op Note  Patient: Ellen Anderson  Procedure(s) Performed: Procedure(s) (LRB): INTRAMEDULLARY (IM) NAIL INTERTROCHANTRIC (Left)  Patient Location: Room 340  Anesthesia Type: Spinal  Level of Consciousness: awake, alert , oriented and patient cooperative  Airway and Oxygen Therapy: Patient Spontanous Breathing  Post-op Pain: mild  Post-op Assessment: Post-op Vital signs reviewed, Patient's Cardiovascular Status Stable, Respiratory Function Stable and Patent Airway  Post-op Vital Signs: Reviewed and stable  Complications: No apparent anesthesia complications

## 2012-03-13 NOTE — Addendum Note (Signed)
Addendum  created 03/13/12 1615 by Vedh Ptacek L Andraza, CRNA   Modules edited:Notes Section    

## 2012-03-13 NOTE — Progress Notes (Signed)
Subjective: 1 Day Post-Op Procedure(s) (LRB): INTRAMEDULLARY (IM) NAIL INTERTROCHANTRIC (Left) Patient reports pain as mild.    Objective: Vital signs in last 24 hours: Temp:  [97.6 F (36.4 C)-100.4 F (38 C)] 99.9 F (37.7 C) (04/07 1029) Pulse Rate:  [34-102] 98  (04/07 1029) Resp:  [14-22] 16  (04/07 1029) BP: (90-136)/(43-88) 90/57 mmHg (04/07 1029) SpO2:  [9 %-99 %] 92 % (04/07 1029)  Intake/Output from previous day: 04/06 0701 - 04/07 0700 In: 950 [P.O.:150; I.V.:800] Out: 1250 [Urine:1150; Blood:100] Intake/Output this shift: Total I/O In: 120 [P.O.:120] Out: 350 [Urine:350]   Basename 03/13/12 0416 03/12/12 0046  HGB 9.6* 11.2*    Basename 03/13/12 0416 03/12/12 0046  WBC 6.7 8.7  RBC 3.26* 3.79*  HCT 29.9* 34.3*  PLT 175 230    Basename 03/13/12 0416 03/12/12 0046  NA 138 138  K 3.2* 3.6  CL 102 102  CO2 31 26  BUN 4* 5*  CREATININE 0.60 0.58  GLUCOSE 159* 105*  CALCIUM 8.2* 9.0   No results found for this basename: LABPT:2,INR:2 in the last 72 hours  Neurologically intact  Assessment/Plan: 1 Day Post-Op Procedure(s) (LRB): INTRAMEDULLARY (IM) NAIL INTERTROCHANTRIC (Left) Advance diet Up with therapy D/C IV fluids  Fuller Canada 03/13/2012, 10:33 AM

## 2012-03-13 NOTE — Progress Notes (Signed)
Clinical Social Work Department BRIEF PSYCHOSOCIAL ASSESSMENT 03/13/2012  Patient:  Ellen Anderson, Ellen Anderson     Account Number:  [id number]     Admit date:  03/11/2012  Clinical Social Worker:  Dennison Bulla  Date/Time:  03/13/2012 11:30 AM  Referred by:  Physician  Date Referred:  03/13/2012 Referred for  SNF Placement   Other Referral:   Interview type:  Patient Other interview type:    PSYCHOSOCIAL DATA Living Status:  FAMILY Admitted from facility:   Level of care:   Primary support name:  Ellen Anderson Primary support relationship to patient:  CHILD, ADULT Degree of support available:   Unknown at this time    CURRENT CONCERNS Current Concerns  Post-Acute Placement   Other Concerns:    SOCIAL WORK ASSESSMENT / PLAN CSW received referral to assist with dc planning. CSW reviewed PT evaluation which stated that patient would most likely need SNF at dc. Per chart, patient has been alert and oriented times 4 throughout hospital stay. CSW met with patient at bedside. Patient reports that her son and his girlfriend currently stay at her house. Patient reports that son works during the day and that girlfriend is not usually home throughout the day. CSW spoke with patient regarding her dc plans. Patient reports that she wants to return home and refuses SNF at this time. CSW spoke with patient regarding her ability to ambulate in the hospital and her safety if she chooses to go home. Patient did admit that she has not been ambulating well but reports that she still wants to go home. Patient acknowledged that PT and RN has suggested SNF but she prefers HH. CSW has completed FL2 in case patient chooses to go to SNF. CSW will follow up with patient again regarding HH vs SNF. If patient does choose HH, CSW requests a CSW follow at patient at home to assist with placement if patient later decides SNF is a better option. CSW will continue to follow for now.   Assessment/plan status:  Psychosocial  Support/Ongoing Assessment of Needs Other assessment/ plan:   Information/referral to community resources:   [facility]    PATIENT'S/FAMILY'S RESPONSE TO PLAN OF CARE: Patient was oriented and reports that she does not wish to go to SNF. Patient continued to report that she would be safe at home and refuses SNF. Patient was not receptive to discussing SNF and was avoiding questions regarding her safety. Patient did agree that if she went home and was not doing well she would consider SNF at that point.

## 2012-03-13 NOTE — Addendum Note (Signed)
Addendum  created 03/13/12 1615 by Marolyn Hammock, CRNA   Modules edited:Notes Section

## 2012-03-14 ENCOUNTER — Telehealth: Payer: Self-pay | Admitting: Orthopedic Surgery

## 2012-03-14 LAB — BASIC METABOLIC PANEL
BUN: 7 mg/dL (ref 6–23)
CO2: 30 mEq/L (ref 19–32)
Chloride: 103 mEq/L (ref 96–112)
GFR calc Af Amer: 63 mL/min — ABNORMAL LOW (ref >90)
Glucose, Bld: 110 mg/dL — ABNORMAL HIGH (ref 70–99)
Potassium: 3 mEq/L — ABNORMAL LOW (ref 3.5–5.1)

## 2012-03-14 LAB — CBC
HCT: 27.9 % — ABNORMAL LOW (ref 36.0–46.0)
Hemoglobin: 9.1 g/dL — ABNORMAL LOW (ref 12.0–15.0)
MCV: 91.5 fL (ref 78.0–100.0)
RBC: 3.05 MIL/uL — ABNORMAL LOW (ref 3.87–5.11)
RDW: 15.9 % — ABNORMAL HIGH (ref 11.5–15.5)
WBC: 9.1 10*3/uL (ref 4.0–10.5)

## 2012-03-14 MED ORDER — POTASSIUM CHLORIDE CRYS ER 20 MEQ PO TBCR
20.0000 meq | EXTENDED_RELEASE_TABLET | Freq: Three times a day (TID) | ORAL | Status: AC
  Administered 2012-03-14 – 2012-03-16 (×6): 20 meq via ORAL
  Filled 2012-03-14 (×6): qty 1

## 2012-03-14 NOTE — Plan of Care (Signed)
Problem: Phase I Progression Outcomes Goal: OOB as tolerated unless otherwise ordered Outcome: Not Progressing Patient refuses to get out of bed this am to bsc per third shift rn

## 2012-03-14 NOTE — Telephone Encounter (Signed)
Rhae Hammock  called this morning about his mother, Ellen Anderson (June 26, 2046).  He said he had to work today and did not get to see you  when you made your rounds this morning.  He asked if you will please call him at [phone]

## 2012-03-14 NOTE — Progress Notes (Signed)
Pt refuses any PT this AM.  She request that I come "after lunch ...    I need to get my strength back first".   This pt is apparently refusing SNF,  but I don't think there is any way she will be able to manage this.  Currently, she is essentially bedbound.

## 2012-03-14 NOTE — Progress Notes (Signed)
Spoke with patient about possible SNF at d/c for rehab as she could benefit from this prior to returning home. She is agreeable to looking in to SNF in the [place] area and I will begin this process today and advise.  Reece Levy, MSW, Theresia Majors [phone]

## 2012-03-14 NOTE — Progress Notes (Signed)
Physical Therapy Treatment Patient Details Name: Ellen Anderson MRN: [id number] DOB: 21-Apr-1946 Today's Date: 03/14/2012  PT Assessment/Plan  PT - Assessment/Plan Comments on Treatment Session: Pt now aggreeable to PT.  We initiated tx per protocol and she tol ther ex without difficulty.  Transfer bed to chair was extremely slow and arduous, but pt was able to do this with walker and max assist.  Her progress is very slow and I strongly advised her to consider discharge to SNF at d/c...she is now willing to consider this.  Her sister has had a good experience at [facility] and so pt is interested in that facility. PT Plan: Discharge plan needs to be updated;Frequency remains appropriate Follow Up Recommendations: Skilled nursing facility Equipment Recommended: Defer to next venue PT Goals  Acute Rehab PT Goals PT Goal: Supine/Side to Sit - Progress: Progressing toward goal PT Goal: Sit to Stand - Progress: Progressing toward goal PT Goal: Stand to Sit - Progress: Progressing toward goal PT Transfer Goal: Bed to Chair/Chair to Bed - Progress: Progressing toward goal PT Goal: Ambulate - Progress: Not progressing  PT Treatment Precautions/Restrictions  Precautions Precautions: Fall Required Braces or Orthoses: No Restrictions Weight Bearing Restrictions: Yes LLE Weight Bearing: Touchdown weight bearing Mobility (including Balance) Bed Mobility Supine to Sit: 3: Mod assist;HOB elevated (Comment degrees) Supine to Sit Details (indicate cue type and reason): HOB elevated to 60 deg Sitting - Scoot to Edge of Bed: 5: Supervision Sitting - Scoot to [place] of Bed Details (indicate cue type and reason): extremely slow and labored...pt insist on doing this without assist Transfers Sit to Stand: 2: Max assist Stand to Sit: 3: Mod assist Stand Pivot Transfers: 2: Max assist Stand Pivot Transfer Details (indicate cue type and reason): extremely slow and labored...pt unable to lift RLE off of floor to  take a step Ambulation/Gait Ambulation/Gait: No Stairs: No Wheelchair Mobility Wheelchair Mobility: No    Exercise  General Exercises - Lower Extremity Ankle Circles/Pumps: AROM;Both;10 reps;Supine Quad Sets: AROM;Both;10 reps;Supine Gluteal Sets: AROM;Both;10 reps;Supine Short Arc Quad: AAROM;Left;10 reps;Supine Heel Slides: AAROM;Left;10 reps;Supine Hip ABduction/ADduction: AAROM;Left;10 reps;Supine End of Session PT - End of Session Equipment Utilized During Treatment: Gait belt Activity Tolerance: Patient tolerated treatment well;Patient limited by fatigue;Patient limited by pain Patient left: in chair;with call bell in reach;with family/visitor present Nurse Communication: Mobility status for transfers;Mobility status for ambulation General Behavior During Session: [facility] for tasks performed Cognition: [facility] for tasks performed  Konrad Penta 03/14/2012, 1:33 PM

## 2012-03-14 NOTE — Progress Notes (Addendum)
Got patient up to [facility], with 3 assists, but she did not void.  She had not voided since Foley had been removed at 19:30 last evening.  Bladder scan showed over 300 mL in bladder.  Spoke with Dr. Romeo Apple and he gave order to put Foley back in.  The charge nurse and myself convinced the patient to try to get up to the [facility] so the Foley would not have to be put back in and she reluctantly agreed.  Both Edgar Frisk, RN, and myself, tried for over 30 minutes to get patient up.  She stated she could do it and would not let us help, but we finally did have to help her to a semi-standing position.  She stayed up for 1-2 seconds, then sat back down and refused to try again.  She also has explicitly refused to try to use bedpan.  A 14-French Foley was inserted by Edgar Frisk, RN, at approximately 05:10 am.  Cloudy urine was returned.

## 2012-03-14 NOTE — Evaluation (Signed)
Occupational Therapy Evaluation Patient Details Name: Ellen Anderson MRN: [id number] DOB: 28-Oct-1946 Today's Date: 03/14/2012 [id number] Problem List:  Patient Active Problem List  Diagnoses  . Seizure  . UTI (lower urinary tract infection)  . Anemia  . Back pain, chronic  . Hip fracture, left    Past Medical History:  Past Medical History  Diagnosis Date  . Degenerative joint disease   . Chronic low back pain   . Hypertension   . Seizures     Had "convulsions", not sure if seizure after hitting head when falling in snow  . Anemia    Past Surgical History:  Past Surgical History  Procedure Date  . Back surgery   . Ankle surgery   . Abdominal hysterectomy   . Cholecystectomy   . Appendectomy     OT Assessment/Plan/Recommendation OT Assessment Clinical Impression Statement: A:  Patient presents with decreased I with B/IADLS, functional transfers, and functional ambulation s/p left hip fracture and repair.   OT Recommendation/Assessment: Patient will need skilled OT in the acute care venue OT Problem List: Decreased activity tolerance;Decreased safety awareness;Decreased knowledge of use of DME or AE;Pain OT Therapy Diagnosis : Acute pain OT Plan OT Frequency: Min 2X/week OT Treatment/Interventions: Self-care/ADL training;Therapeutic exercise;DME and/or AE instruction;Therapeutic activities OT Recommendation Follow Up Recommendations: Skilled nursing facility Equipment Recommended: Defer to next venue Individuals Consulted Consulted and Agree with Results and Recommendations: Patient OT Goals Acute Rehab OT Goals OT Goal Formulation: With patient Time For Goal Achievement: 2 weeks ADL Goals Pt Will Perform Grooming: with set-up;Standing at sink Pt Will Perform Lower Body Bathing: with min assist;Sitting, edge of bed;with adaptive equipment Pt Will Perform Upper Body Dressing: Sitting, bed;with set-up Pt Will Perform Lower Body Dressing: with min assist;Sitting,  bed;with adaptive equipment Pt Will Transfer to Toilet: with mod assist  OT Evaluation Precautions/Restrictions  Precautions Precautions: Fall Required Braces or Orthoses: No Restrictions Weight Bearing Restrictions: Yes LLE Weight Bearing: Touchdown weight bearing Prior Functioning Home Living Lives With: Family Receives Help From: Family Type of Home: House Home Layout: One level Home Access: Ramped entrance Bathroom Shower/Tub: Engineer, manufacturing systems: Handicapped height Bathroom Accessibility: Yes Home Adaptive Equipment: Grab bars in shower;Grab bars around toilet Prior Function Level of Independence: Independent with basic ADLs;Independent with homemaking with ambulation;Independent with gait;Independent with transfers Driving: Yes Vocation: Retired ADL ADL Eating/Feeding: Set up;Performed Eating/Feeding Details (indicate cue type and reason): setup to open soda and straw wrapper Where Assessed - Eating/Feeding: Chair Grooming: Performed;Brushing hair;Set up Grooming Details (indicate cue type and reason): would need someone to bring comb to her Where Assessed - Grooming: Sitting, chair Upper Body Bathing: Simulated;Set up Where Assessed - Upper Body Bathing: Sitting, chair Lower Body Bathing: Simulated;Maximal assistance Lower Body Bathing Details (indicate cue type and reason): would need use of AE to complete any lower body bathing Upper Body Dressing: Simulated;Minimal assistance Upper Body Dressing Details (indicate cue type and reason): to maneuver around IV Lower Body Dressing: Simulated;Maximal assistance Lower Body Dressing Details (indicate cue type and reason): would need to use AE to complete lower extremity dressing Ambulation Related to ADLs: would need extensive assist with ambulation and transfers.  Declined completing, as had just done so with physical therapy. ADL Comments: Patient will need educated on ADL completion at walker  level. Vision/Perception  Vision - History Baseline Vision: Wears glasses only for reading Cognition Cognition Arousal/Alertness: Awake/alert Overall Cognitive Status: Appears within functional limits for tasks assessed Orientation Level: Oriented X4 Sensation/Coordination Sensation Light  Touch: Appears Intact Stereognosis: Appears Intact Hot/Cold: Appears Intact Proprioception: Appears Intact Coordination Gross Motor Movements are Fluid and Coordinated: Yes Fine Motor Movements are Fluid and Coordinated: Yes Extremity Assessment RUE Assessment RUE Assessment: Within Functional Limits LUE Assessment LUE Assessment: Within Functional Limits Mobility  Completed by pt Exercises - n/a. End of Session OT - End of Session Activity Tolerance: Patient tolerated treatment well Patient left: in chair;with call bell in reach General Behavior During Session: [facility] for tasks performed Cognition: [facility] for tasks performed   Shirlean Mylar, OTR/L  03/14/2012, 1:49 PM

## 2012-03-14 NOTE — Progress Notes (Signed)
UR Chart Review Completed  

## 2012-03-14 NOTE — Progress Notes (Signed)
Subjective: 2 Days Post-Op Procedure(s) (LRB): INTRAMEDULLARY (IM) NAIL INTERTROCHANTRIC (Left) Patient reports pain as mild.    Objective: Vital signs in last 24 hours: Temp:  [98.9 F (37.2 C)-101.4 F (38.6 C)] 100.9 F (38.3 C) (04/08 1401) Pulse Rate:  [85-91] 85  (04/08 1401) Resp:  [16] 16  (04/08 1401) BP: (90-106)/(55-70) 98/55 mmHg (04/08 1401) SpO2:  [94 %-100 %] 100 % (04/08 1401)  Intake/Output from previous day: 04/07 0701 - 04/08 0700 In: 480 [P.O.:480] Out: 950 [Urine:950] Intake/Output this shift:     Basename 03/14/12 0530 03/13/12 0416 03/12/12 0046  HGB 9.1* 9.6* 11.2*    Basename 03/14/12 0530 03/13/12 0416  WBC 9.1 6.7  RBC 3.05* 3.26*  HCT 27.9* 29.9*  PLT 164 175    Basename 03/14/12 0530 03/13/12 0416  NA 138 138  K 3.0* 3.2*  CL 103 102  CO2 30 31  BUN 7 4*  CREATININE 1.04 0.60  GLUCOSE 110* 159*  CALCIUM 8.2* 8.2*   No results found for this basename: LABPT:2,INR:2 in the last 72 hours  Neurologically intact ABD soft Neurovascular intact Sensation intact distally  Assessment/Plan: 2 Days Post-Op Procedure(s) (LRB): INTRAMEDULLARY (IM) NAIL INTERTROCHANTRIC (Left) Up with therapy  Fuller Canada 03/14/2012, 5:24 PM

## 2012-03-14 NOTE — Progress Notes (Addendum)
Patient's temperature has gone from 99.9 to 100.9 to 101.4 during this shift.  Dr. Romeo Apple was called as instructed in his order if temperature was greater than 101.  Patient is allergic to ibuprofen and Tylenol.  Blankets were taken off of the patient that she is unhappy about and ice was put under armpits to get fever down.  Question if patient has a UTI due to cloudy urine returned when Foley was reinserted??    Reece Packer, RN

## 2012-03-15 ENCOUNTER — Inpatient Hospital Stay (HOSPITAL_COMMUNITY): Payer: Medicare Other

## 2012-03-15 LAB — CBC
MCV: 92.5 fL (ref 78.0–100.0)
Platelets: 170 10*3/uL (ref 150–400)
RDW: 15.9 % — ABNORMAL HIGH (ref 11.5–15.5)
WBC: 7.9 10*3/uL (ref 4.0–10.5)

## 2012-03-15 LAB — BASIC METABOLIC PANEL
Calcium: 8.6 mg/dL (ref 8.4–10.5)
Chloride: 100 mEq/L (ref 96–112)
Creatinine, Ser: 0.81 mg/dL (ref 0.50–1.10)
GFR calc Af Amer: 86 mL/min — ABNORMAL LOW (ref >90)
Sodium: 137 mEq/L (ref 135–145)

## 2012-03-15 LAB — URINE CULTURE
Colony Count: NO GROWTH
Culture  Setup Time: 201304090103
Culture: NO GROWTH

## 2012-03-15 MED ORDER — ALBUTEROL SULFATE (5 MG/ML) 0.5% IN NEBU: 2.5000 mg | INHALATION_SOLUTION | RESPIRATORY_TRACT | Status: DC | PRN

## 2012-03-15 MED ORDER — ALBUTEROL SULFATE (5 MG/ML) 0.5% IN NEBU
2.5000 mg | INHALATION_SOLUTION | Freq: Three times a day (TID) | RESPIRATORY_TRACT | Status: DC
  Administered 2012-03-15: 2.5 mg via RESPIRATORY_TRACT
  Filled 2012-03-15: qty 0.5

## 2012-03-15 NOTE — Progress Notes (Signed)
Physical Therapy Treatment Patient Details Name: Ellen Anderson MRN: [id number] DOB: 06-23-1946 Today's Date: 03/15/2012  TIME: 1042-1100/ 1 TE  PT Assessment/Plan  PT - Assessment/Plan Comments on Treatment Session: Pt did not sleep well last night and now is febrile 102.4 per nursing. However patient did agreee to bed exercises only, which patient completed slowly with AAROM to LLE. Educated pt on importance of getting out of bed and need to start WBAT, pt aknowledge importance ;will attempt transfers and gait tomorrow PT Goals  Acute Rehab PT Goals PT Goal: Supine/Side to Sit - Progress: Not met PT Goal: Sit to Supine/Side - Progress: Not met PT Goal: Sit to Stand - Progress: Not met PT Goal: Stand to Sit - Progress: Not met PT Transfer Goal: Bed to Chair/Chair to Bed - Progress: Not met PT Goal: Ambulate - Progress: Not met  PT Treatment Precautions/Restrictions  Precautions Precautions: Fall Required Braces or Orthoses: No Restrictions Weight Bearing Restrictions: Yes LLE Weight Bearing: Touchdown weight bearing Mobility (including Balance) Bed Mobility Bed Mobility: No (refused)    Exercise  General Exercises - Upper Extremity Shoulder Flexion: Both;10 reps General Exercises - Lower Extremity Ankle Circles/Pumps: Both;20 reps Quad Sets: Both;10 reps Short Arc Quad: Left;10 reps Heel Slides: Both;10 reps;AAROM (AAROM LLE) Hip ABduction/ADduction: Left;10 reps;AAROM End of Session PT - End of Session Activity Tolerance: Patient limited by fatigue;Treatment limited secondary to medical complications (Comment) (febrile ) Patient left: in chair;with call bell in reach;with bed alarm set General Behavior During Session: [facility] for tasks performed Cognition: [facility] for tasks performed  Arlinda Barcelona ATKINSO 03/15/2012, 11:08 AM  acute care

## 2012-03-15 NOTE — Progress Notes (Signed)
Pt temp 102.4 encouraged to use incentive spirometer and blankets removed from pt.  Ice Packs to both arms.

## 2012-03-15 NOTE — Progress Notes (Signed)
Patient has temp of 101.4, patient is allergic to tylenol and ibuprofen, applied ice packs under bilateral arms and also applied cool compress to forehead, also took off 2 layers of blankets off of patient, will recheck temp, held patient Tramadol this AM d/t being lethargic

## 2012-03-15 NOTE — Progress Notes (Signed)
Patient c/o discomfort under right breast, patient states that she unable to describe what it feels like, denies that is sharp or that it radiates any where, denies any shortness of breath, asymptomatic, VSS - BP 96/64, HR 78, R 18, O2 sat 100% on 2L, will monitor patient at this time, also c/o left hip pain, Dilaudid 0.5mg  IV given at this time

## 2012-03-15 NOTE — Progress Notes (Signed)
Clinical Social Work Department CLINICAL SOCIAL WORK PLACEMENT NOTE 03/15/2012  Patient:  NIKOLA, BLACKSTON  Account Number:  [id number] Admit date:  03/11/2012  Clinical Social Worker:  Robin Searing  Date/time:  03/15/2012 01:03 PM  Clinical Social Work is seeking post-discharge placement for this patient at the following level of care:   SKILLED NURSING   (*CSW will update this form in Epic as items are completed)   03/15/2012  Patient/family provided with [facility] Department of Clinical Social Work's list of facilities offering this level of care within the geographic area requested by the patient (or if unable, by the patient's family).  03/15/2012  Patient/family informed of their freedom to choose among providers that offer the needed level of care, that participate in Medicare, Medicaid or managed care program needed by the patient, have an available bed and are willing to accept the patient.  03/15/2012  Patient/family informed of [facility]' ownership interest in [facility], as well as of the fact that they are under no obligation to receive care at this facility.  PASARR submitted to EDS on 03/15/2012 PASARR number received from EDS on   FL2 transmitted to all facilities in geographic area requested by pt/family on  03/15/2012 FL2 transmitted to all facilities within larger geographic area on   Patient informed that his/her managed care company has contracts with or will negotiate with  certain facilities, including the following:     Patient/family informed of bed offers received:   Patient chooses bed at  Physician recommends and patient chooses bed at    Patient to be transferred to  on   Patient to be transferred to facility by   The following physician request were entered in Epic:   Additional Comments:   Reece Levy, MSW, [employer] [phone]

## 2012-03-15 NOTE — Progress Notes (Signed)
Pt encouraged to turn in bed and allow pillow support to offload sacrum every two hours during shift, pt refused every time.  Pt asked to allow bed pad to be changed with surgical incision change this evening, pt agreed.  No skin breakdown or pressure ulceration noted to pt skin.  Will continue to encourage turning while in bed.  Pt states "I reposition myself in the bed there is no need to prop me on a pillow".

## 2012-03-15 NOTE — Progress Notes (Signed)
   CARE MANAGEMENT NOTE 03/15/2012  Patient:  Ellen Anderson, Ellen Anderson   Account Number:  [id number]  Date Initiated:  03/15/2012  Documentation initiated by:  Sharrie Rothman  Subjective/Objective Assessment:   Pt admitted with hip fracture. Pt underwent surgery to fix hip and will now need SNF for rehab.     Action/Plan:   CSW is aware and is actively searching for bed. No CM needs.   Anticipated DC Date:  03/18/2012   Anticipated DC Plan:  SKILLED NURSING FACILITY  In-house referral  Clinical Social Worker      DC Planning Services  CM consult      Choice offered to / List presented to:             Status of service:  Completed, signed off Medicare Important Message given?   (If response is "NO", the following Medicare IM given date fields will be blank) Date Medicare IM given:   Date Additional Medicare IM given:    Discharge Disposition:  SKILLED NURSING FACILITY  Per UR Regulation:    If discussed at Long Length of Stay Meetings, dates discussed:    Comments:  03/15/12 1435 Arlyss Queen, RN BSN CM

## 2012-03-15 NOTE — Progress Notes (Signed)
Subjective: 3 Days Post-Op Procedure(s) (LRB): INTRAMEDULLARY (IM) NAIL INTERTROCHANTRIC (Left) Patient reports pain as moderate.    Objective: Vital signs in last 24 hours: Temp:  [99.1 F (37.3 C)-102.6 F (39.2 C)] 102.6 F (39.2 C) (04/09 0703) Pulse Rate:  [78-85] 80  (04/09 0535) Resp:  [16-18] 16  (04/09 0535) BP: (96-106)/(55-72) 106/72 mmHg (04/09 0535) SpO2:  [92 %-100 %] 92 % (04/09 0535)  Intake/Output from previous day: 04/08 0701 - 04/09 0700 In: 120 [P.O.:120] Out: 1550 [Urine:1550] Intake/Output this shift:     Basename 03/15/12 0618 03/14/12 0530 03/13/12 0416  HGB 9.0* 9.1* 9.6*    Basename 03/15/12 0618 03/14/12 0530  WBC 7.9 9.1  RBC 3.06* 3.05*  HCT 28.3* 27.9*  PLT 170 164    Basename 03/15/12 0618 03/14/12 0530  NA 137 138  K 3.4* 3.0*  CL 100 103  CO2 31 30  BUN 7 7  CREATININE 0.81 1.04  GLUCOSE 90 110*  CALCIUM 8.6 8.2*   No results found for this basename: LABPT:2,INR:2 in the last 72 hours  Neurologically intact Neurovascular intact Sensation intact distally Intact pulses distally Dorsiflexion/Plantar flexion intact Incision: dressing C/D/I  Assessment/Plan: 3 Days Post-Op Procedure(s) (LRB): INTRAMEDULLARY (IM) NAIL INTERTROCHANTRIC (Left) Advance diet Up with therapy D/C IV fluids  Fuller Canada 03/15/2012, 7:56 AM

## 2012-03-16 LAB — TYPE AND SCREEN
ABO/RH(D): O POS
Unit division: 0

## 2012-03-16 MED ORDER — DIAZEPAM 10 MG PO TABS: 10.0000 mg | ORAL_TABLET | Freq: Three times a day (TID) | ORAL | Status: DC | PRN

## 2012-03-16 MED ORDER — SODIUM CHLORIDE 0.9 % IJ SOLN
INTRAMUSCULAR | Status: AC
  Administered 2012-03-16: 12:00:00
  Filled 2012-03-16: qty 3

## 2012-03-16 MED ORDER — ENOXAPARIN SODIUM 30 MG/0.3ML ~~LOC~~ SOLN: 30.0000 mg | Freq: Every day | SUBCUTANEOUS | Status: DC

## 2012-03-16 MED ORDER — TRAMADOL HCL 50 MG PO TABS: 50.0000 mg | ORAL_TABLET | Freq: Four times a day (QID) | ORAL | Status: AC

## 2012-03-16 MED ORDER — POTASSIUM CHLORIDE CRYS ER 20 MEQ PO TBCR: 20.0000 meq | EXTENDED_RELEASE_TABLET | Freq: Three times a day (TID) | ORAL | Status: DC

## 2012-03-16 MED ORDER — METHOCARBAMOL 500 MG PO TABS: 500.0000 mg | ORAL_TABLET | Freq: Four times a day (QID) | ORAL | Status: AC | PRN

## 2012-03-16 MED ORDER — SENNA 8.6 MG PO TABS: 1.0000 | ORAL_TABLET | Freq: Two times a day (BID) | ORAL | Status: DC

## 2012-03-16 NOTE — Discharge Summary (Addendum)
Physician Discharge Summary  Patient ID: Ellen Anderson MRN: [id number] DOB/AGE: [birth year] 66 y.o.  Admit date: 03/11/2012 Discharge date: 03/16/2012  Admission Diagnoses: Left intertrochanteric hip fracture  Discharge Diagnoses: Same Principal Problem:  *Hip fracture, left   Discharged Condition: good  Hospital Course: The patient was admitted on April 5 with an intertrochanteric fracture the left hip. She had an uncomplicated hospital course she did have a fever which was worked up with a chest x-ray, urine culture and monitoring of her wound no source of infection was found. She was treated with respiratory therapy to improve her lung ventilation and possible atelectasis.  Consults: None  Significant Diagnostic Studies: labs:  CBC    Component Value Date/Time   WBC 7.9 03/15/2012 0618   RBC 3.06* 03/15/2012 0618   HGB 9.0* 03/15/2012 0618   HCT 28.3* 03/15/2012 0618   PLT 170 03/15/2012 0618   MCV 92.5 03/15/2012 0618   MCH 29.4 03/15/2012 0618   MCHC 31.8 03/15/2012 0618   RDW 15.9* 03/15/2012 0618   LYMPHSABS 1.4 03/12/2012 0046   MONOABS 0.7 03/12/2012 0046   EOSABS 0.3 03/12/2012 0046   BASOSABS 0.0 03/12/2012 0046      Treatments: respiratory therapy: albuterol/atropine nebulizer  Discharge Exam: Blood pressure 104/63, pulse 98, temperature 100.7 F (38.2 C), temperature source Oral, resp. rate 20, height 5\' 3"  (1.6 m), weight 88.542 kg (195 lb 3.2 oz), SpO2 97.00%. Incision/Wound:CLEAN   Disposition: Skilled nursing facility  Discharge Orders    Future Orders Please Complete By Expires   Diet - low sodium heart healthy      Call MD / Call 911      Comments:   If you experience chest pain or shortness of breath, CALL 911 and be transported to the hospital emergency room.  If you develope a fever above 101 F, pus (white drainage) or increased drainage or redness at the wound, or calf pain, call your surgeon's office.   Constipation Prevention      Comments:   Drink plenty of  fluids.  Prune juice may be helpful.  You may use a stool softener, such as Colace (over the counter) 100 mg twice a day.  Use MiraLax (over the counter) for constipation as needed.   Increase activity slowly as tolerated      Weight Bearing as taught in Physical Therapy      Comments:   Use a walker or crutches as instructed.   Discharge instructions      Comments:   WBAT LEFT LEG  STAPLES OUT April 20   F/U IN 2 WEEKS OFFICE  LOVENOX X 25 DAYS   DO NOT drive, shower or take a tub bath until instructed by your physician        Medication List  As of 03/16/2012  7:59 AM   STOP taking these medications         aspirin 325 MG tablet      meperidine 50 MG tablet      promethazine 25 MG tablet         TAKE these medications         diazepam 10 MG tablet   Commonly known as: VALIUM   Take 1 tablet (10 mg total) by mouth every 8 (eight) hours as needed. For muscle spasms      enoxaparin 30 MG/0.3ML injection   Commonly known as: LOVENOX   Inject 0.3 mLs (30 mg total) into the skin daily.  methocarbamol 500 MG tablet   Commonly known as: ROBAXIN   Take 1 tablet (500 mg total) by mouth every 6 (six) hours as needed.      potassium chloride SA 20 MEQ tablet   Commonly known as: K-DUR,KLOR-CON   Take 1 tablet (20 mEq total) by mouth 3 (three) times daily.      senna 8.6 MG Tabs   Commonly known as: SENOKOT   Take 1 tablet (8.6 mg total) by mouth 2 (two) times daily.      traMADol 50 MG tablet   Commonly known as: ULTRAM   Take 1 tablet (50 mg total) by mouth every 6 (six) hours.           Follow-up Information    Follow up with Alice Reichert, MD .         Signed: Fuller Canada 03/16/2012, 7:59 AM

## 2012-03-16 NOTE — Progress Notes (Signed)
Patient agreeable to plans for d/c today to [facility]. Plan transfer via EMS this pm. Son, Loraine Leriche, also aware and to assist with paperwork at SNF> Reece Levy, MSW, [place] [phone]

## 2012-03-16 NOTE — Progress Notes (Signed)
Subjective: 4 Days Post-Op Procedure(s) (LRB): INTRAMEDULLARY (IM) NAIL INTERTROCHANTRIC (Left) Patient reports pain as moderate.    Objective: Vital signs in last 24 hours: Temp:  [99.5 F (37.5 C)-102.9 F (39.4 C)] 100.7 F (38.2 C) (04/10 0500) Pulse Rate:  [85-108] 98  (04/10 0500) Resp:  [17-24] 20  (04/10 0500) BP: (97-138)/(62-77) 104/63 mmHg (04/10 0500) SpO2:  [77 %-99 %] 97 % (04/10 0500)  Intake/Output from previous day: 04/09 0701 - 04/10 0700 In: 300 [P.O.:300] Out: 1075 [Urine:1075] Intake/Output this shift:     Basename 03/15/12 0618 03/14/12 0530  HGB 9.0* 9.1*    Basename 03/15/12 0618 03/14/12 0530  WBC 7.9 9.1  RBC 3.06* 3.05*  HCT 28.3* 27.9*  PLT 170 164    Basename 03/15/12 0618 03/14/12 0530  NA 137 138  K 3.4* 3.0*  CL 100 103  CO2 31 30  BUN 7 7  CREATININE 0.81 1.04  GLUCOSE 90 110*  CALCIUM 8.6 8.2*   No results found for this basename: LABPT:2,INR:2 in the last 72 hours  Incision: dressing C/D/I and no drainage  Assessment/Plan: 4 Days Post-Op Procedure(s) (LRB): INTRAMEDULLARY (IM) NAIL INTERTROCHANTRIC (Left) Discharge to SNF  [facility] 03/16/2012, 7:27 AM

## 2012-03-16 NOTE — Progress Notes (Signed)
Called report to [place] at [facility].  Verbalized understanding. Pt dc'd to facility via EMS.

## 2012-03-30 ENCOUNTER — Encounter: Payer: Self-pay | Admitting: Orthopedic Surgery

## 2012-03-30 ENCOUNTER — Ambulatory Visit (INDEPENDENT_AMBULATORY_CARE_PROVIDER_SITE_OTHER): Payer: Medicare Other | Admitting: Orthopedic Surgery

## 2012-03-30 VITALS — BP 90/58 | Ht 63.0 in | Wt 195.0 lb

## 2012-03-30 DIAGNOSIS — S72002A Fracture of unspecified part of neck of left femur, initial encounter for closed fracture: Secondary | ICD-10-CM

## 2012-03-30 DIAGNOSIS — S72009A Fracture of unspecified part of neck of unspecified femur, initial encounter for closed fracture: Secondary | ICD-10-CM

## 2012-03-30 NOTE — Patient Instructions (Signed)
CONTINUE PHYSICAL THERAPY

## 2012-03-30 NOTE — Telephone Encounter (Signed)
No response from doctor, patient was seen in office this morning

## 2012-03-30 NOTE — Progress Notes (Signed)
Patient ID: Ellen Anderson, female   DOB: 1946-04-22, 66 y.o.   MRN: [id number] Chief Complaint  Patient presents with  . Follow-up    2 week follow up hip fracture, DOS 03/12/12    PRE-OPERATIVE DIAGNOSIS: left hip fracture  POST-OPERATIVE DIAGNOSIS: left hip fracture  FINDINGS: INTACT PM BUTTRESS 3 PT FRACTURE  PROCEDURE: Procedure(s) (LRB):  INTRAMEDULLARY (IM) NAIL INTERTROCHANTRIC (Left)/GAMMA NAIL   The incision is clean, dry, and intact, with no sign of infection.  Weightbearing status as tolerated, DVT prevention, is for 30 days total from surgery, which was April 6.  X-ray at 6 weeks  LEFT hip, short gamma nail

## 2012-04-27 ENCOUNTER — Ambulatory Visit (INDEPENDENT_AMBULATORY_CARE_PROVIDER_SITE_OTHER): Payer: Medicare Other

## 2012-04-27 ENCOUNTER — Ambulatory Visit (INDEPENDENT_AMBULATORY_CARE_PROVIDER_SITE_OTHER): Payer: Medicare Other | Admitting: Orthopedic Surgery

## 2012-04-27 ENCOUNTER — Encounter: Payer: Self-pay | Admitting: Orthopedic Surgery

## 2012-04-27 VITALS — BP 124/80 | Ht 63.0 in | Wt 161.0 lb

## 2012-04-27 DIAGNOSIS — S72009A Fracture of unspecified part of neck of unspecified femur, initial encounter for closed fracture: Secondary | ICD-10-CM

## 2012-04-27 DIAGNOSIS — S72002A Fracture of unspecified part of neck of left femur, initial encounter for closed fracture: Secondary | ICD-10-CM

## 2012-04-27 NOTE — Progress Notes (Signed)
Patient ID: Ellen Anderson, female   DOB: [birth year], 66 y.o.   MRN: [id number] Chief Complaint  Patient presents with  . Follow-up    4 week recheck left hip, DOS 03/12/12   BP 124/80  Ht 5\' 3"  (1.6 m)  Wt 161 lb (73.029 kg)  BMI 28.52 kg/m2  Postop visit #2 status post short gamma nail LEFT hip for intertrochanteric fracture, doing well ambulate with a walker. Mild pain.  X-ray shows fracture in good position. Hardware in good position.  Followup in 6 weeks

## 2012-06-01 ENCOUNTER — Ambulatory Visit (INDEPENDENT_AMBULATORY_CARE_PROVIDER_SITE_OTHER): Payer: Medicare Other

## 2012-06-01 ENCOUNTER — Ambulatory Visit (INDEPENDENT_AMBULATORY_CARE_PROVIDER_SITE_OTHER): Payer: Medicare Other | Admitting: Orthopedic Surgery

## 2012-06-01 ENCOUNTER — Encounter: Payer: Self-pay | Admitting: Orthopedic Surgery

## 2012-06-01 VITALS — BP 112/64 | Ht 63.0 in | Wt 153.0 lb

## 2012-06-01 DIAGNOSIS — M7072 Other bursitis of hip, left hip: Secondary | ICD-10-CM

## 2012-06-01 DIAGNOSIS — M25559 Pain in unspecified hip: Secondary | ICD-10-CM

## 2012-06-01 DIAGNOSIS — M76899 Other specified enthesopathies of unspecified lower limb, excluding foot: Secondary | ICD-10-CM

## 2012-06-01 MED ORDER — TRAMADOL HCL 50 MG PO TABS: 50.0000 mg | ORAL_TABLET | ORAL | Status: DC | PRN

## 2012-06-01 NOTE — Progress Notes (Signed)
Patient ID: Ellen Anderson, female   DOB: 1946/01/04, 66 y.o.   MRN: [id number] Chief Complaint  Patient presents with  . Hip Pain    left groin pain radiates down, left hip surgery 03/12/12    BP 112/64  Ht 5\' 3"  (1.6 m)  Wt 153 lb (69.4 kg)  BMI 27.10 kg/m2  The patient was scheduled for evaluation next week for her 12 week post gamma nail fixation x-ray. Her granddaughter ran into her this past weekend she started having pain over the greater trochanter where she says the granddaughters head hit her. She comes in one week early for evaluation of left hip pain over the greater trochanter. Nonradiating.  She progressed well stopped her physical therapy on her own she is using a cane. She takes tramadol for pain.  She has tenderness directly over the greater trochanter. Her hip flexion is normal is no pain with internal/external rotation she has some pain over the greater trochanter with adduction of the leg  Impression x-rays show no change in the hardware alignment the fracture looks healed  Recommend injection for bursitis.  Left hip injection  Diagnosis bursitis pre-and post.  Medication Depo-Medrol 40 mg. Medication lidocaine 1% 4 cc.  After confirming the site and timeout we injected the left greater trochanter. Sterile preparation of the skin with alcohol. Ethyl used for injection technique. Chloride for skin anesthesia. Tolerated well no complication    Followup as needed since the fracture healed

## 2012-06-01 NOTE — Patient Instructions (Signed)
You have received a steroid shot. 15% of patients experience increased pain at the injection site with in the next 24 hours. This is best treated with ice and tylenol extra strength 2 tabs every 8 hours. If you are still having pain please call the office.    

## 2012-06-08 ENCOUNTER — Ambulatory Visit: Payer: Medicare Other | Admitting: Orthopedic Surgery

## 2012-10-03 ENCOUNTER — Other Ambulatory Visit: Payer: Self-pay | Admitting: *Deleted

## 2012-10-03 DIAGNOSIS — M25559 Pain in unspecified hip: Secondary | ICD-10-CM

## 2012-10-03 MED ORDER — TRAMADOL HCL 50 MG PO TABS: 50.0000 mg | ORAL_TABLET | ORAL | Status: DC | PRN

## 2012-10-05 ENCOUNTER — Encounter: Payer: Self-pay | Admitting: Orthopedic Surgery

## 2012-10-05 ENCOUNTER — Telehealth: Payer: Self-pay | Admitting: Orthopedic Surgery

## 2012-10-05 ENCOUNTER — Ambulatory Visit (INDEPENDENT_AMBULATORY_CARE_PROVIDER_SITE_OTHER): Payer: Medicare Other | Admitting: Orthopedic Surgery

## 2012-10-05 VITALS — BP 90/60 | Ht 63.0 in | Wt 153.0 lb

## 2012-10-05 DIAGNOSIS — M549 Dorsalgia, unspecified: Secondary | ICD-10-CM

## 2012-10-05 MED ORDER — MEPERIDINE HCL 50 MG PO TABS: 50.0000 mg | ORAL_TABLET | ORAL | Status: DC | PRN

## 2012-10-05 MED ORDER — PROMETHAZINE HCL 25 MG PO TABS: 25.0000 mg | ORAL_TABLET | Freq: Four times a day (QID) | ORAL | Status: DC | PRN

## 2012-10-05 NOTE — Progress Notes (Signed)
Patient ID: Ellen Anderson, female   DOB: 1946-04-29, 66 y.o.   MRN: [id number] Chief Complaint  Patient presents with  . Follow-up    Recheck on left hip.    Status post gamma nail left hip  Presents with left buttock pain radiating down left thigh to left knee  Dr. Orpah Clinton did a three-level fusion type procedure on this patient. She did well until her hip surgery and since that time has had left leg pain  Left hip x-ray was done last visit shows no complications, fracture healed no prominent hardware  Today she is tender in the lumbar spine and left buttock.  Recommend she followup with a neurosurgeon. She is allergic to 11 medications. She can take Demerol she was given 90 tablets  She also needs to have chronic pain management  The current problems are out of the scope of our practice.

## 2012-10-05 NOTE — Patient Instructions (Addendum)
Refer to pain management center in [place]   Refer to neurosurgery recurrent back pain post back surgery

## 2012-10-05 NOTE — Telephone Encounter (Signed)
Patient called, request for appointment for hip pain recurring on operative hip, for which she had surgery in April of 2013 -- no injury.  She was last seen here in June and had an injection.  Appointment offered with ?Xray, but patient is having "a lot of pain."  Will an Xray be needed?   Her ph# is [phone].  Please advise

## 2012-10-05 NOTE — Telephone Encounter (Signed)
The x-ray doesn't matter if she needs one we'll get one if she doesn't we will not not sure Y.  getting a phonenote for this

## 2012-10-05 NOTE — Addendum Note (Signed)
Addended by: Adella Hare B on: 10/05/2012 03:49 PM   Modules accepted: Orders

## 2012-10-05 NOTE — Telephone Encounter (Signed)
Called patient, offered appointment today.

## 2012-10-06 ENCOUNTER — Telehealth: Payer: Self-pay | Admitting: *Deleted

## 2012-10-06 ENCOUNTER — Other Ambulatory Visit: Payer: Self-pay | Admitting: *Deleted

## 2012-10-06 DIAGNOSIS — M545 Low back pain: Secondary | ICD-10-CM

## 2012-10-06 NOTE — Telephone Encounter (Signed)
Referrals have been sent to [facility] and [facility] for chronic low back pain

## 2012-10-12 ENCOUNTER — Telehealth: Payer: Self-pay | Admitting: Orthopedic Surgery

## 2012-10-12 NOTE — Telephone Encounter (Signed)
Ellen Anderson with [facility] called today, said she was about to schedule Devoria Glassing for an appointment but saw that Hayle was discharged from the practice in 2005. zzzshe will let the patient know. Looks like Marijean Niemann also referred her to another practice.

## 2012-10-25 ENCOUNTER — Telehealth: Payer: Self-pay | Admitting: Orthopedic Surgery

## 2012-10-25 NOTE — Telephone Encounter (Signed)
I faxed a referral to Dr. Gerilyn Pilgrim for pain management for Ellen Anderson

## 2012-11-07 ENCOUNTER — Telehealth: Payer: Self-pay | Admitting: Orthopedic Surgery

## 2012-11-07 NOTE — Telephone Encounter (Signed)
Im sorry we can not prescribe demerol  Any more   Recommend she followup with a neurosurgeon. She is allergic to 11 medications. She can take Demerol she was given 90 tablets  She also needs to have chronic pain management  The current problems are out of the scope of our practice.

## 2012-11-07 NOTE — Telephone Encounter (Signed)
Ellen Anderson came by the office requesting a new prescription for Demerol 50 mg.  The pharmacy is sending a request for the Phenergan 25 mg. We referred her to [facility], but they declined to schedule her there.  On 10/25/12 we referred her to Dr. Gerilyn Pilgrim, but she has not heard from them yet.  She uses [facility]

## 2012-11-08 NOTE — Telephone Encounter (Signed)
No refills

## 2012-11-08 NOTE — Telephone Encounter (Signed)
Doneen Poisson spoke with patient, advised her of doctor's reply

## 2013-03-04 ENCOUNTER — Emergency Department (HOSPITAL_COMMUNITY): Payer: Medicare Other

## 2013-03-04 ENCOUNTER — Inpatient Hospital Stay (HOSPITAL_COMMUNITY)
Admission: EM | Admit: 2013-03-04 | Discharge: 2013-03-10 | DRG: 481 | Disposition: A | Discharge status: Skilled Nursing Facility | Payer: Medicare Other | Attending: Family Medicine | Admitting: Family Medicine

## 2013-03-04 ENCOUNTER — Encounter (HOSPITAL_COMMUNITY): Payer: Self-pay | Admitting: Emergency Medicine

## 2013-03-04 DIAGNOSIS — D62 Acute posthemorrhagic anemia: Secondary | ICD-10-CM | POA: Diagnosis present

## 2013-03-04 DIAGNOSIS — Z882 Allergy status to sulfonamides status: Secondary | ICD-10-CM

## 2013-03-04 DIAGNOSIS — S72143A Displaced intertrochanteric fracture of unspecified femur, initial encounter for closed fracture: Principal | ICD-10-CM | POA: Diagnosis present

## 2013-03-04 DIAGNOSIS — Y92009 Unspecified place in unspecified non-institutional (private) residence as the place of occurrence of the external cause: Secondary | ICD-10-CM

## 2013-03-04 DIAGNOSIS — Z885 Allergy status to narcotic agent status: Secondary | ICD-10-CM

## 2013-03-04 DIAGNOSIS — G8929 Other chronic pain: Secondary | ICD-10-CM | POA: Diagnosis present

## 2013-03-04 DIAGNOSIS — I10 Essential (primary) hypertension: Secondary | ICD-10-CM | POA: Diagnosis present

## 2013-03-04 DIAGNOSIS — S72141A Displaced intertrochanteric fracture of right femur, initial encounter for closed fracture: Secondary | ICD-10-CM

## 2013-03-04 DIAGNOSIS — E876 Hypokalemia: Secondary | ICD-10-CM | POA: Diagnosis present

## 2013-03-04 DIAGNOSIS — W19XXXA Unspecified fall, initial encounter: Secondary | ICD-10-CM | POA: Diagnosis present

## 2013-03-04 DIAGNOSIS — Z79899 Other long term (current) drug therapy: Secondary | ICD-10-CM

## 2013-03-04 DIAGNOSIS — F411 Generalized anxiety disorder: Secondary | ICD-10-CM | POA: Diagnosis present

## 2013-03-04 DIAGNOSIS — Z886 Allergy status to analgesic agent status: Secondary | ICD-10-CM

## 2013-03-04 DIAGNOSIS — M1990 Unspecified osteoarthritis, unspecified site: Secondary | ICD-10-CM | POA: Diagnosis present

## 2013-03-04 DIAGNOSIS — Z881 Allergy status to other antibiotic agents status: Secondary | ICD-10-CM

## 2013-03-04 DIAGNOSIS — Z91041 Radiographic dye allergy status: Secondary | ICD-10-CM

## 2013-03-04 DIAGNOSIS — D649 Anemia, unspecified: Secondary | ICD-10-CM | POA: Diagnosis present

## 2013-03-04 DIAGNOSIS — S72001A Fracture of unspecified part of neck of right femur, initial encounter for closed fracture: Secondary | ICD-10-CM

## 2013-03-04 DIAGNOSIS — M549 Dorsalgia, unspecified: Secondary | ICD-10-CM | POA: Diagnosis present

## 2013-03-04 NOTE — ED Notes (Signed)
Patient was sitting on coffee table and it flipped with her.  Patient c/o right hip pain.

## 2013-03-05 ENCOUNTER — Emergency Department (HOSPITAL_COMMUNITY): Payer: Medicare Other

## 2013-03-05 ENCOUNTER — Encounter (HOSPITAL_COMMUNITY): Payer: Self-pay | Admitting: Internal Medicine

## 2013-03-05 DIAGNOSIS — S72009A Fracture of unspecified part of neck of unspecified femur, initial encounter for closed fracture: Secondary | ICD-10-CM

## 2013-03-05 DIAGNOSIS — E876 Hypokalemia: Secondary | ICD-10-CM | POA: Diagnosis present

## 2013-03-05 DIAGNOSIS — G8929 Other chronic pain: Secondary | ICD-10-CM

## 2013-03-05 DIAGNOSIS — D649 Anemia, unspecified: Secondary | ICD-10-CM

## 2013-03-05 DIAGNOSIS — S72001A Fracture of unspecified part of neck of right femur, initial encounter for closed fracture: Secondary | ICD-10-CM

## 2013-03-05 DIAGNOSIS — M549 Dorsalgia, unspecified: Secondary | ICD-10-CM

## 2013-03-05 LAB — BASIC METABOLIC PANEL
BUN: 15 mg/dL (ref 6–23)
CO2: 18 mEq/L — ABNORMAL LOW (ref 19–32)
Chloride: 104 mEq/L (ref 96–112)
GFR calc Af Amer: >90 mL/min (ref >90)
Potassium: 3.4 mEq/L — ABNORMAL LOW (ref 3.5–5.1)

## 2013-03-05 LAB — CBC WITH DIFFERENTIAL/PLATELET
Basophils Relative: 0 % (ref 0–1)
Eosinophils Relative: 1 % (ref 0–5)
HCT: 33 % — ABNORMAL LOW (ref 36.0–46.0)
Hemoglobin: 11.5 g/dL — ABNORMAL LOW (ref 12.0–15.0)
Lymphocytes Relative: 13 % (ref 12–46)
MCHC: 34.8 g/dL (ref 30.0–36.0)
MCV: 82.9 fL (ref 78.0–100.0)
Monocytes Absolute: 0.6 10*3/uL (ref 0.1–1.0)
Monocytes Relative: 6 % (ref 3–12)
Neutro Abs: 8.3 10*3/uL — ABNORMAL HIGH (ref 1.7–7.7)

## 2013-03-05 LAB — URINALYSIS, ROUTINE W REFLEX MICROSCOPIC
Glucose, UA: NEGATIVE mg/dL
Leukocytes, UA: NEGATIVE
Protein, ur: NEGATIVE mg/dL
Urobilinogen, UA: 0.2 mg/dL (ref 0.0–1.0)

## 2013-03-05 LAB — URINE MICROSCOPIC-ADD ON

## 2013-03-05 LAB — HEPATIC FUNCTION PANEL
ALT: 8 U/L (ref 0–35)
AST: 16 U/L (ref 0–37)
Bilirubin, Direct: <0.1 mg/dL (ref 0.0–0.3)
Total Bilirubin: 0.2 mg/dL — ABNORMAL LOW (ref 0.3–1.2)

## 2013-03-05 MED ORDER — FENTANYL 25 MCG/HR TD PT72
25.0000 ug | MEDICATED_PATCH | TRANSDERMAL | Status: DC
  Administered 2013-03-05 – 2013-03-08 (×2): 25 ug via TRANSDERMAL
  Filled 2013-03-05 (×2): qty 1

## 2013-03-05 MED ORDER — SODIUM CHLORIDE 0.9 % IV SOLN: INTRAVENOUS | Status: AC

## 2013-03-05 MED ORDER — HYDROMORPHONE HCL PF 1 MG/ML IJ SOLN
1.0000 mg | Freq: Once | INTRAMUSCULAR | Status: AC
  Administered 2013-03-05: 1 mg via INTRAVENOUS
  Filled 2013-03-05: qty 1

## 2013-03-05 MED ORDER — TRAMADOL HCL 50 MG PO TABS
50.0000 mg | ORAL_TABLET | Freq: Four times a day (QID) | ORAL | Status: DC | PRN
  Administered 2013-03-06 (×2): 50 mg via ORAL
  Filled 2013-03-05 (×3): qty 1

## 2013-03-05 MED ORDER — DIAZEPAM 5 MG PO TABS
10.0000 mg | ORAL_TABLET | Freq: Three times a day (TID) | ORAL | Status: DC | PRN
  Administered 2013-03-05 – 2013-03-10 (×5): 10 mg via ORAL
  Filled 2013-03-05 (×5): qty 2

## 2013-03-05 MED ORDER — SODIUM CHLORIDE 0.9 % IV SOLN
Freq: Once | INTRAVENOUS | Status: AC
  Administered 2013-03-05: via INTRAVENOUS

## 2013-03-05 MED ORDER — POTASSIUM CHLORIDE CRYS ER 20 MEQ PO TBCR
20.0000 meq | EXTENDED_RELEASE_TABLET | Freq: Every day | ORAL | Status: DC
  Administered 2013-03-05 – 2013-03-10 (×5): 20 meq via ORAL
  Filled 2013-03-05 (×6): qty 1

## 2013-03-05 MED ORDER — HYDROMORPHONE HCL PF 1 MG/ML IJ SOLN
0.5000 mg | INTRAMUSCULAR | Status: DC | PRN
  Administered 2013-03-05 – 2013-03-10 (×26): 0.5 mg via INTRAVENOUS
  Filled 2013-03-05 (×26): qty 1

## 2013-03-05 MED ORDER — POTASSIUM CHLORIDE IN NACL 20-0.9 MEQ/L-% IV SOLN
INTRAVENOUS | Status: DC
  Administered 2013-03-05 – 2013-03-06 (×3): via INTRAVENOUS

## 2013-03-05 MED ORDER — DOCUSATE SODIUM 100 MG PO CAPS
100.0000 mg | ORAL_CAPSULE | Freq: Two times a day (BID) | ORAL | Status: DC
  Administered 2013-03-05 – 2013-03-08 (×7): 100 mg via ORAL
  Filled 2013-03-05 (×10): qty 1

## 2013-03-05 MED ORDER — ONDANSETRON HCL 4 MG/2ML IJ SOLN
4.0000 mg | Freq: Once | INTRAMUSCULAR | Status: AC
  Administered 2013-03-05: 4 mg via INTRAVENOUS
  Filled 2013-03-05: qty 2

## 2013-03-05 MED ORDER — ENOXAPARIN SODIUM 40 MG/0.4ML ~~LOC~~ SOLN
40.0000 mg | Freq: Once | SUBCUTANEOUS | Status: AC
  Administered 2013-03-05: 40 mg via SUBCUTANEOUS
  Filled 2013-03-05: qty 0.4

## 2013-03-05 NOTE — H&P (Signed)
[facility] History and Physical  Ellen Anderson ZOX:[id number] DOB: [birth year] DOA: 03/04/2013   PCP: Alice Reichert, MD  Specialists: Dr. Romeo Apple is her orthopedic doctor.  Chief Complaint: Larey Seat on right hip  HPI: Ellen Anderson is a 66 y.o. female with a past medical history of chronic back pain, anemia, who was in her usual state of health till earlier tonight, when she was sitting on the edge of her coffee table and trying to fix her cable TV. Suddenly she flipped and fell on her right side. She did not pass out. There was no head injury. No heavy objects fell on her. Subsequently, she noted right hip pain. She was brought to the hospital for further evaluation. Denies any chest pain or shortness of breath. Denies any history of heart disease. No history of hypertension, and diabetes. She does have chronic back pain, and efforts are ongoing to refer her to a pain clinic.  Home Medications: Prior to Admission medications   Medication Sig Start Date End Date Taking? Authorizing Provider  diazepam (VALIUM) 10 MG tablet Take 1 tablet (10 mg total) by mouth every 8 (eight) hours as needed. For muscle spasms 03/16/12  Yes Vickki Hearing, MD  enoxaparin (LOVENOX) 30 MG/0.3ML injection Inject 30 mg into the skin daily. 03/16/12   Vickki Hearing, MD  fentaNYL (DURAGESIC - DOSED MCG/HR) 25 MCG/HR Place 1 patch onto the skin every 3 (three) days.    Historical Provider, MD  meperidine (DEMEROL) 50 MG tablet Take 1 tablet (50 mg total) by mouth every 4 (four) hours as needed for pain. 10/05/12   Vickki Hearing, MD  potassium chloride SA (K-DUR,KLOR-CON) 20 MEQ tablet Take 1 tablet (20 mEq total) by mouth 3 (three) times daily. 03/16/12 03/16/13  Vickki Hearing, MD  promethazine (PHENERGAN) 25 MG tablet Take 1 tablet (25 mg total) by mouth every 6 (six) hours as needed. Takes with demerol for nausea 10/05/12   Vickki Hearing, MD  senna (SENOKOT) 8.6 MG TABS Take 1 tablet (8.6 mg  total) by mouth 2 (two) times daily. 03/16/12   Vickki Hearing, MD  traMADol (ULTRAM) 50 MG tablet Take 1 tablet (50 mg total) by mouth every 4 (four) hours as needed. 10/03/12   Vickki Hearing, MD    Allergies:  Allergies  Allergen Reactions  . Amoxicillin-Pot Clavulanate Swelling    Of throat. "States she can take if given with prednisone and Benadryl"  . Ceclor (Cefaclor) Swelling    Of throat. States "I can take with prednisone and Benadryl"  . Morphine And Related Other (See Comments)    Causes heart to stop. Incidence [facility] at [facility] about 6 years ago.  . Sulfur Shortness Of Breath    Tongue swelling  . Azithromycin Other (See Comments)    States "All '-mycins' break my mouth out in blisters". Can take penicillin  . Ivp Dye (Iodinated Diagnostic Agents) Other (See Comments)    convulsions  . Methadone Other (See Comments)    hallucinations  . Percocet (Oxycodone-Acetaminophen) Itching    Scratches til she bleeds  . Codeine Hives and Rash  . Escitalopram Oxalate Other (See Comments)    hallucinations  . Ibuprofen Hives and Rash    Severe rash per patient  . Tylenol (Acetaminophen) Hives and Rash    Severe itching    Past Medical History: Past Medical History  Diagnosis Date  . Degenerative joint disease   . Chronic low back pain   .  Hypertension   . Anemia   . Hip fracture, left 03/12/2012    Past Surgical History  Procedure Laterality Date  . Back surgery    . Ankle surgery    . Abdominal hysterectomy    . Cholecystectomy    . Appendectomy    . Hip surgery      Social History:  reports that she has never smoked. She has never used smokeless tobacco. She reports that she does not drink alcohol or use illicit drugs.  Living Situation: She lives by herself Activity Level: Independent with daily activities   Family History:  Family History  Problem Relation Age of Onset  . Throat cancer Mother   . COPD Father      Review of Systems - History  obtained from the patient General ROS: negative Psychological ROS: positive for - anxiety Ophthalmic ROS: negative ENT ROS: negative Allergy and Immunology ROS: negative Hematological and Lymphatic ROS: negative Endocrine ROS: negative Respiratory ROS: no cough, shortness of breath, or wheezing Cardiovascular ROS: no chest pain or dyspnea on exertion Gastrointestinal ROS: no abdominal pain, change in bowel habits, or black or bloody stools Genito-Urinary ROS: no dysuria, trouble voiding, or hematuria Musculoskeletal ROS: as in hpi Neurological ROS: no TIA or stroke symptoms Dermatological ROS: negative  Physical Examination  Filed Vitals:   03/04/13 2301  BP: 102/62  Pulse: 63  Temp: 97.8 F (36.6 C)  TempSrc: Oral  Resp: 20  Height: 5\' 2"  (1.575 m)  Weight: 72.576 kg (160 lb)  SpO2: 98%    General appearance: alert, cooperative, appears stated age and no distress Head: Normocephalic, without obvious abnormality, atraumatic Eyes: conjunctivae/corneas clear. PERRL, EOM's intact. Throat: lips, mucosa, and tongue normal; teeth and gums normal Neck: no adenopathy, no carotid bruit, no JVD, supple, symmetrical, trachea midline and thyroid not enlarged, symmetric, no tenderness/mass/nodules Resp: clear to auscultation bilaterally Cardio: regular rate and rhythm, S1, S2 normal, no murmur, click, rub or gallop GI: soft, non-tender; bowel sounds normal; no masses,  no organomegaly Extremities: Right lower extremity was externally rotated. There is no edema Pulses: 2+ and symmetric Skin: Skin color, texture, turgor normal. No rashes or lesions Lymph nodes: Cervical, supraclavicular, and axillary nodes normal. Neurologic: She is alert and oriented x3. No focal neurological deficits are present.  Laboratory Data: Results for orders placed during the hospital encounter of 03/04/13 (from the past 48 hour(s))  BASIC METABOLIC PANEL     Status: Abnormal   Collection Time    03/05/13  12:39 AM      Result Value Range   Sodium 135  135 - 145 mEq/L   Potassium 3.4 (*) 3.5 - 5.1 mEq/L   Chloride 104  96 - 112 mEq/L   CO2 18 (*) 19 - 32 mEq/L   Glucose, Bld 108 (*) 70 - 99 mg/dL   BUN 15  6 - 23 mg/dL   Creatinine, Ser 1.91  0.50 - 1.10 mg/dL   Calcium 9.3  8.4 - 47.8 mg/dL   GFR calc non Af Amer 84 (*) >90 mL/min   GFR calc Af Amer >90  >90 mL/min   Comment:            The eGFR has been calculated     using the CKD EPI equation.     This calculation has not been     validated in all clinical     situations.     eGFR's persistently     <90 mL/min signify  possible Chronic Kidney Disease.  CBC WITH DIFFERENTIAL     Status: Abnormal   Collection Time    03/05/13 12:39 AM      Result Value Range   WBC 10.5  4.0 - 10.5 K/uL   RBC 3.98  3.87 - 5.11 MIL/uL   Hemoglobin 11.5 (*) 12.0 - 15.0 g/dL   HCT 69.6 (*) 29.5 - 28.4 %   MCV 82.9  78.0 - 100.0 fL   MCH 28.9  26.0 - 34.0 pg   MCHC 34.8  30.0 - 36.0 g/dL   RDW 13.2  44.0 - 10.2 %   Platelets 225  150 - 400 K/uL   Neutrophils Relative 80 (*) 43 - 77 %   Neutro Abs 8.3 (*) 1.7 - 7.7 K/uL   Lymphocytes Relative 13  12 - 46 %   Lymphs Abs 1.4  0.7 - 4.0 K/uL   Monocytes Relative 6  3 - 12 %   Monocytes Absolute 0.6  0.1 - 1.0 K/uL   Eosinophils Relative 1  0 - 5 %   Eosinophils Absolute 0.1  0.0 - 0.7 K/uL   Basophils Relative 0  0 - 1 %   Basophils Absolute 0.0  0.0 - 0.1 K/uL  PROTIME-INR     Status: None   Collection Time    03/05/13 12:39 AM      Result Value Range   Prothrombin Time 13.8  11.6 - 15.2 seconds   INR 1.07  0.00 - 1.49  APTT     Status: None   Collection Time    03/05/13 12:39 AM      Result Value Range   aPTT 28  24 - 37 seconds    Radiology Reports: Dg Hip Complete Right  03/05/2013  *RADIOLOGY REPORT*  Clinical Data:  Right hip pain, fall  RIGHT HIP - COMPLETE 2+ VIEW  Comparison: Pelvic radiograph 03/12/2012  Findings: Osseous demineralization. Mild narrowing of bilateral hip  joints slightly greater on the right. Prior vertebroplasties of L3, L4, and L5. Orthopedic hardware proximal left femur. Proximal right femur has a slightly different configuration at the junction of the head and neck versus previous exam suspicious for subtle fracture, though poorly delineated due to degree of demineralization. No dislocation seen. Visualized pelvis appears intact.  IMPRESSION: Suspicion of a basicervical right femoral fracture though this is not well delineated due to degree of osseous demineralization. Recommend noncontrast CT of the right hip to evaluate.   Original Report Authenticated By: Ulyses Southward, M.D.    Ct Hip Right Wo Contrast  03/05/2013  *RADIOLOGY REPORT*  Clinical Data: Right hip pain post fall, abnormal radiographs  CT OF THE RIGHT HIP WITHOUT CONTRAST  Technique:  Multidetector CT imaging was performed according to the standard protocol. Multiplanar CT image reconstructions were also generated.  Comparison: Right hip radiographs 03/04/2013  Findings: Intertrochanteric fracture right femur, minimally displaced. Mild varus angulation. Narrowing of right hip joint without dislocation. Pelvis appears intact. Visualized SI joint normal appearance. Intrapelvic structures unremarkable. No significant soft tissue abnormalities.  IMPRESSION: Mildly displaced and angulated intertrochanteric fracture right femur. Severe osseous demineralization.   Original Report Authenticated By: Ulyses Southward, M.D.     Electrocardiogram: Pending  Problem List  Principal Problem:   Hip fracture, right Active Problems:   Anemia   Back pain, chronic   Hypokalemia   Assessment: This is a 67 year old, Caucasian female, who does not have any significant medical problems, who fell, and has sustained a hip fracture on  the right side. Patient does not want to go to [facility]. There is no orthopedic coverage in [place] this weekend. She wants to wait till Monday to be evaluated by Dr.  Romeo Apple.  Plan: #1 right hip fracture: Dr. Colon Branch has discussed this with [facility] orthopedics and they feel that it may be reasonable for the patient to wait here till she is evaluated by her orthopedic surgeon on Monday. They recommend one dose of Lovenox and sequential compressive devices. Pain control will be provided. EKG, and chest x-ray will be obtained. Dr. Renard Matter will assume care in the morning and he can followup on the EKG and chest x-ray to see if the patient may proceed to surgery.  #2 history of chronic back pain: Continue with fentanyl patch. Use parenteral agents for breakthrough pain.  #3 anemia: Appears to be chronic. Stable at this time.  #4 hypokalemia: This will be repleted.  Her bicarbonate is mildly low on the metabolic panel. This will be repeated in the morning.  DVT Prophylaxis: She'll be given one dose of enoxaparin and sequential compressive devices ill be applied. Definitive DVT prophylaxis to be initiated postoperatively. Code Status: She is a full code Family Communication: Discussed with the patient and her son  Disposition Plan: Will likely require short-term rehabilitation.   Further management decisions will depend on results of further testing and patient's response to treatment.  [facility]  [facility] Pager [phone]  If 7PM-7AM, please contact night-coverage [email or web address] Password [email or web address]  03/05/2013, 2:17 AM

## 2013-03-05 NOTE — ED Notes (Signed)
Dr. Krishnan in with pt 

## 2013-03-05 NOTE — ED Provider Notes (Addendum)
History     CSN: [id number]  Arrival date & time 03/04/13  2257   First MD Initiated Contact with Patient 03/04/13 2359      Chief Complaint  Patient presents with  . Hip Pain  . Fall    (Consider location/radiation/quality/duration/timing/severity/associated sxs/prior treatment) HPI Ellen Anderson is a 67 y.o. female with a h/o chronic back pain, HTN, anemia, DJD  brought in by ambulance, who presents to the Emergency Department complaining of right hip pain after sitting on the edge of a coffee table and having it break with her ending up on the floor.   PCP Dr. Renard Matter  Past Medical History  Diagnosis Date  . Degenerative joint disease   . Chronic low back pain   . Hypertension   . Anemia   . Hip fracture, left 03/12/2012    Past Surgical History  Procedure Laterality Date  . Back surgery    . Ankle surgery    . Abdominal hysterectomy    . Cholecystectomy    . Appendectomy    . Hip surgery      Family History  Problem Relation Age of Onset  . Throat cancer Mother   . COPD Father     History  Substance Use Topics  . Smoking status: Never Smoker   . Smokeless tobacco: Never Used  . Alcohol Use: No    OB History   Grav Para Term Preterm Abortions TAB SAB Ect Mult Living                  Review of Systems  Constitutional: Negative for fever.       10 Systems reviewed and are negative for acute change except as noted in the HPI.  HENT: Negative for congestion.   Eyes: Negative for discharge and redness.  Respiratory: Negative for cough and shortness of breath.   Cardiovascular: Negative for chest pain.  Gastrointestinal: Negative for vomiting and abdominal pain.  Musculoskeletal: Negative for back pain.       Right hip pain  Skin: Negative for rash.  Neurological: Negative for syncope, numbness and headaches.  Psychiatric/Behavioral:       No behavior change.    Allergies  Amoxicillin-pot clavulanate; Ceclor; Morphine and related; Sulfur;  Azithromycin; Ivp dye; Methadone; Percocet; Codeine; Escitalopram oxalate; Ibuprofen; and Tylenol  Home Medications   Current Outpatient Rx  Name  Route  Sig  Dispense  Refill  . diazepam (VALIUM) 10 MG tablet   Oral   Take 1 tablet (10 mg total) by mouth every 8 (eight) hours as needed. For muscle spasms   30 tablet   0   . enoxaparin (LOVENOX) 30 MG/0.3ML injection   Subcutaneous   Inject 30 mg into the skin daily.         . fentaNYL (DURAGESIC - DOSED MCG/HR) 25 MCG/HR   Transdermal   Place 1 patch onto the skin every 3 (three) days.         . meperidine (DEMEROL) 50 MG tablet   Oral   Take 1 tablet (50 mg total) by mouth every 4 (four) hours as needed for pain.   90 tablet   0   . potassium chloride SA (K-DUR,KLOR-CON) 20 MEQ tablet   Oral   Take 1 tablet (20 mEq total) by mouth 3 (three) times daily.         . promethazine (PHENERGAN) 25 MG tablet   Oral   Take 1 tablet (25 mg total) by mouth every  6 (six) hours as needed. Takes with demerol for nausea   90 tablet   0   . senna (SENOKOT) 8.6 MG TABS   Oral   Take 1 tablet (8.6 mg total) by mouth 2 (two) times daily.   120 each      . traMADol (ULTRAM) 50 MG tablet   Oral   Take 1 tablet (50 mg total) by mouth every 4 (four) hours as needed.   90 tablet   3     BP 102/62  Pulse 63  Temp(Src) 97.8 F (36.6 C) (Oral)  Resp 20  Ht 5\' 2"  (1.575 m)  Wt 160 lb (72.576 kg)  BMI 29.26 kg/m2  SpO2 98%  Physical Exam  Nursing note and vitals reviewed. Constitutional: She appears well-developed and well-nourished.  Awake, alert, nontoxic appearance.  HENT:  Head: Normocephalic and atraumatic.  Eyes: EOM are normal. Pupils are equal, round, and reactive to light. Right eye exhibits no discharge. Left eye exhibits no discharge.  Neck: Neck supple.  Cardiovascular: Normal rate and intact distal pulses.   Pulmonary/Chest: Effort normal and breath sounds normal. She exhibits no tenderness.  Abdominal:  Soft. Bowel sounds are normal. There is no tenderness. There is no rebound.  Musculoskeletal: She exhibits no tenderness.  Baseline ROM, no obvious new focal weakness.Pain with movement of the right hip  Neurological:  Mental status and motor strength appears baseline for patient and situation.  Skin: No rash noted.  Psychiatric: She has a normal mood and affect.    ED Course  Procedures (including critical care time)  Dg Hip Complete Right  03/05/2013  *RADIOLOGY REPORT*  Clinical Data:  Right hip pain, fall  RIGHT HIP - COMPLETE 2+ VIEW  Comparison: Pelvic radiograph 03/12/2012  Findings: Osseous demineralization. Mild narrowing of bilateral hip joints slightly greater on the right. Prior vertebroplasties of L3, L4, and L5. Orthopedic hardware proximal left femur. Proximal right femur has a slightly different configuration at the junction of the head and neck versus previous exam suspicious for subtle fracture, though poorly delineated due to degree of demineralization. No dislocation seen. Visualized pelvis appears intact.  IMPRESSION: Suspicion of a basicervical right femoral fracture though this is not well delineated due to degree of osseous demineralization. Recommend noncontrast CT of the right hip to evaluate.   Original Report Authenticated By: Ulyses Southward, M.D.    Ct Hip Right Wo Contrast  03/05/2013  *RADIOLOGY REPORT*  Clinical Data: Right hip pain post fall, abnormal radiographs  CT OF THE RIGHT HIP WITHOUT CONTRAST  Technique:  Multidetector CT imaging was performed according to the standard protocol. Multiplanar CT image reconstructions were also generated.  Comparison: Right hip radiographs 03/04/2013  Findings: Intertrochanteric fracture right femur, minimally displaced. Mild varus angulation. Narrowing of right hip joint without dislocation. Pelvis appears intact. Visualized SI joint normal appearance. Intrapelvic structures unremarkable. No significant soft tissue abnormalities.   IMPRESSION: Mildly displaced and angulated intertrochanteric fracture right femur. Severe osseous demineralization.   Original Report Authenticated By: Ulyses Southward, M.D.    Date: 03/05/2013  0012  Rate: 122  Rhythm: sinus tachycardia  QRS Axis: normal  Intervals: normal  ST/T Wave abnormalities: normal  Conduction Disutrbances:none  Narrative Interpretation:   Old EKG Reviewed: unchanged c/w 12/01/2012  1:52 AM:  T/C to Dr. Rito Ehrlich, hospitalist, case discussed, including:  HPI, pertinent PM/SHx, VS/PE, dx testing, ED course and treatment.  He will agree to come done and see the patient and start the paperwork. He asked that  I speak with ortho in [place] to see if the patient would be all right clinically to wait 36 hours for repair.  2:02 AM:  T/C to Dr. Luiz Blare, orthopedist, case discussed, including:  HPI, pertinent PM/SHx, VS/PE, dx testing, ED course and treatment.  Agreeable to the patient remaining at [facility].  States that pain management can be handled with IV meds. Anticoagulation is the issue of concern. He is in favor of a single dose of lovenox with stopping lovenox on the day of surgery. In the meantime, use of  SEDs is advised. MDM  Patient with right intertroch fracture with mild varus angulation. Reviewed results with patient. She does not want to go to [facility]. Would prefer to stay here and have Dr. Romeo Apple repair the hip on Monday. Spoke with both Dr. Rito Ehrlich, hospitalist and Dr. Luiz Blare, orthopedist. Dr. Rito Ehrlich will admit the patient. Pt stable in ED with no significant deterioration in condition.The patient appears reasonably stabilized for admission considering the current resources, flow, and capabilities available in the ED at this time, and I doubt any other [facility] requiring further screening and/or treatment in the ED prior to admission.   Date: 03/05/2013  1610  Rate: 68  Rhythm: normal sinus rhythm  QRS Axis: normal  Intervals: normal  ST/T Wave abnormalities: nonspecific ST  changes  Conduction Disutrbances:none  Narrative Interpretation:   Old EKG Reviewed: unchanged c/w 03/12/2012  MDM Reviewed: nursing note and vitals Interpretation: labs, ECG, x-ray and CT scan           Nicoletta Dress. Colon Branch, MD 03/05/13 9604  Nicoletta Dress. Colon Branch, MD 03/05/13 [phone]

## 2013-03-06 DIAGNOSIS — S72143A Displaced intertrochanteric fracture of unspecified femur, initial encounter for closed fracture: Principal | ICD-10-CM

## 2013-03-06 LAB — BASIC METABOLIC PANEL
BUN: 8 mg/dL (ref 6–23)
CO2: 26 mEq/L (ref 19–32)
Calcium: 8.7 mg/dL (ref 8.4–10.5)
Glucose, Bld: 109 mg/dL — ABNORMAL HIGH (ref 70–99)
Sodium: 137 mEq/L (ref 135–145)

## 2013-03-06 LAB — CBC
HCT: 30.9 % — ABNORMAL LOW (ref 36.0–46.0)
Hemoglobin: 10.2 g/dL — ABNORMAL LOW (ref 12.0–15.0)
MCH: 28.6 pg (ref 26.0–34.0)
MCHC: 33 g/dL (ref 30.0–36.0)
RBC: 3.57 MIL/uL — ABNORMAL LOW (ref 3.87–5.11)

## 2013-03-06 LAB — SURGICAL PCR SCREEN: Staphylococcus aureus: POSITIVE — AB

## 2013-03-06 MED ORDER — MUPIROCIN 2 % EX OINT
1.0000 "application " | TOPICAL_OINTMENT | Freq: Two times a day (BID) | CUTANEOUS | Status: DC
  Administered 2013-03-06 – 2013-03-08 (×4): 1 via NASAL
  Filled 2013-03-06 (×3): qty 22

## 2013-03-06 MED ORDER — CHLORHEXIDINE GLUCONATE 4 % EX LIQD
60.0000 mL | Freq: Once | CUTANEOUS | Status: AC
  Administered 2013-03-08: 4 via TOPICAL
  Filled 2013-03-06: qty 15

## 2013-03-06 MED ORDER — CHLORHEXIDINE GLUCONATE CLOTH 2 % EX PADS
6.0000 | MEDICATED_PAD | Freq: Every day | CUTANEOUS | Status: DC
  Administered 2013-03-07 – 2013-03-08 (×2): 6 via TOPICAL

## 2013-03-06 MED ORDER — VANCOMYCIN HCL IN DEXTROSE 1-5 GM/200ML-% IV SOLN
1000.0000 mg | INTRAVENOUS | Status: DC
  Filled 2013-03-06: qty 200

## 2013-03-06 MED ORDER — DIPHENHYDRAMINE HCL 25 MG PO CAPS
25.0000 mg | ORAL_CAPSULE | Freq: Four times a day (QID) | ORAL | Status: DC | PRN
  Administered 2013-03-06 – 2013-03-08 (×2): 25 mg via ORAL
  Filled 2013-03-06 (×2): qty 1

## 2013-03-06 MED ORDER — ENOXAPARIN SODIUM 30 MG/0.3ML ~~LOC~~ SOLN
30.0000 mg | Freq: Once | SUBCUTANEOUS | Status: AC
  Administered 2013-03-06: 30 mg via SUBCUTANEOUS
  Filled 2013-03-06: qty 0.3

## 2013-03-06 MED ORDER — CHLORHEXIDINE GLUCONATE 4 % EX LIQD: 60.0000 mL | Freq: Once | CUTANEOUS | Status: DC

## 2013-03-06 MED ORDER — ACETAMINOPHEN 325 MG PO TABS
650.0000 mg | ORAL_TABLET | Freq: Four times a day (QID) | ORAL | Status: DC | PRN
  Administered 2013-03-06 – 2013-03-07 (×2): 650 mg via ORAL
  Filled 2013-03-06 (×2): qty 2

## 2013-03-06 NOTE — Care Management Note (Signed)
    Page 1 of 1   03/10/2013     1:19:09 PM   CARE MANAGEMENT NOTE 03/10/2013  Patient:  Ellen Anderson, Ellen Anderson   Account Number:  [id number]  Date Initiated:  03/06/2013  Documentation initiated by:  Sharrie Rothman  Subjective/Objective Assessment:   Pt admitted from home with hip fracture. Pt lives alone and has a son that checks on pt frequently. Pt has a w/c, cane, walker for home use. Pt is aware that she may need SNF at discharge.     Action/Plan:   Pt requiring surgery before discharge. Pt has been to [facility] in the past with a previous hip fracture. CSW will arrange discharge to facility. If pt does go home, CM will arrange HH.   Anticipated DC Date:  03/10/2013   Anticipated DC Plan:  SKILLED NURSING FACILITY  In-house referral  Clinical Social Worker      DC Planning Services  CM consult      Choice offered to / List presented to:             Status of service:  Completed, signed off Medicare Important Message given?  YES (If response is "NO", the following Medicare IM given date fields will be blank) Date Medicare IM given:  03/10/2013 Date Additional Medicare IM given:    Discharge Disposition:  SKILLED NURSING FACILITY  Per UR Regulation:    If discussed at Long Length of Stay Meetings, dates discussed:   03/09/2013    Comments:  03/10/13 1320 Arlyss Queen, RN BSN CM Pt discharged to [facility] today. CSW to arrange discharge to facility.  03/06/13 1530 Arlyss Queen, RN BSN CM

## 2013-03-06 NOTE — Clinical Social Work Psychosocial (Signed)
Clinical Social Work Department BRIEF PSYCHOSOCIAL ASSESSMENT 03/06/2013  Patient:  Ellen Anderson, Ellen Anderson     Account Number:  [id number]     Admit date:  03/04/2013  Clinical Social Worker:  Nancie Neas  Date/Time:  03/06/2013 11:10 AM  Referred by:  Physician  Date Referred:  03/06/2013 Referred for  SNF Placement   Other Referral:   Interview type:  Patient Other interview type:    PSYCHOSOCIAL DATA Living Status:  ALONE Admitted from facility:   Level of care:   Primary support name:  Loraine Leriche Primary support relationship to patient:  CHILD, ADULT Degree of support available:   supportive    CURRENT CONCERNS Current Concerns  Other - See comment   Other Concerns:   hip fracture    SOCIAL WORK ASSESSMENT / PLAN CSW met with pt at bedside following referral from MD. Pt alert and oriented and reports she was at home Saturday evening and having issues with her television. She was sitting on the edge of the coffee table and slipped off and said the table fell on her. Pt has fractured hip. Pt alert and oriented and states she lives alone. She fractured her other hip last spring and went to [facility] for rehab for about 4 weeks. She was completely independent PTA. Pt's son Loraine Leriche is described as her best support. She would like to see how she does with PT after surgery before deciding disposition. Pt's son and his girlfriend are willing to stay with pt if she returns home but she is also open to considering SNF if needed. Pt has one story house that is handicap accessible. She would choose to return to [facility]. CSW will follow up after PT evaluation to determine appropriate d/c plan.   Assessment/plan status:  Psychosocial Support/Ongoing Assessment of Needs Other assessment/ plan:   Information/referral to community resources:   SNF list    PATIENT'S/FAMILY'S RESPONSE TO PLAN OF CARE: Pt pleasant and cooperative. Awaiting ortho consult. CSW to continue to follow.        Derenda Fennel, [place] [phone]

## 2013-03-06 NOTE — Progress Notes (Signed)
Subjective: The patient is fairly comfortable this morning. She does have mildly displaced intratrochanteric fracture of right hip. She is awaiting consultation from orthopedist today.  Objective: Vital signs in last 24 hours: Temp:  [97.6 F (36.4 C)-99.7 F (37.6 C)] 99.2 F (37.3 C) (03/31 0223) Pulse Rate:  [76-88] 81 (03/31 0223) Resp:  [20] 20 (03/31 0355) BP: (91-100)/(59-66) 100/66 mmHg (03/31 0223) SpO2:  [92 %-98 %] 95 % (03/31 0355) Weight change:  Last BM Date: 03/05/13  Intake/Output from previous day: 03/30 0701 - 03/31 0700 In: 1310.8 [P.O.:360; I.V.:950.8] Out: 650 [Urine:650] Intake/Output this shift: Total I/O In: 200 [I.V.:200] Out: 650 [Urine:650]  Physical Exam: Vital signs blood pressure 100/66 respiration 20 pulse 81 temp 98.2  HEENT negative  Lungs clear to P&A  Heart regular rhythm no murmurs  Abdomen no palpable organs or masses   Extremities tenderness of right hip no edema   Recent Labs  03/05/13 0039 03/06/13 0513  WBC 10.5 7.8  HGB 11.5* 10.2*  HCT 33.0* 30.9*  PLT 225 167   BMET  Recent Labs  03/05/13 0039  NA 135  K 3.4*  CL 104  CO2 18*  GLUCOSE 108*  BUN 15  CREATININE 0.79  CALCIUM 9.3    Studies/Results: Dg Hip Complete Right  03/05/2013  *RADIOLOGY REPORT*  Clinical Data:  Right hip pain, fall  RIGHT HIP - COMPLETE 2+ VIEW  Comparison: Pelvic radiograph 03/12/2012  Findings: Osseous demineralization. Mild narrowing of bilateral hip joints slightly greater on the right. Prior vertebroplasties of L3, L4, and L5. Orthopedic hardware proximal left femur. Proximal right femur has a slightly different configuration at the junction of the head and neck versus previous exam suspicious for subtle fracture, though poorly delineated due to degree of demineralization. No dislocation seen. Visualized pelvis appears intact.  IMPRESSION: Suspicion of a basicervical right femoral fracture though this is not well delineated due to  degree of osseous demineralization. Recommend noncontrast CT of the right hip to evaluate.   Original Report Authenticated By: Ulyses Southward, M.D.    Ct Hip Right Wo Contrast  03/05/2013  *RADIOLOGY REPORT*  Clinical Data: Right hip pain post fall, abnormal radiographs  CT OF THE RIGHT HIP WITHOUT CONTRAST  Technique:  Multidetector CT imaging was performed according to the standard protocol. Multiplanar CT image reconstructions were also generated.  Comparison: Right hip radiographs 03/04/2013  Findings: Intertrochanteric fracture right femur, minimally displaced. Mild varus angulation. Narrowing of right hip joint without dislocation. Pelvis appears intact. Visualized SI joint normal appearance. Intrapelvic structures unremarkable. No significant soft tissue abnormalities.  IMPRESSION: Mildly displaced and angulated intertrochanteric fracture right femur. Severe osseous demineralization.   Original Report Authenticated By: Ulyses Southward, M.D.    Dg Chest Port 1 View  03/05/2013  *RADIOLOGY REPORT*  Clinical Data: Preoperative evaluation.  PORTABLE CHEST - 1 VIEW  Comparison: Chest x-ray 03/15/2012.  Findings: Lung volumes are normal.  No consolidative airspace disease.  No pleural effusions.  No pneumothorax.  No pulmonary nodule or mass noted.  Pulmonary vasculature and the cardiomediastinal silhouette are within normal limits.  Surgical clips in the left upper quadrant of the abdomen.  IMPRESSION: 1. No radiographic evidence of acute cardiopulmonary disease.   Original Report Authenticated By: Trudie Reed, M.D.     Medications:  . docusate sodium  100 mg Oral BID  . fentaNYL  25 mcg Transdermal Q72H  . potassium chloride SA  20 mEq Oral Daily    . 0.9 % NaCl with KCl  20 mEq / L 50 mL/hr at 03/05/13 2138     Assessment/Plan: 1. The patient has intertrochanteric fracture right hip which is mildly displaced.  2. The patient has slightly low serum potassium level at 3.4 and slightly low hemoglobin  at 10.2  Plan to proceed with orthopedic consult today patient will in all likelihood have surgery. We will continue by mouth potassium and will continue to monitor this.   LOS: 2 days   Eboni Coval G 03/06/2013, 6:01 AM
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
## 2013-03-07 LAB — HEMOGLOBIN AND HEMATOCRIT, BLOOD
HCT: 33.8 % — ABNORMAL LOW (ref 36.0–46.0)
Hemoglobin: 11.4 g/dL — ABNORMAL LOW (ref 12.0–15.0)

## 2013-03-07 MED ORDER — VANCOMYCIN HCL IN DEXTROSE 1-5 GM/200ML-% IV SOLN
1000.0000 mg | INTRAVENOUS | Status: DC
  Filled 2013-03-07: qty 200

## 2013-03-07 NOTE — Progress Notes (Signed)
Subjective: preop fo right hip fracture surgical repair  Transfused for hg 10.2 down from 11.5   Objective: Vital signs in last 24 hours:BP 100/57  Pulse 75  Temp(Src) 98 F (36.7 C) (Oral)  Resp 20  Ht 5\' 2"  (1.575 m)  Wt 164 lb 3.9 oz (74.5 kg)  BMI 30.03 kg/m2  SpO2 94% Intake/Output from previous day: 03/31 0701 - 04/01 0700 In: 1349.2 [P.O.:360; I.V.:639.2; Blood:350] Out: 3800 [Urine:3800] Intake/Output this shift:     Recent Labs  03/05/13 0039 03/06/13 0513 03/07/13 0123  HGB 11.5* 10.2* 11.4*   Recent Labs  03/05/13 0039 03/06/13 0513  NA 135 137  K 3.4* 4.2  CL 104 105  CO2 18* 26  BUN 15 8  CREATININE 0.79 0.70  GLUCOSE 108* 109*  CALCIUM 9.3 8.7    Recent Labs  03/05/13 0039  INR 1.07    Assessment/Plan: Hg now 11 should be ok for gamma nail tomorrow 12 noon    Ellen Anderson 03/07/2013, 7:14 AM

## 2013-03-07 NOTE — Progress Notes (Signed)
Subjective: The patient is fairly comfortable. She did have a restful night. She is seen by orthopedist yesterday who plans surgery on Wednesday. She does have mildly displaced intertrochanteric fracture right hip.  Objective: Vital signs in last 24 hours: Temp:  [98 F (36.7 C)-100.6 F (38.1 C)] 98 F (36.7 C) (04/01 0538) Pulse Rate:  [75-109] 75 (04/01 0538) Resp:  [20] 20 (04/01 0538) BP: (96-117)/(50-83) 100/57 mmHg (04/01 0538) SpO2:  [94 %-97 %] 94 % (04/01 0538) Weight change:  Last BM Date: 03/05/13  Intake/Output from previous day: 03/31 0701 - 04/01 0700 In: 1349.2 [P.O.:360; I.V.:639.2; Blood:350] Out: 600 [Urine:600] Intake/Output this shift: Total I/O In: 550 [I.V.:200; Blood:350] Out: -   Physical Exam: Vital signs blood pressure 100/57 respiration 20 pulse 75 temp 98  HEENT negative  Lungs clear to P&A  Heart regular rhythm no murmurs  Abdomen no palpable organs or masses  Extremities tenderness over the right hip no edema   Recent Labs  03/05/13 0039 03/06/13 0513 03/07/13 0123  WBC 10.5 7.8  --   HGB 11.5* 10.2* 11.4*  HCT 33.0* 30.9* 33.8*  PLT 225 167  --    BMET  Recent Labs  03/05/13 0039 03/06/13 0513  NA 135 137  K 3.4* 4.2  CL 104 105  CO2 18* 26  GLUCOSE 108* 109*  BUN 15 8  CREATININE 0.79 0.70  CALCIUM 9.3 8.7    Studies/Results: No results found.  Medications:  . [START ON 03/08/2013] chlorhexidine  60 mL Topical Once  . Chlorhexidine Gluconate Cloth  6 each Topical Daily  . docusate sodium  100 mg Oral BID  . fentaNYL  25 mcg Transdermal Q72H  . mupirocin ointment  1 application Nasal BID  . potassium chloride SA  20 mEq Oral Daily  . [START ON 03/08/2013] vancomycin  1,000 mg Intravenous On Call to OR    . 0.9 % NaCl with KCl 20 mEq / L 50 mL/hr at 03/06/13 1722     Assessment/Plan: 1. The patient has intertrochanteric fracture right hip which is mildly displaced to the patient's serum potassium level is now  normal. She has been scheduled for surgery tomorrow by orthopedists   LOS: 3 days   Smitty Ackerley G 03/07/2013, 6:14 AM

## 2013-03-07 NOTE — Clinical Social Work Note (Signed)
SNF bed offers from [place], [place], [place], [place].   Santa [place], LCSW Clinical Social Worker [phone])

## 2013-03-07 NOTE — Progress Notes (Signed)
UR Chart Review Completed  

## 2013-03-08 ENCOUNTER — Encounter (HOSPITAL_COMMUNITY): Payer: Self-pay | Admitting: Anesthesiology

## 2013-03-08 ENCOUNTER — Encounter (HOSPITAL_COMMUNITY)
Admission: EM | Disposition: A | Discharge status: Skilled Nursing Facility | Payer: Self-pay | Source: Home / Self Care | Attending: Family Medicine

## 2013-03-08 ENCOUNTER — Inpatient Hospital Stay (HOSPITAL_COMMUNITY): Payer: Medicare Other

## 2013-03-08 ENCOUNTER — Encounter (HOSPITAL_COMMUNITY): Payer: Self-pay | Admitting: *Deleted

## 2013-03-08 ENCOUNTER — Inpatient Hospital Stay (HOSPITAL_COMMUNITY): Payer: Medicare Other | Admitting: Anesthesiology

## 2013-03-08 HISTORY — PX: INTRAMEDULLARY (IM) NAIL INTERTROCHANTERIC: SHX5875

## 2013-03-08 LAB — CREATININE, SERUM
Creatinine, Ser: 0.51 mg/dL (ref 0.50–1.10)
GFR calc Af Amer: >90 mL/min (ref >90)
GFR calc non Af Amer: >90 mL/min (ref >90)

## 2013-03-08 LAB — CBC
Hemoglobin: 11 g/dL — ABNORMAL LOW (ref 12.0–15.0)
MCH: 29.3 pg (ref 26.0–34.0)
Platelets: 160 10*3/uL (ref 150–400)
RBC: 3.75 MIL/uL — ABNORMAL LOW (ref 3.87–5.11)
WBC: 8 10*3/uL (ref 4.0–10.5)

## 2013-03-08 SURGERY — FIXATION, FRACTURE, INTERTROCHANTERIC, WITH INTRAMEDULLARY ROD
Anesthesia: General | Site: Hip | Laterality: Right | Wound class: Clean

## 2013-03-08 MED ORDER — FENTANYL CITRATE 0.05 MG/ML IJ SOLN
INTRAMUSCULAR | Status: AC
  Filled 2013-03-08: qty 5

## 2013-03-08 MED ORDER — VANCOMYCIN HCL IN DEXTROSE 1-5 GM/200ML-% IV SOLN
1000.0000 mg | Freq: Two times a day (BID) | INTRAVENOUS | Status: AC
  Administered 2013-03-08: 1000 mg via INTRAVENOUS
  Filled 2013-03-08: qty 200

## 2013-03-08 MED ORDER — GLYCOPYRROLATE 0.2 MG/ML IJ SOLN
INTRAMUSCULAR | Status: AC
  Filled 2013-03-08: qty 1

## 2013-03-08 MED ORDER — SODIUM CHLORIDE 0.9 % IV SOLN
INTRAVENOUS | Status: DC
  Administered 2013-03-08 – 2013-03-09 (×2): via INTRAVENOUS

## 2013-03-08 MED ORDER — ACETAMINOPHEN 650 MG RE SUPP: 650.0000 mg | Freq: Four times a day (QID) | RECTAL | Status: DC | PRN

## 2013-03-08 MED ORDER — BUPIVACAINE IN DEXTROSE 0.75-8.25 % IT SOLN
INTRATHECAL | Status: AC
  Filled 2013-03-08: qty 2

## 2013-03-08 MED ORDER — BUPIVACAINE-EPINEPHRINE PF 0.5-1:200000 % IJ SOLN
INTRAMUSCULAR | Status: AC
  Filled 2013-03-08: qty 20

## 2013-03-08 MED ORDER — FENTANYL CITRATE 0.05 MG/ML IJ SOLN
25.0000 ug | INTRAMUSCULAR | Status: DC | PRN
  Administered 2013-03-08: 25 ug via INTRAVENOUS

## 2013-03-08 MED ORDER — PROPOFOL 10 MG/ML IV EMUL
INTRAVENOUS | Status: AC
  Filled 2013-03-08: qty 20

## 2013-03-08 MED ORDER — SODIUM CHLORIDE 0.9 % IR SOLN
Status: DC | PRN
  Administered 2013-03-08 (×2): 1000 mL

## 2013-03-08 MED ORDER — MENTHOL 3 MG MT LOZG: 1.0000 | LOZENGE | OROMUCOSAL | Status: DC | PRN

## 2013-03-08 MED ORDER — LACTATED RINGERS IV SOLN
INTRAVENOUS | Status: DC | PRN
  Administered 2013-03-08 (×2): via INTRAVENOUS

## 2013-03-08 MED ORDER — ONDANSETRON HCL 4 MG/2ML IJ SOLN: 4.0000 mg | Freq: Four times a day (QID) | INTRAMUSCULAR | Status: DC | PRN

## 2013-03-08 MED ORDER — PHENOL 1.4 % MT LIQD: 1.0000 | OROMUCOSAL | Status: DC | PRN

## 2013-03-08 MED ORDER — BISACODYL 10 MG RE SUPP: 10.0000 mg | Freq: Every day | RECTAL | Status: DC | PRN

## 2013-03-08 MED ORDER — LACTATED RINGERS IV SOLN
INTRAVENOUS | Status: DC
  Administered 2013-03-08: 12:00:00 via INTRAVENOUS

## 2013-03-08 MED ORDER — SENNOSIDES-DOCUSATE SODIUM 8.6-50 MG PO TABS: 1.0000 | ORAL_TABLET | Freq: Every evening | ORAL | Status: DC | PRN

## 2013-03-08 MED ORDER — MIDAZOLAM HCL 2 MG/2ML IJ SOLN
INTRAMUSCULAR | Status: AC
  Filled 2013-03-08: qty 2

## 2013-03-08 MED ORDER — SENNA 8.6 MG PO TABS
1.0000 | ORAL_TABLET | Freq: Two times a day (BID) | ORAL | Status: DC
  Administered 2013-03-08: 8.6 mg via ORAL
  Filled 2013-03-08 (×3): qty 1

## 2013-03-08 MED ORDER — FENTANYL CITRATE 0.05 MG/ML IJ SOLN
INTRAMUSCULAR | Status: AC
Start: 2013-03-08 — End: 2013-03-08
  Filled 2013-03-08: qty 2

## 2013-03-08 MED ORDER — FENTANYL CITRATE 0.05 MG/ML IJ SOLN
INTRAMUSCULAR | Status: AC
  Filled 2013-03-08: qty 2

## 2013-03-08 MED ORDER — VANCOMYCIN HCL IN DEXTROSE 1-5 GM/200ML-% IV SOLN
1000.0000 mg | Freq: Two times a day (BID) | INTRAVENOUS | Status: DC
  Filled 2013-03-08: qty 200

## 2013-03-08 MED ORDER — FENTANYL CITRATE 0.05 MG/ML IJ SOLN: 25.0000 ug | INTRAMUSCULAR | Status: DC | PRN

## 2013-03-08 MED ORDER — BUPIVACAINE-EPINEPHRINE PF 0.5-1:200000 % IJ SOLN
INTRAMUSCULAR | Status: DC | PRN
  Administered 2013-03-08 (×2): 30 mL

## 2013-03-08 MED ORDER — ROCURONIUM BROMIDE 100 MG/10ML IV SOLN
INTRAVENOUS | Status: DC | PRN
  Administered 2013-03-08: 30 mg via INTRAVENOUS

## 2013-03-08 MED ORDER — METOCLOPRAMIDE HCL 5 MG/ML IJ SOLN: 5.0000 mg | Freq: Three times a day (TID) | INTRAMUSCULAR | Status: DC | PRN

## 2013-03-08 MED ORDER — ENOXAPARIN SODIUM 40 MG/0.4ML ~~LOC~~ SOLN
40.0000 mg | SUBCUTANEOUS | Status: DC
  Administered 2013-03-09 – 2013-03-10 (×2): 40 mg via SUBCUTANEOUS
  Filled 2013-03-08 (×2): qty 0.4

## 2013-03-08 MED ORDER — ACETAMINOPHEN 325 MG PO TABS
650.0000 mg | ORAL_TABLET | Freq: Four times a day (QID) | ORAL | Status: DC | PRN
  Administered 2013-03-09: 650 mg via ORAL
  Filled 2013-03-08 (×2): qty 2

## 2013-03-08 MED ORDER — ETOMIDATE 2 MG/ML IV SOLN
INTRAVENOUS | Status: DC | PRN
  Administered 2013-03-08: 10 mg via INTRAVENOUS

## 2013-03-08 MED ORDER — MIDAZOLAM HCL 2 MG/2ML IJ SOLN
1.0000 mg | INTRAMUSCULAR | Status: DC | PRN
  Administered 2013-03-08 (×2): 2 mg via INTRAVENOUS

## 2013-03-08 MED ORDER — VANCOMYCIN HCL IN DEXTROSE 1-5 GM/200ML-% IV SOLN
INTRAVENOUS | Status: AC
  Filled 2013-03-08: qty 200

## 2013-03-08 MED ORDER — MIDAZOLAM HCL 5 MG/5ML IJ SOLN
INTRAMUSCULAR | Status: DC | PRN
  Administered 2013-03-08: 2 mg via INTRAVENOUS

## 2013-03-08 MED ORDER — FENTANYL CITRATE 0.05 MG/ML IJ SOLN
INTRAMUSCULAR | Status: DC | PRN
  Administered 2013-03-08 (×2): 50 ug via INTRAVENOUS
  Administered 2013-03-08: 25 ug via INTRAVENOUS

## 2013-03-08 MED ORDER — MAGNESIUM CITRATE PO SOLN: 1.0000 | Freq: Once | ORAL | Status: AC | PRN

## 2013-03-08 MED ORDER — ENOXAPARIN SODIUM 30 MG/0.3ML ~~LOC~~ SOLN: 30.0000 mg | SUBCUTANEOUS | Status: DC

## 2013-03-08 MED ORDER — DOCUSATE SODIUM 100 MG PO CAPS
100.0000 mg | ORAL_CAPSULE | Freq: Two times a day (BID) | ORAL | Status: DC
  Administered 2013-03-08: 100 mg via ORAL

## 2013-03-08 MED ORDER — ALUM & MAG HYDROXIDE-SIMETH 200-200-20 MG/5ML PO SUSP: 30.0000 mL | ORAL | Status: DC | PRN

## 2013-03-08 MED ORDER — PROPOFOL INFUSION 10 MG/ML OPTIME
INTRAVENOUS | Status: DC | PRN
  Administered 2013-03-08: 30 ug/kg/min via INTRAVENOUS

## 2013-03-08 MED ORDER — METOCLOPRAMIDE HCL 10 MG PO TABS: 5.0000 mg | ORAL_TABLET | Freq: Three times a day (TID) | ORAL | Status: DC | PRN

## 2013-03-08 MED ORDER — LIDOCAINE HCL (PF) 1 % IJ SOLN
INTRAMUSCULAR | Status: AC
  Filled 2013-03-08: qty 5

## 2013-03-08 MED ORDER — ONDANSETRON HCL 4 MG PO TABS: 4.0000 mg | ORAL_TABLET | Freq: Four times a day (QID) | ORAL | Status: DC | PRN

## 2013-03-08 MED ORDER — ONDANSETRON HCL 4 MG/2ML IJ SOLN: 4.0000 mg | Freq: Once | INTRAMUSCULAR | Status: DC | PRN

## 2013-03-08 SURGICAL SUPPLY — 55 items
BAG HAMPER (MISCELLANEOUS) ×2 IMPLANT
BANDAGE GAUZE ELAST BULKY 4 IN (GAUZE/BANDAGES/DRESSINGS) IMPLANT
BIT DRILL AO GAMMA 4.2X300 (BIT) ×2 IMPLANT
BLADE HEX COATED 2.75 (ELECTRODE) ×2 IMPLANT
BLADE SURG SZ10 CARB STEEL (BLADE) ×4 IMPLANT
CHLORAPREP W/TINT 10.5 ML (MISCELLANEOUS) ×2 IMPLANT
CHLORAPREP W/TINT 26ML (MISCELLANEOUS) ×2 IMPLANT
CLOTH BEACON ORANGE TIMEOUT ST (SAFETY) ×2 IMPLANT
COVER LIGHT HANDLE STERIS (MISCELLANEOUS) ×4 IMPLANT
COVER MAYO STAND XLG (DRAPE) ×2 IMPLANT
DECANTER SPIKE VIAL GLASS SM (MISCELLANEOUS) ×2 IMPLANT
DRAPE STERI IOBAN 125X83 (DRAPES) ×2 IMPLANT
DRSG MEPILEX BORDER 4X12 (GAUZE/BANDAGES/DRESSINGS) ×2 IMPLANT
ELECT REM PT RETURN 9FT ADLT (ELECTROSURGICAL) ×2
ELECTRODE REM PT RTRN 9FT ADLT (ELECTROSURGICAL) ×1 IMPLANT
GLOVE BIOGEL PI IND STRL 7.0 (GLOVE) ×4 IMPLANT
GLOVE BIOGEL PI IND STRL 8 (GLOVE) ×1 IMPLANT
GLOVE BIOGEL PI INDICATOR 7.0 (GLOVE) ×4
GLOVE BIOGEL PI INDICATOR 8 (GLOVE) ×1
GLOVE ECLIPSE 6.5 STRL STRAW (GLOVE) ×2 IMPLANT
GLOVE ECLIPSE 7.0 STRL STRAW (GLOVE) ×2 IMPLANT
GLOVE SKINSENSE NS SZ8.0 LF (GLOVE) ×1
GLOVE SKINSENSE STRL SZ8.0 LF (GLOVE) ×1 IMPLANT
GLOVE SS BIOGEL STRL SZ 6.5 (GLOVE) ×1 IMPLANT
GLOVE SS N UNI LF 8.5 STRL (GLOVE) ×2 IMPLANT
GLOVE SUPERSENSE BIOGEL SZ 6.5 (GLOVE) ×1
GOWN STRL REIN XL XLG (GOWN DISPOSABLE) ×10 IMPLANT
GUIDEROD T2 3X1000 (ROD) ×2 IMPLANT
INST SET MAJOR BONE (KITS) ×2 IMPLANT
K-WIRE  3.2X450M STR (WIRE) ×1
K-WIRE 3.2X450M STR (WIRE) ×1
KIT BLADEGUARD II DBL (SET/KITS/TRAYS/PACK) ×2 IMPLANT
KIT ROOM TURNOVER AP CYSTO (KITS) ×2 IMPLANT
KWIRE 3.2X450M STR (WIRE) ×1 IMPLANT
MANIFOLD NEPTUNE II (INSTRUMENTS) ×2 IMPLANT
MARKER SKIN DUAL TIP RULER LAB (MISCELLANEOUS) ×2 IMPLANT
NAIL TROCH GAMMA 11X18 (Nail) ×2 IMPLANT
NEEDLE HYPO 21X1.5 SAFETY (NEEDLE) ×2 IMPLANT
NEEDLE SPNL 18GX3.5 QUINCKE PK (NEEDLE) ×2 IMPLANT
NS IRRIG 1000ML POUR BTL (IV SOLUTION) ×2 IMPLANT
PACK BASIC III (CUSTOM PROCEDURE TRAY) ×1
PACK SRG BSC III STRL LF ECLPS (CUSTOM PROCEDURE TRAY) ×1 IMPLANT
PENCIL HANDSWITCHING (ELECTRODE) ×2 IMPLANT
SCREW LAG GAMMA 3 95MM (Screw) ×2 IMPLANT
SCREW LOCKING T2 F/T  5MMX40MM (Screw) ×1 IMPLANT
SCREW LOCKING T2 F/T 5MMX40MM (Screw) ×1 IMPLANT
SET BASIN LINEN APH (SET/KITS/TRAYS/PACK) ×2 IMPLANT
SPONGE LAP 18X18 X RAY DECT (DISPOSABLE) ×4 IMPLANT
STAPLER VISISTAT 35W (STAPLE) ×2 IMPLANT
SUT MNCRL 0 VIOLET CTX 36 (SUTURE) ×2 IMPLANT
SUT MON AB 2-0 CT1 36 (SUTURE) ×2 IMPLANT
SUT MONOCRYL 0 CTX 36 (SUTURE) ×2
SYR 30ML LL (SYRINGE) ×4 IMPLANT
SYR BULB IRRIGATION 50ML (SYRINGE) ×4 IMPLANT
YANKAUER SUCT 12FT TUBE ARGYLE (SUCTIONS) ×2 IMPLANT

## 2013-03-08 NOTE — Anesthesia Postprocedure Evaluation (Signed)
  Anesthesia Post-op Note  Patient: Ellen Anderson  Procedure(s) Performed: Procedure(s): INTRAMEDULLARY (IM) NAIL INTERTROCHANTRIC (Right)  Patient Location: PACU  Anesthesia Type:General  Level of Consciousness: awake and alert   Airway and Oxygen Therapy: Patient Spontanous Breathing and Patient connected to face mask oxygen  Post-op Pain: none  Post-op Assessment: Post-op Vital signs reviewed, Patient's Cardiovascular Status Stable, Respiratory Function Stable, Patent Airway and No signs of Nausea or vomiting  Post-op Vital Signs: Reviewed and stable  Complications: No apparent anesthesia complications

## 2013-03-08 NOTE — Progress Notes (Signed)
Subjective: This patient is fairly comfortable this morning she has a mildly displaced intratrochanteric fracture right hip and is to have surgery today  Objective: Vital signs in last 24 hours: Temp:  [98 F (36.7 C)-102.5 F (39.2 C)] 98.9 F (37.2 C) (04/02 0551) Pulse Rate:  [82-94] 88 (04/02 0551) Resp:  [20] 20 (04/02 0551) BP: (103-117)/(65-73) 103/66 mmHg (04/02 0551) SpO2:  [90 %-91 %] 91 % (04/02 0551) Weight change:  Last BM Date: 03/05/13  Intake/Output from previous day: 04/01 0701 - 04/02 0700 In: 677.5 [P.O.:120; I.V.:557.5] Out: -  Intake/Output this shift: Total I/O In: 557.5 [I.V.:557.5] Out: -   Physical Exam: Vital signs blood pressure 103/66 respiration 20 pulse 88 temp 98 HEENT negative  Lungs P&A  Heart regular rhythm no murmurs  Abdomen the palpable organs or masses  Extremities tenderness over the right hip no edema  Recent Labs  03/06/13 0513 03/07/13 0123  WBC 7.8  --   HGB 10.2* 11.4*  HCT 30.9* 33.8*  PLT 167  --    BMET  Recent Labs  03/06/13 0513  NA 137  K 4.2  CL 105  CO2 26  GLUCOSE 109*  BUN 8  CREATININE 0.70  CALCIUM 8.7    Studies/Results: No results found.  Medications:  . chlorhexidine  60 mL Topical Once  . Chlorhexidine Gluconate Cloth  6 each Topical Daily  . docusate sodium  100 mg Oral BID  . fentaNYL  25 mcg Transdermal Q72H  . mupirocin ointment  1 application Nasal BID  . potassium chloride SA  20 mEq Oral Daily  . vancomycin  1,000 mg Intravenous On Call to OR    . 0.9 % NaCl with KCl 20 mEq / L 50 mL/hr at 03/06/13 1722     Assessment/Plan: 1 the patient has intertrochanteric fracture right hip which is mildly displaced  2 patient has slightly low serum potassium level which is normalized slightly low hemoglobin  Patient is to have the surgery today by orthopedic surgeon   LOS: 4 days   Ellen Anderson G 03/08/2013, 6:35 AM

## 2013-03-08 NOTE — Anesthesia Procedure Notes (Addendum)
Spinal  Patient location during procedure: OR Start time: 03/08/2013 1:10 PM Staffing CRNA/Resident: Glynn Octave E Preanesthetic Checklist Completed: patient identified, site marked, surgical consent, pre-op evaluation, timeout performed, IV checked, risks and benefits discussed and monitors and equipment checked Spinal Block Patient position: right lateral decubitus Prep: Betadine Patient monitoring: heart rate, cardiac monitor, continuous pulse ox and blood pressure Approach: right paramedian Location: L3-4 Injection technique: single-shot Needle Needle type: Spinocan  Needle gauge: 22 G Needle length: 9 cm Assessment Sensory level: T8 Additional Notes ATTEMPTS: see note TRAY ZO:[id number] TRAY EXPIRATION DATE:11/2013 CRNA attempted paramedian approach without success.  Dr. Jayme Cloud called in at 1340 and attempted.  Unsuccessful and converted to General with ETT.  Procedure Name: Intubation Date/Time: 03/08/2013 2:04 PM Performed by: Glynn Octave E Pre-anesthesia Checklist: Patient identified, Patient being monitored, Timeout performed, Emergency Drugs available and Suction available Patient Re-evaluated:Patient Re-evaluated prior to inductionOxygen Delivery Method: Circle System Utilized Preoxygenation: Pre-oxygenation with 100% oxygen Intubation Type: IV induction, Rapid sequence and Cricoid Pressure applied Ventilation: Mask ventilation without difficulty Laryngoscope Size: Mac and 3 Grade View: Grade I Tube type: Oral Tube size: 7.0 mm Number of attempts: 1 Airway Equipment and Method: stylet Placement Confirmation: ETT inserted through vocal cords under direct vision,  positive ETCO2 and breath sounds checked- equal and bilateral Secured at: 21 cm Tube secured with: Tape Dental Injury: Teeth and Oropharynx as per pre-operative assessment

## 2013-03-08 NOTE — Anesthesia Preprocedure Evaluation (Addendum)
Anesthesia Evaluation  Patient identified by MRN, date of birth, ID band Patient awake    Reviewed: Allergy & Precautions, H&P , NPO status   History of Anesthesia Complications Negative for: history of anesthetic complications  Airway Mallampati: II TM Distance: >3 FB Neck ROM: Full    Dental  (+) Edentulous Upper and Edentulous Lower   Pulmonary neg pulmonary ROS,  breath sounds clear to auscultation        Cardiovascular hypertension, Pt. on medications Rhythm:Regular Rate:Normal     Neuro/Psych Seizures -, Well Controlled,     GI/Hepatic negative GI ROS,   Endo/Other    Renal/GU      Musculoskeletal   Abdominal   Peds  Hematology   Anesthesia Other Findings   Reproductive/Obstetrics                          Anesthesia Physical Anesthesia Plan  ASA: III  Anesthesia Plan: Spinal   Post-op Pain Management:    Induction:   Airway Management Planned: Nasal Cannula  Additional Equipment:   Intra-op Plan:   Post-operative Plan:   Informed Consent: I have reviewed the patients History and Physical, chart, labs and discussed the procedure including the risks, benefits and alternatives for the proposed anesthesia with the patient or authorized representative who has indicated his/her understanding and acceptance.     Plan Discussed with:   Anesthesia Plan Comments:         Anesthesia Quick Evaluation

## 2013-03-08 NOTE — Op Note (Signed)
Preop diagnosis closed RIGHT intertrochanteric fracture of the hip Postop diagnosis same Procedure closed reduction internal fixation, open treatment internal fixation RIGHT HIP Surgeon Romeo Apple Assisted BY [employer] Operative findings 2 part intertrochanteric fracture RIGHT HIP Implants 125 gamma nail with acorn in dynamic mode, sliding screw in dynamic mode, 95 lag screw, 40 locking bolt   After site marking and chart update the patient was taken to the operating room where A SPINAL WAS ATTEMPTED BUT WAS UNSUCCESSFUL, she received spinal anesthesia. She was placed on the fracture table with padding of the right leg. We noticed a left great to heel pressure sore and a grade 1 dorsal pressure sore also on the left foot.  In traction and manipulation of the limb was performed under C-arm guidance until a closed stable reduction was obtained. The leg was then prepped and draped sterilely. After completing the timeout a lateral incision was made over the greater trochanter extended proximally. Subcutaneous tissues were divided and the subcutaneous hemorrhage was controlled with electrocautery. Further dissection was carried out to the fascia which was split in line with the skin incision and then blunt dissection was carried out in the abductor musculature into the greater trochanter was palpated. A curved awl was passed into the greater trochanter into the femoral canal. This was confirmed by x-ray. A guidewire was placed into the cannulated awl and passed to the knee and confirmed by x-ray. Using manufacturer's recommended technique reaming was performed over the guidewire to the level of the lesser trochanter. 125 nail was passed over the guidewire.  A lateral incision was made in preparation for the lag screw. Using the cannula and perforating drill the guidewire was placed across the fracture site into the femoral head. X-rays confirmed position. The pin was then measured. A 95 mm setting was  placed on the reamer and the reamer was passed over the guidewire under C-arm guidance. The 95 lag screw was placed. The acorn was then placed proximally. The acorn was placed in a dynamic mode. The acorn was confirmed to be engaged per manufacture a technique.  Traction was then released.  We then made a second stab wound laterally past the locking bolt cannula drilled across the nail and passed a 40 mm locking bolt.  Wounds were then irrigated radiographs confirmed position of the fracture and of the implant.  Layered closure was performed proximally with 0 Monocryl and 2-0 Monocryl followed by reapproximation of skin edges with staples  The 2 distal incisions were closed with staples.  A total of 60 cc of Marcaine was injected into the wound area with 30 cc injected beneath the fascial layer and 30 cc in the subcutaneous layer.  Sterile dressing was applied  The patient was taken to recovery room in stable condition  Postop plan is for full weightbearing.

## 2013-03-08 NOTE — Transfer of Care (Signed)
Immediate Anesthesia Transfer of Care Note  Patient: Ellen Anderson  Procedure(s) Performed: Procedure(s): INTRAMEDULLARY (IM) NAIL INTERTROCHANTRIC (Right)  Patient Location: PACU  Anesthesia Type:General  Level of Consciousness: awake, alert  and oriented  Airway & Oxygen Therapy: Patient Spontanous Breathing and Patient connected to face mask oxygen  Post-op Assessment: Report given to PACU RN and Post -op Vital signs reviewed and stable  Post vital signs: Reviewed and stable  Complications: No apparent anesthesia complications

## 2013-03-08 NOTE — Brief Op Note (Signed)
03/04/2013 - 03/08/2013  3:43 PM  PATIENT:  Ellen Anderson  67 y.o. female  PRE-OPERATIVE DIAGNOSIS:  intertrochanteric fracture right hip  POST-OPERATIVE DIAGNOSIS:  intertrochanteric fracture right hip  IMPLANTS: STRYKER GAMMA NAIL . SHORT. 180MM/ 95 LAG SCREW/ 40 LOCKING SCREW/ ACORN DYNAMIC  PROCEDURE:  Procedure(s): INTRAMEDULLARY (IM) NAIL INTERTROCHANTRIC (Right)  SURGEON:  Surgeon(s) and Role:    * Vickki Hearing, MD - Primary  PHYSICIAN ASSISTANT:   ASSISTANTS: HOLLY PROZTEK AND BETTY ASHLEY   ANESTHESIA:   general  EBL:  Total I/O In: 1000 [I.V.:1000] Out: 900 [Urine:800; Blood:100]  BLOOD ADMINISTERED:none  DRAINS: none   LOCAL MEDICATIONS USED:  OTHER 0.5% MARCAINE 60 CC   SPECIMEN:  No Specimen  DISPOSITION OF SPECIMEN:  N/A  COUNTS:  YES  TOURNIQUET:  * No tourniquets in log *  DICTATION: .Dragon Dictation  PLAN OF CARE: Admit to inpatient   PATIENT DISPOSITION:  PACU - hemodynamically stable.   Delay start of Pharmacological VTE agent (>24hrs) due to surgical blood loss or risk of bleeding: yes

## 2013-03-08 NOTE — Progress Notes (Signed)
Patient states she is comfortable after receiving her medication and would like to get some rest this morning.  Stating she will take her bath in a couple of hours for surgery.  Scheduled for surgery at 1220 today.  Will pass this information on to the first shift nurse.

## 2013-03-09 LAB — BASIC METABOLIC PANEL
Calcium: 8.4 mg/dL (ref 8.4–10.5)
GFR calc Af Amer: >90 mL/min (ref >90)
GFR calc non Af Amer: >90 mL/min (ref >90)
Glucose, Bld: 94 mg/dL (ref 70–99)
Potassium: 3.8 mEq/L (ref 3.5–5.1)
Sodium: 134 mEq/L — ABNORMAL LOW (ref 135–145)

## 2013-03-09 LAB — CBC
Hemoglobin: 10 g/dL — ABNORMAL LOW (ref 12.0–15.0)
MCH: 29.1 pg (ref 26.0–34.0)
MCHC: 34 g/dL (ref 30.0–36.0)
Platelets: 177 10*3/uL (ref 150–400)
RDW: 13.7 % (ref 11.5–15.5)

## 2013-03-09 MED ORDER — ALBUTEROL SULFATE (5 MG/ML) 0.5% IN NEBU
2.5000 mg | INHALATION_SOLUTION | RESPIRATORY_TRACT | Status: DC
  Filled 2013-03-09: qty 0.5

## 2013-03-09 NOTE — Progress Notes (Signed)
Pt received 1Unit of PRBCs per order. No complications. Sheryn Bison

## 2013-03-09 NOTE — Clinical Social Work Note (Addendum)
CSW presented bed offers to pt who chooses [facility]. She definitely feels she will require SNF at d/c. CSW will notify facility. Awaiting stability for d/c. CSW to continue to follow.  Derenda Fennel, [place] [phone]

## 2013-03-09 NOTE — Progress Notes (Signed)
UR Chart Review Completed  

## 2013-03-09 NOTE — Anesthesia Postprocedure Evaluation (Addendum)
  Anesthesia Post-op Note  Patient: Ellen Anderson  Procedure(s) Performed: Procedure(s): INTRAMEDULLARY (IM) NAIL INTERTROCHANTRIC (Right)  Patient Location: Nursing Unit  Anesthesia Type:General  Level of Consciousness: sedated and patient cooperative  Airway and Oxygen Therapy: Patient Spontanous Breathing  Post-op Pain: mild  Post-op Assessment: Post-op Vital signs reviewed and No signs of Nausea or vomiting  Post-op Vital Signs: Reviewed B/P low will receive RBC'S  Complications: No apparent anesthesia complications

## 2013-03-09 NOTE — Progress Notes (Addendum)
Patient stated to me that she is allergic to albuterol.  She states it makes her itch and break out in hives.  D/c'd albuterol.  BBS are clear and diminished.

## 2013-03-09 NOTE — Progress Notes (Signed)
Notified Dr. Renard Matter of pts frequent, small amts of diarrhea. Nursing to monitor, will notify of any significant increase. Sheryn Bison

## 2013-03-09 NOTE — Progress Notes (Addendum)
Pt c/o not being able to urinate after foley removal. Bladder scan shows >200cc. Order received for reinsertion of Foley catheter due to acute retention.Sheryn Bison

## 2013-03-09 NOTE — Progress Notes (Signed)
PT Cancellation Note  Patient Details Name: Ellen Anderson MRN: [id number] DOB: 10/19/1946   Cancelled Treatment:    Reason Eval/Treat Not Completed: Other (comment) Attempted to see pt to begin PT.  Pt stated emphatically:  "we're not doing no therapy today".  After I had left the room, she called me back in to ask for a w/c to be left by the bedside.  I inquired as to why she needed it and she said "so that I can get up to go to the bathroom when I need to go".  I asked why, then, would she not work with me today if she was planning to get OOB with the w/c.  She said "I'm going to do it by myself".  I spoke VERY clearly that she was NOT to try to get OOB by herself.  When I left the room she appeared to understand and was resigned to her situation.  Myrlene Broker L 03/09/2013, 8:44 AM

## 2013-03-09 NOTE — Progress Notes (Signed)
NAMESHERMAN, LIPUMA                 ACCOUNT NO.:  [id number]  MEDICAL RECORD NO.:  [id number]  LOCATION:  A325                          FACILITY:  [facility]  PHYSICIAN:  Hannah Crill G. Renard Matter, MD   DATE OF BIRTH:  1946-02-11  DATE OF PROCEDURE: DATE OF DISCHARGE:                                PROGRESS NOTE   This patient was taken to surgery yesterday with the preop diagnosis being intertrochanteric fracture of the right hip.  The patient did have general anesthesia and had screw placed in hip at fracture site. Apparently, tolerated this well and is fairly comfortable this morning, but still having pain in her hip.  OBJECTIVE:  VITAL SIGNS:  Blood pressure 99/45, respirations 20, pulse 94, temp 101.6. LUNGS:  Clear to P and A. HEART:  Regular rhythm. ABDOMEN:  No palpable organs or masses.  The patient does have some tenderness over the right hip.  ASSESSMENT:  The patient has intertrochanteric fracture of the right hip.  Was taken to surgery by Orthopedic Service and tolerated the procedure in satisfactory manner.  PLAN:  To continue current medications.     Zoriyah Scheidegger G. Renard Matter, MD     AGM/MEDQ  D:  03/09/2013  T:  03/09/2013  Job:  [id number]

## 2013-03-09 NOTE — Progress Notes (Signed)
Subjective: 1 Day Post-Op Procedure(s) (LRB): INTRAMEDULLARY (IM) NAIL INTERTROCHANTRIC (Right) Patient reports pain as 3 on 0-10 scale and 4 on 0-10 scale.    Acute blood loss anemia prior to surgery and post surgery   Objective: Vital signs in last 24 hours: BP 99/45  Pulse 94  Temp(Src) 101.6 F (38.7 C) (Oral)  Resp 20  Ht 5\' 2"  (1.575 m)  Wt 164 lb (74.39 kg)  BMI 29.99 kg/m2  SpO2 90%  Intake/Output from previous day: 04/02 0701 - 04/03 0700 In: 2823.3 [I.V.:2823.3] Out: 1952 [Urine:1850; Stool:2; Blood:100] Intake/Output this shift:     Recent Labs  03/07/13 0123 03/08/13 1805 03/09/13 0544  HGB 11.4* 11.0* 10.0*    Recent Labs  03/08/13 1805 03/09/13 0544  WBC 8.0 6.2  RBC 3.75* 3.44*  HCT 32.3* 29.4*  PLT 160 177    Recent Labs  03/08/13 1805 03/09/13 0544  NA  --  134*  K  --  3.8  CL  --  98  CO2  --  29  BUN  --  6  CREATININE 0.51 0.59  GLUCOSE  --  94  CALCIUM  --  8.4   No results found for this basename: LABPT, INR,  in the last 72 hours  Neurologically intact Incision: dressing C/D/I  Assessment/Plan: 1 Day Post-Op Procedure(s) (LRB): INTRAMEDULLARY (IM) NAIL INTERTROCHANTRIC (Right) Advance diet Up with therapy The patient will need blood transfusion, like to check her urine and start respiratory therapy secondary to her fever she has some acute blood loss anemia prior to surgery as well as during. She also had some low blood pressures so we will give her a unit of blood for that.   Fuller Canada 03/09/2013, 7:44 AM

## 2013-03-09 NOTE — Clinical Social Work Placement (Signed)
Clinical Social Work Department CLINICAL SOCIAL WORK PLACEMENT NOTE 03/09/2013  Patient:  Ellen Anderson, Ellen Anderson  Account Number:  [id number] Admit date:  03/04/2013  Clinical Social Worker:  Derenda Fennel, LCSW  Date/time:  03/09/2013 11:15 AM  Clinical Social Work is seeking post-discharge placement for this patient at the following level of care:   SKILLED NURSING   (*CSW will update this form in Epic as items are completed)   03/06/2013  Patient/family provided with [facility] Department of Clinical Social Work's list of facilities offering this level of care within the geographic area requested by the patient (or if unable, by the patient's family).  03/06/2013  Patient/family informed of their freedom to choose among providers that offer the needed level of care, that participate in Medicare, Medicaid or managed care program needed by the patient, have an available bed and are willing to accept the patient.  03/06/2013  Patient/family informed of [facility]' ownership interest in [facility], as well as of the fact that they are under no obligation to receive care at this facility.  PASARR submitted to EDS on  PASARR number received from EDS on   FL2 transmitted to all facilities in geographic area requested by pt/family on  03/06/2013 FL2 transmitted to all facilities within larger geographic area on   Patient informed that his/her managed care company has contracts with or will negotiate with  certain facilities, including the following:     Patient/family informed of bed offers received:  03/09/2013 Patient chooses bed at [facility] [place] Physician recommends and patient chooses bed at  [facility] OF [place]  Patient to be transferred to  on   Patient to be transferred to facility by   The following physician request were entered in Epic:   Additional Comments:  Derenda Fennel, LCSW [phone]

## 2013-03-10 LAB — URINALYSIS, ROUTINE W REFLEX MICROSCOPIC
Bilirubin Urine: NEGATIVE
Glucose, UA: NEGATIVE mg/dL
Ketones, ur: 15 mg/dL — AB
Leukocytes, UA: NEGATIVE
Nitrite: NEGATIVE
Protein, ur: NEGATIVE mg/dL
Specific Gravity, Urine: 1.01 (ref 1.005–1.030)
Urobilinogen, UA: 4 mg/dL — ABNORMAL HIGH (ref 0.0–1.0)
pH: 6 (ref 5.0–8.0)

## 2013-03-10 LAB — TYPE AND SCREEN
ABO/RH(D): O POS
Antibody Screen: NEGATIVE
Unit division: 0

## 2013-03-10 LAB — URINE MICROSCOPIC-ADD ON

## 2013-03-10 LAB — BASIC METABOLIC PANEL
BUN: 7 mg/dL (ref 6–23)
Calcium: 8.4 mg/dL (ref 8.4–10.5)
Chloride: 101 mEq/L (ref 96–112)
Creatinine, Ser: 0.44 mg/dL — ABNORMAL LOW (ref 0.50–1.10)
GFR calc Af Amer: >90 mL/min (ref >90)
GFR calc non Af Amer: >90 mL/min (ref >90)
Potassium: 3.4 mEq/L — ABNORMAL LOW (ref 3.5–5.1)

## 2013-03-10 LAB — CBC
HCT: 31.6 % — ABNORMAL LOW (ref 36.0–46.0)
MCH: 28.5 pg (ref 26.0–34.0)
MCV: 84.3 fL (ref 78.0–100.0)
Platelets: 178 10*3/uL (ref 150–400)
RDW: 14.5 % (ref 11.5–15.5)
WBC: 6.4 10*3/uL (ref 4.0–10.5)

## 2013-03-10 MED ORDER — DIAZEPAM 10 MG PO TABS: 10.0000 mg | ORAL_TABLET | Freq: Three times a day (TID) | ORAL | Status: DC | PRN

## 2013-03-10 MED ORDER — POTASSIUM CHLORIDE CRYS ER 20 MEQ PO TBCR: 20.0000 meq | EXTENDED_RELEASE_TABLET | Freq: Three times a day (TID) | ORAL | Status: AC

## 2013-03-10 MED ORDER — FENTANYL 12 MCG/HR TD PT72: 1.0000 | MEDICATED_PATCH | TRANSDERMAL | Status: DC

## 2013-03-10 MED ORDER — FENTANYL 12 MCG/HR TD PT72: 12.5000 ug | MEDICATED_PATCH | TRANSDERMAL | Status: DC

## 2013-03-10 MED ORDER — FENTANYL 25 MCG/HR TD PT72
37.5000 ug | MEDICATED_PATCH | TRANSDERMAL | Status: DC
  Administered 2013-03-10: 37.5 ug via TRANSDERMAL
  Filled 2013-03-10: qty 1

## 2013-03-10 MED ORDER — FENTANYL 25 MCG/HR TD PT72: 1.0000 | MEDICATED_PATCH | TRANSDERMAL | Status: DC

## 2013-03-10 MED ORDER — ENOXAPARIN SODIUM 40 MG/0.4ML ~~LOC~~ SOLN: 40.0000 mg | SUBCUTANEOUS | Status: DC

## 2013-03-10 NOTE — Plan of Care (Signed)
Problem: Discharge Progression Outcomes Goal: Complications resolved/controlled Outcome: Completed/Met Date Met:  03/10/13 To [facility] for rehab Goal: Follows weight - bearing limitations Outcome: Not Applicable Date Met:  03/10/13 WBAT Goal: Other Discharge Outcomes/Goals Outcome: Completed/Met Date Met:  03/10/13 Discharged to [facility] for rehab.  Pt transportation arranged through [facility] Report called to April

## 2013-03-10 NOTE — Progress Notes (Signed)
NAMEJAYLENN, BAIZA                 ACCOUNT NO.:  [id number]  MEDICAL RECORD NO.:  [id number]  LOCATION:  A325                          FACILITY:  [facility]  PHYSICIAN:  Jaydyn Bozzo G. Renard Matter, MD   DATE OF BIRTH:  1946-08-03  DATE OF PROCEDURE: DATE OF DISCHARGE:                                PROGRESS NOTE   This patient continues to improve following surgery for intertrochanteric fracture of her right heel.  She did have difficulty voiding.  A Foley catheter was inserted.  She did receive a unit of blood yesterday because of her low hemoglobin.  She remains fairly comfortable.  OBJECTIVE:  VITAL SIGNS:  Blood pressure 111/73, respirations 18, pulse 85, temp 98.6. LUNGS:  Clear to P and A. HEART:  Regular rhythm. ABDOMEN:  No palpable organs or masses.  The patient does have some tenderness over the right hip.  ASSESSMENT:  The patient has intertrochanteric fracture of the right hip.  She does have mild anemia.  PLAN:  To continue current regimen.  The patient will be placed in nursing facility for rehab.     Alfard Cochrane G. Renard Matter, MD     AGM/MEDQ  D:  03/10/2013  T:  03/10/2013  Job:  [id number]

## 2013-03-10 NOTE — Discharge Summary (Signed)
Physician Discharge Summary  Patient ID: Ellen Anderson MRN: [id number] DOB/AGE: Apr 10, 1946 67 y.o.  Admit date: 03/04/2013 Discharge date: 03/10/2013  Admission Diagnoses: Right intertrochanteric hip fracture  Discharge Diagnoses: Same, hypokalemia, acute blood loss anemia Principal Problem:   Hip fracture, right Active Problems:   Anemia   Back pain, chronic   Hypokalemia   Discharged Condition: good  Hospital Course: The patient underwent internal fixation of her right hip fracture 03/08/2013 with a short gamma nail. Pre-and postoperatively she was anemic and had one transfusion prior to surgery 1 transfusion after surgery. Her hemoglobin is now 10.4  Her potassium was low she was treated with oral potassium and her potassium is 3.4  The Foley catheter was removed per protocol postop day 1 she had urinary retention over 200 cc and the catheter was replaced.  CBC    Component Value Date/Time   WBC 6.4 03/10/2013 0544   RBC 3.75* 03/10/2013 0544   HGB 10.7* 03/10/2013 0544   HCT 31.6* 03/10/2013 0544   PLT 178 03/10/2013 0544   MCV 84.3 03/10/2013 0544   MCH 28.5 03/10/2013 0544   MCHC 33.9 03/10/2013 0544   RDW 14.5 03/10/2013 0544   LYMPHSABS 1.4 03/05/2013 0039   MONOABS 0.6 03/05/2013 0039   EOSABS 0.1 03/05/2013 0039   BASOSABS 0.0 03/05/2013 0039     BMET    Component Value Date/Time   NA 136 03/10/2013 0544   K 3.4* 03/10/2013 0544   CL 101 03/10/2013 0544   CO2 26 03/10/2013 0544   GLUCOSE 89 03/10/2013 0544   BUN 7 03/10/2013 0544   CREATININE 0.44* 03/10/2013 0544   CALCIUM 8.4 03/10/2013 0544   GFRNONAA >90 03/10/2013 0544   GFRAA >90 03/10/2013 0544      Discharge Exam: Blood pressure 98/63, pulse 90, temperature 98.5 F (36.9 C), temperature source Oral, resp. rate 22, height 5\' 2"  (1.575 m), weight 164 lb (74.39 kg), SpO2 92.00%. General appearance: alert and cooperative Pulses: 2+ and symmetric Skin: Skin color, texture, turgor normal. No rashes or lesions Incision/Wound:  dressing was changed today the wound is without any drainage staple line looks clean.  Disposition: 03-Skilled Nursing Facility  Discharge Orders   Future Orders Complete By Expires     Call MD / Call 911  As directed     Comments:      If you experience chest pain or shortness of breath, CALL 911 and be transported to the hospital emergency room.  If you develope a fever above 101 F, pus (white drainage) or increased drainage or redness at the wound, or calf pain, call your surgeon's office.    Constipation Prevention  As directed     Comments:      Drink plenty of fluids.  Prune juice may be helpful.  You may use a stool softener, such as Colace (over the counter) 100 mg twice a day.  Use MiraLax (over the counter) for constipation as needed.    Diet - low sodium heart healthy  As directed     Discharge instructions  As directed     Comments:      WBAT   NO HIP PRECAUTIONS   REMOVE FOLEY Sunday 4-6  STAPLES OUT POD 12    Discharge wound care:  As directed     Comments:      If you have a hip bandage, keep it clean and dry.  Change your bandage as instructed by your health care providers.  If your  bandage has been discontinued, keep your incision clean and dry.  Pat dry after bathing.  DO NOT put lotion or powder on your incision.    Increase activity slowly as tolerated  As directed         Medication List    TAKE these medications       CENTRUM SILVER tablet  Take 1 tablet by mouth daily.     diazepam 10 MG tablet  Commonly known as:  VALIUM  Take 1 tablet (10 mg total) by mouth every 8 (eight) hours as needed. For muscle spasms     enoxaparin 40 MG/0.4ML injection  Commonly known as:  LOVENOX  Inject 0.4 mLs (40 mg total) into the skin daily.     fentaNYL 12 MCG/HR  Commonly known as:  DURAGESIC - dosed mcg/hr  Place 1 patch (12.5 mcg total) onto the skin every 3 (three) days.     fentaNYL 25 MCG/HR  Commonly known as:  DURAGESIC  Place 1 patch (25 mcg total)  onto the skin every 3 (three) days.     potassium chloride SA 20 MEQ tablet  Commonly known as:  K-DUR,KLOR-CON  Take 1 tablet (20 mEq total) by mouth 3 (three) times daily.     senna 8.6 MG Tabs  Commonly known as:  SENOKOT  Take 1 tablet (8.6 mg total) by mouth 2 (two) times daily.           Follow-up Information   Follow up with Fuller Canada, MD On 03/30/2013. (CHECK INCISION NO XRAYS )    Contact information:   [address], [address] [place] [address] [phone]       Signed: Fuller Canada 03/10/2013, 10:18 AM

## 2013-03-10 NOTE — Clinical Social Work Note (Signed)
Pt d/c today to [facility]. Pt and facility aware and agreeable. D/C summary faxed. Pt arranging transport via Orlene Plum, LCSW [phone]

## 2013-03-10 NOTE — Clinical Social Work Placement (Signed)
Clinical Social Work Department CLINICAL SOCIAL WORK PLACEMENT NOTE 03/10/2013  Patient:  Ellen Anderson, Ellen Anderson  Account Number:  [id number] Admit date:  03/04/2013  Clinical Social Worker:  Derenda Fennel, LCSW  Date/time:  03/09/2013 11:15 AM  Clinical Social Work is seeking post-discharge placement for this patient at the following level of care:   SKILLED NURSING   (*CSW will update this form in Epic as items are completed)   03/06/2013  Patient/family provided with [facility] Department of Clinical Social Work's list of facilities offering this level of care within the geographic area requested by the patient (or if unable, by the patient's family).  03/06/2013  Patient/family informed of their freedom to choose among providers that offer the needed level of care, that participate in Medicare, Medicaid or managed care program needed by the patient, have an available bed and are willing to accept the patient.  03/06/2013  Patient/family informed of [facility]' ownership interest in [facility], as well as of the fact that they are under no obligation to receive care at this facility.  PASARR submitted to EDS on  PASARR number received from EDS on   FL2 transmitted to all facilities in geographic area requested by pt/family on  03/06/2013 FL2 transmitted to all facilities within larger geographic area on   Patient informed that his/her managed care company has contracts with or will negotiate with  certain facilities, including the following:     Patient/family informed of bed offers received:  03/09/2013 Patient chooses bed at [facility] [place] Physician recommends and patient chooses bed at  [facility] [place]  Patient [facility] to [facility]  on  03/10/2013 Patient to be transferred to facility by [facility]  The following physician request were entered in Epic:   Additional Comments:  Derenda Fennel, LCSW [phone]

## 2013-03-10 NOTE — Evaluation (Signed)
Physical Therapy Evaluation Patient Details Name: Ellen Anderson MRN: [id number] DOB: [birth year] Today's Date: 03/10/2013 Time: 1130-1220 PT Time Calculation (min): 50 min  PT Assessment / Plan / Recommendation Clinical Impression  Pt was seen for initial evaluation/ts.  She normally lives alone, but when she returns home her son is going to stay with her for 2 months.  She is normally independent with ADLs.  Pt was alert and cooperative but had her wasy of doing things.  "I will know when I can do something or not".  She declined to do any ther ex, but does appear to have adequate strength in RLE.  She needed only min assist to transfer OOB and the ambulate 3' to a chair.  she is to be transferred to SNF this PM.    PT Assessment  All further PT needs can be met in the next venue of care    Follow Up Recommendations  SNF    Does the patient have the potential to tolerate intense rehabilitation      Barriers to Discharge        Equipment Recommendations  None recommended by PT    Recommendations for Other Services     Frequency      Precautions / Restrictions Precautions Precautions: Fall Restrictions Weight Bearing Restrictions: No RLE Weight Bearing: Weight bearing as tolerated   Pertinent Vitals/Pain       Mobility  Bed Mobility Bed Mobility: Supine to Sit;Sitting - Scoot to Edge of Bed Supine to Sit: 4: Min assist;HOB elevated Sitting - Scoot to [place] of Bed: 4: Min guard Details for Bed Mobility Assistance: pt had no pain with transfer to sitting Transfers Transfers: Sit to Stand;Stand to [employer] Transfers Sit to Stand: 4: Min assist;With upper extremity assist;From bed;From chair/3-in-1 Stand to Sit: 4: Min assist;With upper extremity assist;To chair/3-in-1 Stand Pivot Transfers: 3: Mod assist Ambulation/Gait Ambulation/Gait Assistance: 4: Min assist Ambulation Distance (Feet): 3 Feet Assistive device: Rolling walker Gait Pattern: Decreased step length  - right;Decreased step length - left;Decreased stance time - right;Decreased hip/knee flexion - left;Decreased hip/knee flexion - right Gait velocity: very slow and labored Stairs: No Wheelchair Mobility Wheelchair Mobility: No    Exercises     PT Diagnosis: Difficulty walking;Abnormality of gait;Generalized weakness;Acute pain  PT Problem List: Decreased strength;Decreased range of motion;Decreased activity tolerance;Decreased mobility;Pain PT Treatment Interventions:     PT Goals    Visit Information  Last PT Received On: 03/10/13    Subjective Data  Subjective: I'm going to do things my way Patient Stated Goal: none stated   Prior Functioning  Home Living Lives With: Son Available Help at Discharge: Family;Available 24 hours/day Type of Home: House Additional Comments: pt is so distractable that she never did anwer above questions Prior Function Level of Independence: Independent Able to Take Stairs?: Yes Driving: Yes Vocation: Retired Musician: No difficulties    Copywriter, advertising Overall Cognitive Status: Appears within functional limits for tasks assessed/performed Arousal/Alertness: Awake/alert Orientation Level: Appears intact for tasks assessed Behavior During Session: [facility] for tasks performed    Extremity/Trunk Assessment Right Lower Extremity Assessment RLE ROM/Strength/Tone: Unable to fully assess (pt declined any exercise) Left Lower Extremity Assessment LLE ROM/Strength/Tone: Within functional levels Trunk Assessment Trunk Assessment: Kyphotic   Balance    End of Session PT - End of Session Equipment Utilized During Treatment: Gait belt Activity Tolerance: Patient tolerated treatment well Patient left: in chair;with call bell/phone within reach;with chair alarm set Nurse Communication: Mobility  status  GP     Konrad Penta 03/10/2013, 12:29 PM

## 2013-03-10 NOTE — Progress Notes (Signed)
Report called to [facility]  I spoke with April Nursing at [facility]

## 2013-03-13 ENCOUNTER — Encounter (HOSPITAL_COMMUNITY): Payer: Self-pay | Admitting: Orthopedic Surgery

## 2013-03-30 ENCOUNTER — Ambulatory Visit (INDEPENDENT_AMBULATORY_CARE_PROVIDER_SITE_OTHER): Payer: Medicare Other | Admitting: Orthopedic Surgery

## 2013-03-30 ENCOUNTER — Encounter: Payer: Self-pay | Admitting: Orthopedic Surgery

## 2013-03-30 VITALS — BP 104/74 | Ht 62.0 in | Wt 151.0 lb

## 2013-03-30 DIAGNOSIS — S72141D Displaced intertrochanteric fracture of right femur, subsequent encounter for closed fracture with routine healing: Secondary | ICD-10-CM

## 2013-03-30 DIAGNOSIS — S72009D Fracture of unspecified part of neck of unspecified femur, subsequent encounter for closed fracture with routine healing: Secondary | ICD-10-CM

## 2013-03-30 DIAGNOSIS — R110 Nausea: Secondary | ICD-10-CM

## 2013-03-30 DIAGNOSIS — S72001D Fracture of unspecified part of neck of right femur, subsequent encounter for closed fracture with routine healing: Secondary | ICD-10-CM

## 2013-03-30 MED ORDER — MEPERIDINE HCL 50 MG PO TABS: 50.0000 mg | ORAL_TABLET | ORAL | Status: DC | PRN

## 2013-03-30 MED ORDER — PROMETHAZINE HCL 12.5 MG PO TABS: 12.5000 mg | ORAL_TABLET | Freq: Four times a day (QID) | ORAL | Status: DC | PRN

## 2013-03-30 NOTE — Progress Notes (Signed)
Patient ID: Ellen Anderson, female   DOB: 03/09/1946, 67 y.o.   MRN: [id number] Chief Complaint  Patient presents with  . Follow-up    Hospital follow up, Post op 1 right hip DOS 03/08/13    BP 104/74  Ht 5\' 2"  (1.575 m)  Wt 151 lb (68.493 kg)  BMI 27.61 kg/m2  Right hip gamma nail doing well ready to go home today wound clean mild erythema from staple staples taken out come back 4 weeks for x-ray  Start Demerol and Phenergan for pain and nausea

## 2013-04-03 ENCOUNTER — Ambulatory Visit: Payer: Medicare Other | Admitting: Orthopedic Surgery

## 2013-04-27 ENCOUNTER — Ambulatory Visit (INDEPENDENT_AMBULATORY_CARE_PROVIDER_SITE_OTHER): Payer: Medicare Other | Admitting: Orthopedic Surgery

## 2013-04-27 ENCOUNTER — Ambulatory Visit (INDEPENDENT_AMBULATORY_CARE_PROVIDER_SITE_OTHER): Payer: Medicare Other

## 2013-04-27 ENCOUNTER — Encounter: Payer: Self-pay | Admitting: Orthopedic Surgery

## 2013-04-27 VITALS — BP 124/62 | Ht 62.0 in | Wt 151.0 lb

## 2013-04-27 DIAGNOSIS — S72141D Displaced intertrochanteric fracture of right femur, subsequent encounter for closed fracture with routine healing: Secondary | ICD-10-CM

## 2013-04-27 DIAGNOSIS — S72009D Fracture of unspecified part of neck of unspecified femur, subsequent encounter for closed fracture with routine healing: Secondary | ICD-10-CM

## 2013-04-27 DIAGNOSIS — S72141A Displaced intertrochanteric fracture of right femur, initial encounter for closed fracture: Secondary | ICD-10-CM | POA: Insufficient documentation

## 2013-04-27 NOTE — Progress Notes (Signed)
Patient ID: Ellen Anderson, female   DOB: 07-Sep-1946, 67 y.o.   MRN: [id number] Chief Complaint  Patient presents with  . Follow-up    right hip follow up with x-ray/dos 03-08-13    Post op Gamma nail - 6 week xrays   So far doing reasonably well she says she doesn't think she needs any therapy she using her cane her x-ray looks good  Followup in 6 weeks for x-ray at postop week #12

## 2013-04-27 NOTE — Patient Instructions (Addendum)
Continue therapy

## 2013-05-30 ENCOUNTER — Ambulatory Visit (INDEPENDENT_AMBULATORY_CARE_PROVIDER_SITE_OTHER): Payer: Medicare Other

## 2013-05-30 ENCOUNTER — Ambulatory Visit (INDEPENDENT_AMBULATORY_CARE_PROVIDER_SITE_OTHER): Payer: Medicare Other | Admitting: Orthopedic Surgery

## 2013-05-30 ENCOUNTER — Encounter: Payer: Self-pay | Admitting: Orthopedic Surgery

## 2013-05-30 VITALS — BP 120/84 | Ht 62.0 in | Wt 153.0 lb

## 2013-05-30 DIAGNOSIS — M7072 Other bursitis of hip, left hip: Secondary | ICD-10-CM | POA: Insufficient documentation

## 2013-05-30 DIAGNOSIS — S72141D Displaced intertrochanteric fracture of right femur, subsequent encounter for closed fracture with routine healing: Secondary | ICD-10-CM

## 2013-05-30 DIAGNOSIS — M76899 Other specified enthesopathies of unspecified lower limb, excluding foot: Secondary | ICD-10-CM

## 2013-05-30 DIAGNOSIS — S72009D Fracture of unspecified part of neck of unspecified femur, subsequent encounter for closed fracture with routine healing: Secondary | ICD-10-CM

## 2013-05-30 MED ORDER — MEPERIDINE HCL 50 MG PO TABS: 50.0000 mg | ORAL_TABLET | ORAL | Status: DC | PRN

## 2013-05-30 NOTE — Patient Instructions (Signed)
Left hip bursitis   Right hip fracture

## 2013-05-30 NOTE — Progress Notes (Signed)
Patient ID: KYMORAH KORF, female   DOB: April 03, 1946, 67 y.o.   MRN: [id number]  Chief Complaint  Patient presents with  . Follow-up    right hip 5 week follow up with x-ray/dos 03-08-13     Patient history  The patient is here for her three-month followup related to her right hip fracture which was treated with a short gamma nail. She is left hip fracture treated last year with short gamma nail she is doing well.  She complains of pain over her left hip with a feeling that something is moving around the left hip area over the left greater trochanter. She's had it for several weeks. It's a dull pain. It's not associated with back pain numbness or tingling. It seems to be worse when she's walking or laying on her left side  She denies numbness tingling skin rash over the left hip no weakness in the left hip no groin pain    BP 120/84  Ht 5\' 2"  (1.575 m)  Wt 153 lb (69.4 kg)  BMI 27.98 kg/m2  She is awake alert and oriented x3 her mood and affect are normal, excellent grooming and hygiene.  She ambulates fairly well no assistive devices appears to transfer weight to her left side.  Her right hip incision is clean dry and intact. Her left hip shows no redness but she is tender over the greater trochanter is excellent hip flexion painful adduction of the hip, stability and strength are normal skin incision healed well pulses good sensation is normal  Imaging 2 views right hip gamma nail short fracture appears to be healing well tapered end of the nail abuts against the anterior femur with no evidence of sclerotic change or erosion  Impression right hip fracture healing well  Left hip bursitis  Inject left hip Left Hip Injection Verbal consent was obtained  Timeout procedure was completed  Alcohol was used as a prep with ethyl chloride as an anesthetizing agent   25-gauge needle was used to enter the greater trochanter bursa , this was then injected with Depo-Medrol 40 mg.  And 3 cc  of 1% lidocaine   Tolerated with no complications

## 2013-07-11 ENCOUNTER — Ambulatory Visit (INDEPENDENT_AMBULATORY_CARE_PROVIDER_SITE_OTHER): Payer: Medicare Other | Admitting: Orthopedic Surgery

## 2013-07-11 VITALS — BP 105/63 | Ht 62.0 in | Wt 153.0 lb

## 2013-07-11 DIAGNOSIS — S72009D Fracture of unspecified part of neck of unspecified femur, subsequent encounter for closed fracture with routine healing: Secondary | ICD-10-CM

## 2013-07-11 DIAGNOSIS — R110 Nausea: Secondary | ICD-10-CM

## 2013-07-11 DIAGNOSIS — S72141D Displaced intertrochanteric fracture of right femur, subsequent encounter for closed fracture with routine healing: Secondary | ICD-10-CM

## 2013-07-11 MED ORDER — PROMETHAZINE HCL 12.5 MG PO TABS: 12.5000 mg | ORAL_TABLET | Freq: Four times a day (QID) | ORAL | Status: DC | PRN

## 2013-07-11 MED ORDER — MEPERIDINE HCL 50 MG PO TABS: 50.0000 mg | ORAL_TABLET | ORAL | Status: DC | PRN

## 2013-07-11 NOTE — Patient Instructions (Addendum)
You have received a steroid shot. 15% of patients experience increased pain at the injection site with in the next 24 hours. This is best treated with ice and tylenol extra strength 2 tabs every 8 hours. If you are still having pain please call the office.    

## 2013-07-12 NOTE — Progress Notes (Signed)
Patient ID: Ellen Anderson, female   DOB: 1946/06/29, 66 y.o.   MRN: [id number] Chief Complaint  Patient presents with  . Follow-up    6 week recheck left hip bursitis response to medicine    BP 105/63  Ht 5\' 2"  (1.575 m)  Wt 153 lb (69.4 kg)  BMI 27.98 kg/m2  Continued pain of her left hip received injection for bursitis continues to have left hip pain  Status post Left hip bipolar and right hip gamma nail doing well on the right hip.  Review of systems negative for radicular symptoms or saddle anesthesia  BP 105/63  Ht 5\' 2"  (1.575 m)  Wt 153 lb (69.4 kg)  BMI 27.98 kg/m2  General appearance is normal, the patient is alert and oriented x3 with normal mood and affect. She still uses her cane to walk has a slight limp she has tenderness over the left greater trochanter exacerbated by adduction of the hip in flexion remains normal incision is clean dry and intact no signs of infection no erythema in the skin distal neurovascular function remains normal  She is tender at the most distal aspect of the incision  Impression bursitis left hip recommend injection left hip  Left Hip Injection Verbal consent was obtained  Timeout procedure was completed  Alcohol was used as a prep with ethyl chloride as an anesthetizing agent  Using a 22-gauge spinal needle the greater trochanter bursa was entered and injected with Depo-Medrol 40 mg.  And 3 cc of 1% lidocaine   Tolerated with no complications

## 2013-08-15 ENCOUNTER — Ambulatory Visit (INDEPENDENT_AMBULATORY_CARE_PROVIDER_SITE_OTHER): Payer: Medicare Other | Admitting: Orthopedic Surgery

## 2013-08-15 ENCOUNTER — Encounter: Payer: Self-pay | Admitting: Orthopedic Surgery

## 2013-08-15 VITALS — BP 114/71 | Ht 62.0 in | Wt 159.0 lb

## 2013-08-15 DIAGNOSIS — G894 Chronic pain syndrome: Secondary | ICD-10-CM | POA: Insufficient documentation

## 2013-08-15 DIAGNOSIS — M76899 Other specified enthesopathies of unspecified lower limb, excluding foot: Secondary | ICD-10-CM

## 2013-08-15 DIAGNOSIS — M7072 Other bursitis of hip, left hip: Secondary | ICD-10-CM

## 2013-08-15 MED ORDER — MEPERIDINE-PROMETHAZINE 50-25 MG PO CAPS: 1.0000 | ORAL_CAPSULE | ORAL | Status: DC | PRN

## 2013-08-15 NOTE — Patient Instructions (Signed)
Trochanteric Bursitis You have hip pain due to trochanteric bursitis. Bursitis means that the sack near the outside of the hip is filled with fluid and inflamed. This sack is made up of protective soft tissue. The pain from trochanteric bursitis can be severe and keep you from sleep. It can radiate to the buttocks or down the outside of the thigh to the knee. The pain is almost always worse when rising from the seated or lying position and with walking. Pain can improve after you take a few steps. It happens more often in people with hip joint and lumbar spine problems, such as arthritis or previous surgery. Very rarely the trochanteric bursa can become infected, and antibiotics and/or surgery may be needed. Treatment often includes an injection of local anesthetic mixed with cortisone medicine. This medicine is injected into the area where it is most tender over the hip. Repeat injections may be necessary if the response to treatment is slow. You can apply ice packs over the tender area for 30 minutes every 2 hours for the next few days. Anti-inflammatory and/or narcotic pain medicine may also be helpful. Limit your activity for the next few days if the pain continues. See your caregiver in 5-10 days if you are not greatly improved.

## 2013-08-15 NOTE — Progress Notes (Signed)
Patient ID: Ellen Anderson, female   DOB: 1946-08-14, 67 y.o.   MRN: [id number]  Chief Complaint  Patient presents with  . Follow-up    1 month recheck left hip s/p injection    BP 114/71  Ht 5\' 2"  (1.575 m)  Wt 159 lb (72.122 kg)  BMI 29.07 kg/m2  Encounter Diagnoses  Name Primary?  . Chronic pain syndrome Yes  . Bursitis of left hip     Persistent pain over the left greater trochanter at the proximal portion of the hip midportion of the previous hip incision for gamma nail  She would like another injection  Procedure note injection left hip Diagnosis hip pain bursitis Medications Depo-Medrol 40 mg, lidocaine 1% 3 cc Timeout verbal consent confirm site as left hip  Alcohol and ethyl chloride used to prep the skin injection given with spinal needle no complications

## 2015-02-12 NOTE — Patient Instructions (Signed)
Your procedure is scheduled on: 02/18/2015  Report to [facility] at  76  AM.  Call this number if you have problems the morning of surgery: [phone]   Do not eat food or drink liquids :After Midnight.      Take these medicines the morning of surgery with A SIP OF WATER: valium   Do not wear jewelry, make-up or nail polish.  Do not wear lotions, powders, or perfumes.   Do not shave 48 hours prior to surgery.  Do not bring valuables to the hospital.  Contacts, dentures or bridgework may not be worn into surgery.  Leave suitcase in the car. After surgery it may be brought to your room.  For patients admitted to the hospital, checkout time is 11:00 AM the day of discharge.   Patients discharged the day of surgery will not be allowed to drive home.  :     Please read over the following fact sheets that you were given: Coughing and Deep Breathing, Surgical Site Infection Prevention, Anesthesia Post-op Instructions and Care and Recovery After Surgery    Cataract A cataract is a clouding of the lens of the eye. When a lens becomes cloudy, vision is reduced based on the degree and nature of the clouding. Many cataracts reduce vision to some degree. Some cataracts make people more near-sighted as they develop. Other cataracts increase glare. Cataracts that are ignored and become worse can sometimes look white. The white color can be seen through the pupil. CAUSES   Aging. However, cataracts may occur at any age, even in newborns.   Certain drugs.   Trauma to the eye.   Certain diseases such as diabetes.   Specific eye diseases such as chronic inflammation inside the eye or a sudden attack of a rare form of glaucoma.   Inherited or acquired medical problems.  SYMPTOMS   Gradual, progressive drop in vision in the affected eye.   Severe, rapid visual loss. This most often happens when trauma is the cause.  DIAGNOSIS  To detect a cataract, an eye doctor examines the lens. Cataracts are  best diagnosed with an exam of the eyes with the pupils enlarged (dilated) by drops.  TREATMENT  For an early cataract, vision may improve by using different eyeglasses or stronger lighting. If that does not help your vision, surgery is the only effective treatment. A cataract needs to be surgically removed when vision loss interferes with your everyday activities, such as driving, reading, or watching TV. A cataract may also have to be removed if it prevents examination or treatment of another eye problem. Surgery removes the cloudy lens and usually replaces it with a substitute lens (intraocular lens, IOL).  At a time when both you and your doctor agree, the cataract will be surgically removed. If you have cataracts in both eyes, only one is usually removed at a time. This allows the operated eye to heal and be out of danger from any possible problems after surgery (such as infection or poor wound healing). In rare cases, a cataract may be doing damage to your eye. In these cases, your caregiver may advise surgical removal right away. The vast majority of people who have cataract surgery have better vision afterward. HOME CARE INSTRUCTIONS  If you are not planning surgery, you may be asked to do the following:  Use different eyeglasses.   Use stronger or brighter lighting.   Ask your eye doctor about reducing your medicine dose or changing medicines if  it is thought that a medicine caused your cataract. Changing medicines does not make the cataract go away on its own.   Become familiar with your surroundings. Poor vision can lead to injury. Avoid bumping into things on the affected side. You are at a higher risk for tripping or falling.   Exercise extreme care when driving or operating machinery.   Wear sunglasses if you are sensitive to bright light or experiencing problems with glare.  SEEK IMMEDIATE MEDICAL CARE IF:   You have a worsening or sudden vision loss.   You notice redness,  swelling, or increasing pain in the eye.   You have a fever.  Document Released: 11/23/2005 Document Revised: 11/12/2011 Document Reviewed: 07/17/2011 [facility] Patient Information 2012 [place].PATIENT INSTRUCTIONS POST-ANESTHESIA  IMMEDIATELY FOLLOWING SURGERY:  Do not drive or operate machinery for the first twenty four hours after surgery.  Do not make any important decisions for twenty four hours after surgery or while taking narcotic pain medications or sedatives.  If you develop intractable nausea and vomiting or a severe headache please notify your doctor immediately.  FOLLOW-UP:  Please make an appointment with your surgeon as instructed. You do not need to follow up with anesthesia unless specifically instructed to do so.  WOUND CARE INSTRUCTIONS (if applicable):  Keep a dry clean dressing on the anesthesia/puncture wound site if there is drainage.  Once the wound has quit draining you may leave it open to air.  Generally you should leave the bandage intact for twenty four hours unless there is drainage.  If the epidural site drains for more than 36-48 hours please call the anesthesia department.  QUESTIONS?:  Please feel free to call your physician or the hospital operator if you have any questions, and they will be happy to assist you.

## 2015-02-13 ENCOUNTER — Encounter (HOSPITAL_COMMUNITY): Payer: Self-pay

## 2015-02-13 ENCOUNTER — Other Ambulatory Visit: Payer: Self-pay

## 2015-02-13 ENCOUNTER — Encounter (HOSPITAL_COMMUNITY)
Admission: RE | Admit: 2015-02-13 | Discharge: 2015-02-13 | Disposition: A | Discharge status: Home / Self Care | Payer: Medicare Other | Source: Ambulatory Visit | Attending: Ophthalmology | Admitting: Ophthalmology

## 2015-02-13 DIAGNOSIS — Z01812 Encounter for preprocedural laboratory examination: Secondary | ICD-10-CM | POA: Insufficient documentation

## 2015-02-13 DIAGNOSIS — Z01810 Encounter for preprocedural cardiovascular examination: Secondary | ICD-10-CM | POA: Diagnosis not present

## 2015-02-13 LAB — CBC
HCT: 39 % (ref 36.0–46.0)
Hemoglobin: 12.9 g/dL (ref 12.0–15.0)
MCH: 29.9 pg (ref 26.0–34.0)
MCHC: 33.1 g/dL (ref 30.0–36.0)
MCV: 90.3 fL (ref 78.0–100.0)
Platelets: 252 10*3/uL (ref 150–400)
RBC: 4.32 MIL/uL (ref 3.87–5.11)
RDW: 14 % (ref 11.5–15.5)
WBC: 5 10*3/uL (ref 4.0–10.5)

## 2015-02-13 LAB — BASIC METABOLIC PANEL
Anion gap: 8 (ref 5–15)
BUN: 10 mg/dL (ref 6–23)
CALCIUM: 8.9 mg/dL (ref 8.4–10.5)
CO2: 24 mmol/L (ref 19–32)
Chloride: 109 mmol/L (ref 96–112)
Creatinine, Ser: 0.68 mg/dL (ref 0.50–1.10)
GFR calc Af Amer: >90 mL/min (ref >90)
GFR calc non Af Amer: 87 mL/min — ABNORMAL LOW (ref >90)
Glucose, Bld: 75 mg/dL (ref 70–99)
Potassium: 3.3 mmol/L — ABNORMAL LOW (ref 3.5–5.1)
SODIUM: 141 mmol/L (ref 135–145)

## 2015-02-13 NOTE — Pre-Procedure Instructions (Signed)
Patient given information to sign up for my chart at home. 

## 2015-02-15 MED ORDER — CYCLOPENTOLATE-PHENYLEPHRINE OP SOLN OPTIME - NO CHARGE
OPHTHALMIC | Status: AC
  Filled 2015-02-15: qty 2

## 2015-02-15 MED ORDER — TETRACAINE HCL 0.5 % OP SOLN
OPHTHALMIC | Status: AC
  Filled 2015-02-15: qty 2

## 2015-02-15 MED ORDER — PHENYLEPHRINE HCL 2.5 % OP SOLN
OPHTHALMIC | Status: AC
  Filled 2015-02-15: qty 15

## 2015-02-15 MED ORDER — LIDOCAINE HCL (PF) 1 % IJ SOLN
INTRAMUSCULAR | Status: AC
  Filled 2015-02-15: qty 2

## 2015-02-15 MED ORDER — NEOMYCIN-POLYMYXIN-DEXAMETH 3.5-10000-0.1 OP SUSP
OPHTHALMIC | Status: AC
  Filled 2015-02-15: qty 5

## 2015-02-15 MED ORDER — LIDOCAINE HCL 3.5 % OP GEL
OPHTHALMIC | Status: AC
  Filled 2015-02-15: qty 1

## 2015-02-18 ENCOUNTER — Ambulatory Visit (HOSPITAL_COMMUNITY): Payer: Medicare Other | Admitting: Anesthesiology

## 2015-02-18 ENCOUNTER — Ambulatory Visit (HOSPITAL_COMMUNITY)
Admission: RE | Admit: 2015-02-18 | Discharge: 2015-02-18 | Disposition: A | Discharge status: Home / Self Care | Payer: Medicare Other | Source: Ambulatory Visit | Attending: Ophthalmology | Admitting: Ophthalmology

## 2015-02-18 ENCOUNTER — Encounter (HOSPITAL_COMMUNITY)
Admission: RE | Disposition: A | Discharge status: Home / Self Care | Payer: Self-pay | Source: Ambulatory Visit | Attending: Ophthalmology

## 2015-02-18 ENCOUNTER — Encounter (HOSPITAL_COMMUNITY): Payer: Self-pay | Admitting: Ophthalmology

## 2015-02-18 DIAGNOSIS — F419 Anxiety disorder, unspecified: Secondary | ICD-10-CM | POA: Diagnosis not present

## 2015-02-18 DIAGNOSIS — I10 Essential (primary) hypertension: Secondary | ICD-10-CM | POA: Insufficient documentation

## 2015-02-18 DIAGNOSIS — Z7982 Long term (current) use of aspirin: Secondary | ICD-10-CM | POA: Diagnosis not present

## 2015-02-18 DIAGNOSIS — H25812 Combined forms of age-related cataract, left eye: Secondary | ICD-10-CM | POA: Diagnosis not present

## 2015-02-18 DIAGNOSIS — M1990 Unspecified osteoarthritis, unspecified site: Secondary | ICD-10-CM | POA: Insufficient documentation

## 2015-02-18 DIAGNOSIS — Z79899 Other long term (current) drug therapy: Secondary | ICD-10-CM | POA: Diagnosis not present

## 2015-02-18 DIAGNOSIS — F329 Major depressive disorder, single episode, unspecified: Secondary | ICD-10-CM | POA: Diagnosis not present

## 2015-02-18 DIAGNOSIS — H269 Unspecified cataract: Secondary | ICD-10-CM | POA: Diagnosis present

## 2015-02-18 HISTORY — PX: CATARACT EXTRACTION W/PHACO: SHX586

## 2015-02-18 SURGERY — PHACOEMULSIFICATION, CATARACT, WITH IOL INSERTION
Anesthesia: Monitor Anesthesia Care | Site: Eye | Laterality: Right

## 2015-02-18 MED ORDER — MIDAZOLAM HCL 2 MG/2ML IJ SOLN
1.0000 mg | INTRAMUSCULAR | Status: DC | PRN
  Administered 2015-02-18: 2 mg via INTRAVENOUS

## 2015-02-18 MED ORDER — EPINEPHRINE HCL 1 MG/ML IJ SOLN
INTRAMUSCULAR | Status: DC | PRN
  Administered 2015-02-18: 500 mL

## 2015-02-18 MED ORDER — LIDOCAINE HCL (PF) 1 % IJ SOLN
INTRAMUSCULAR | Status: DC | PRN
  Administered 2015-02-18: .4 mL

## 2015-02-18 MED ORDER — LIDOCAINE 3.5 % OP GEL OPTIME - NO CHARGE
OPHTHALMIC | Status: DC | PRN
  Administered 2015-02-18: 1 [drp] via OPHTHALMIC

## 2015-02-18 MED ORDER — TETRACAINE HCL 0.5 % OP SOLN
1.0000 [drp] | OPHTHALMIC | Status: AC
  Administered 2015-02-18 (×3): 1 [drp] via OPHTHALMIC

## 2015-02-18 MED ORDER — LACTATED RINGERS IV SOLN
INTRAVENOUS | Status: DC
  Administered 2015-02-18: 1000 mL via INTRAVENOUS

## 2015-02-18 MED ORDER — LIDOCAINE HCL 3.5 % OP GEL
1.0000 "application " | Freq: Once | OPHTHALMIC | Status: AC
  Administered 2015-02-18: 1 via OPHTHALMIC

## 2015-02-18 MED ORDER — NEOMYCIN-POLYMYXIN-DEXAMETH 3.5-10000-0.1 OP SUSP
OPHTHALMIC | Status: DC | PRN
  Administered 2015-02-18: 1 [drp] via OPHTHALMIC

## 2015-02-18 MED ORDER — BSS IO SOLN
INTRAOCULAR | Status: DC | PRN
  Administered 2015-02-18: 15 mL via INTRAOCULAR

## 2015-02-18 MED ORDER — PHENYLEPHRINE HCL 2.5 % OP SOLN
1.0000 [drp] | OPHTHALMIC | Status: AC
  Administered 2015-02-18 (×3): 1 [drp] via OPHTHALMIC

## 2015-02-18 MED ORDER — FENTANYL CITRATE 0.05 MG/ML IJ SOLN
INTRAMUSCULAR | Status: AC
  Filled 2015-02-18: qty 2

## 2015-02-18 MED ORDER — FENTANYL CITRATE 0.05 MG/ML IJ SOLN
25.0000 ug | INTRAMUSCULAR | Status: AC
  Administered 2015-02-18: 25 ug via INTRAVENOUS

## 2015-02-18 MED ORDER — CYCLOPENTOLATE-PHENYLEPHRINE 0.2-1 % OP SOLN
1.0000 [drp] | OPHTHALMIC | Status: AC
  Administered 2015-02-18 (×3): 1 [drp] via OPHTHALMIC

## 2015-02-18 MED ORDER — MIDAZOLAM HCL 2 MG/2ML IJ SOLN
INTRAMUSCULAR | Status: AC
  Filled 2015-02-18: qty 2

## 2015-02-18 MED ORDER — PROVISC 10 MG/ML IO SOLN
INTRAOCULAR | Status: DC | PRN
  Administered 2015-02-18: 0.85 mL via INTRAOCULAR

## 2015-02-18 SURGICAL SUPPLY — 34 items
CAPSULAR TENSION RING-AMO (OPHTHALMIC RELATED) IMPLANT
CLOTH BEACON ORANGE TIMEOUT ST (SAFETY) ×3 IMPLANT
EYE SHIELD UNIVERSAL CLEAR (GAUZE/BANDAGES/DRESSINGS) ×3 IMPLANT
GLOVE BIO SURGEON STRL SZ 6.5 (GLOVE) IMPLANT
GLOVE BIO SURGEONS STRL SZ 6.5 (GLOVE)
GLOVE BIOGEL PI IND STRL 6.5 (GLOVE) ×1 IMPLANT
GLOVE BIOGEL PI IND STRL 7.0 (GLOVE) IMPLANT
GLOVE BIOGEL PI IND STRL 7.5 (GLOVE) IMPLANT
GLOVE BIOGEL PI INDICATOR 6.5 (GLOVE) ×2
GLOVE BIOGEL PI INDICATOR 7.0 (GLOVE)
GLOVE BIOGEL PI INDICATOR 7.5 (GLOVE)
GLOVE ECLIPSE 6.5 STRL STRAW (GLOVE) IMPLANT
GLOVE ECLIPSE 7.0 STRL STRAW (GLOVE) IMPLANT
GLOVE ECLIPSE 7.5 STRL STRAW (GLOVE) IMPLANT
GLOVE EXAM NITRILE LRG STRL (GLOVE) ×3 IMPLANT
GLOVE EXAM NITRILE MD LF STRL (GLOVE) IMPLANT
GLOVE SKINSENSE NS SZ6.5 (GLOVE)
GLOVE SKINSENSE NS SZ7.0 (GLOVE)
GLOVE SKINSENSE STRL SZ6.5 (GLOVE) IMPLANT
GLOVE SKINSENSE STRL SZ7.0 (GLOVE) IMPLANT
KIT VITRECTOMY (OPHTHALMIC RELATED) IMPLANT
PAD ARMBOARD 7.5X6 YLW CONV (MISCELLANEOUS) ×3 IMPLANT
PROC W NO LENS (INTRAOCULAR LENS)
PROC W SPEC LENS (INTRAOCULAR LENS)
PROCESS W NO LENS (INTRAOCULAR LENS) IMPLANT
PROCESS W SPEC LENS (INTRAOCULAR LENS) IMPLANT
RETRACTOR IRIS SIGHTPATH (OPHTHALMIC RELATED) IMPLANT
RING MALYGIN (MISCELLANEOUS) IMPLANT
SIGHTPATH CAT PROC W REG LENS (Ophthalmic Related) ×3 IMPLANT
SYRINGE LUER LOK 1CC (MISCELLANEOUS) ×3 IMPLANT
TAPE SURG TRANSPORE 1 IN (GAUZE/BANDAGES/DRESSINGS) ×1 IMPLANT
TAPE SURGICAL TRANSPORE 1 IN (GAUZE/BANDAGES/DRESSINGS) ×2
VISCOELASTIC ADDITIONAL (OPHTHALMIC RELATED) IMPLANT
WATER STERILE IRR 250ML POUR (IV SOLUTION) ×3 IMPLANT

## 2015-02-18 NOTE — Op Note (Signed)
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  Assistants: None  Anesthesia: Topical with MAC  Indications: Painless, progressive loss of vision with compromise of daily activities.  Surgery: Cataract Extraction with Intraocular lens Implant Right Eye  Discription: The patient had dilating drops and viscous lidocaine placed into the Right eye in the pre-op holding area. After transfer to the operating room, a time out was performed. The patient was then prepped and draped. Beginning with a 23 degree blade a paracentesis port was made at the surgeon's 2 o'clock position. The anterior chamber was then filled with 1% non-preserved lidocaine. This was followed by filling the anterior chamber with Provisc.  A 2.54mm keratome blade was used to make a clear corneal incision at the temporal limbus.  A bent cystatome needle was used to create a continuous tear capsulotomy. Hydrodissection was performed with balanced salt solution on a Fine canula. The lens nucleus was then removed using the phacoemulsification handpiece. Residual cortex was removed with the I&A handpiece. The anterior chamber and capsular bag were refilled with Provisc. A posterior chamber intraocular lens was placed into the capsular bag with it's injector. The implant was positioned with the Kuglan hook. The Provisc was then removed from the anterior chamber and capsular bag with the I&A handpiece. Stromal hydration of the main incision and paracentesis port was performed with BSS on a Fine canula. The wounds were tested for leak which was negative. The patient tolerated the procedure well. There were no operative complications. The patient was then transferred to the recovery room in stable condition.  Complications: None  Specimen: None  EBL: None  Prosthetic device: Hoya iSert 250, power 20.0  D, SN [id number].

## 2015-02-18 NOTE — Anesthesia Postprocedure Evaluation (Signed)
  Anesthesia Post-op Note  Patient: Ellen Anderson  Procedure(s) Performed: Procedure(s): CATARACT EXTRACTION PHACO AND INTRAOCULAR LENS PLACEMENT RIGHT EYE; CDE:  10.48 (Right)  Patient Location: Short Stay  Anesthesia Type:MAC  Level of Consciousness: awake, alert , oriented and patient cooperative  Airway and Oxygen Therapy: Patient Spontanous Breathing  Post-op Pain: none  Post-op Assessment: Post-op Vital signs reviewed, Patient's Cardiovascular Status Stable, Respiratory Function Stable, Patent Airway, No signs of Nausea or vomiting and Pain level controlled  Post-op Vital Signs: Reviewed and stable  Last Vitals:  Filed Vitals:   02/18/15 0850  BP: 114/78  Temp:   Resp: 23    Complications: No apparent anesthesia complications

## 2015-02-18 NOTE — Transfer of Care (Signed)
Immediate Anesthesia Transfer of Care Note  Patient: Ellen Anderson  Procedure(s) Performed: Procedure(s): CATARACT EXTRACTION PHACO AND INTRAOCULAR LENS PLACEMENT RIGHT EYE; CDE:  10.48 (Right)  Patient Location: Short Stay  Anesthesia Type:MAC  Level of Consciousness: awake, alert , oriented and patient cooperative  Airway & Oxygen Therapy: Patient Spontanous Breathing  Post-op Assessment: Report given to RN, Post -op Vital signs reviewed and stable and Patient moving all extremities  Post vital signs: Reviewed and stable  Last Vitals:  Filed Vitals:   02/18/15 0850  BP: 114/78  Temp:   Resp: 23    Complications: No apparent anesthesia complications

## 2015-02-18 NOTE — Anesthesia Preprocedure Evaluation (Signed)
Anesthesia Evaluation  Patient identified by MRN, date of birth, ID band Patient awake    Reviewed: Allergy & Precautions, H&P , NPO status   History of Anesthesia Complications Negative for: history of anesthetic complications  Airway Mallampati: II  TM Distance: >3 FB Neck ROM: Full    Dental  (+) Edentulous Upper, Edentulous Lower   Pulmonary neg pulmonary ROS,  breath sounds clear to auscultation        Cardiovascular hypertension, Pt. on medications Rhythm:Regular Rate:Normal     Neuro/Psych Seizures -, Well Controlled,     GI/Hepatic negative GI ROS,   Endo/Other    Renal/GU      Musculoskeletal Chronic LBP, muscle spasms.   Abdominal   Peds  Hematology  (+) anemia ,   Anesthesia Other Findings   Reproductive/Obstetrics                             Anesthesia Physical Anesthesia Plan  ASA: III  Anesthesia Plan: MAC   Post-op Pain Management:    Induction: Intravenous  Airway Management Planned: Nasal Cannula  Additional Equipment:   Intra-op Plan:   Post-operative Plan:   Informed Consent: I have reviewed the patients History and Physical, chart, labs and discussed the procedure including the risks, benefits and alternatives for the proposed anesthesia with the patient or authorized representative who has indicated his/her understanding and acceptance.     Plan Discussed with:   Anesthesia Plan Comments:         Anesthesia Quick Evaluation

## 2015-02-18 NOTE — H&P (Signed)
I have reviewed the H&P, the patient was re-examined, and I have identified no interval changes in medical condition and plan of care since the history and physical of record  

## 2015-02-18 NOTE — Discharge Instructions (Signed)

## 2015-02-20 ENCOUNTER — Encounter (HOSPITAL_COMMUNITY): Payer: Self-pay | Admitting: Ophthalmology

## 2015-03-04 ENCOUNTER — Encounter (HOSPITAL_COMMUNITY)
Admission: RE | Admit: 2015-03-04 | Discharge: 2015-03-04 | Disposition: A | Discharge status: Home / Self Care | Payer: Medicare Other | Source: Ambulatory Visit | Attending: Ophthalmology | Admitting: Ophthalmology

## 2015-03-06 MED ORDER — CYCLOPENTOLATE-PHENYLEPHRINE OP SOLN OPTIME - NO CHARGE
OPHTHALMIC | Status: AC
  Filled 2015-03-06: qty 2

## 2015-03-06 MED ORDER — NEOMYCIN-POLYMYXIN-DEXAMETH 3.5-10000-0.1 OP SUSP
OPHTHALMIC | Status: AC
  Filled 2015-03-06: qty 5

## 2015-03-06 MED ORDER — TETRACAINE HCL 0.5 % OP SOLN
OPHTHALMIC | Status: AC
  Filled 2015-03-06: qty 2

## 2015-03-06 MED ORDER — LIDOCAINE HCL (PF) 1 % IJ SOLN
INTRAMUSCULAR | Status: AC
  Filled 2015-03-06: qty 2

## 2015-03-06 MED ORDER — LIDOCAINE HCL 3.5 % OP GEL
OPHTHALMIC | Status: AC
  Filled 2015-03-06: qty 1

## 2015-03-06 MED ORDER — PHENYLEPHRINE HCL 2.5 % OP SOLN
OPHTHALMIC | Status: AC
  Filled 2015-03-06: qty 15

## 2015-03-07 ENCOUNTER — Ambulatory Visit (HOSPITAL_COMMUNITY)
Admission: RE | Admit: 2015-03-07 | Discharge: 2015-03-07 | Disposition: A | Discharge status: Home / Self Care | Payer: Medicare Other | Source: Ambulatory Visit | Attending: Ophthalmology | Admitting: Ophthalmology

## 2015-03-07 ENCOUNTER — Ambulatory Visit (HOSPITAL_COMMUNITY): Payer: Medicare Other | Admitting: Anesthesiology

## 2015-03-07 ENCOUNTER — Encounter (HOSPITAL_COMMUNITY)
Admission: RE | Disposition: A | Discharge status: Home / Self Care | Payer: Self-pay | Source: Ambulatory Visit | Attending: Ophthalmology

## 2015-03-07 ENCOUNTER — Encounter (HOSPITAL_COMMUNITY): Payer: Self-pay | Admitting: *Deleted

## 2015-03-07 DIAGNOSIS — H25812 Combined forms of age-related cataract, left eye: Secondary | ICD-10-CM | POA: Diagnosis present

## 2015-03-07 HISTORY — PX: CATARACT EXTRACTION W/PHACO: SHX586

## 2015-03-07 SURGERY — PHACOEMULSIFICATION, CATARACT, WITH IOL INSERTION
Anesthesia: Monitor Anesthesia Care | Site: Eye | Laterality: Left

## 2015-03-07 MED ORDER — FENTANYL CITRATE 0.05 MG/ML IJ SOLN
25.0000 ug | INTRAMUSCULAR | Status: AC
  Administered 2015-03-07 (×2): 25 ug via INTRAVENOUS

## 2015-03-07 MED ORDER — LIDOCAINE HCL 3.5 % OP GEL
1.0000 "application " | Freq: Once | OPHTHALMIC | Status: AC
  Administered 2015-03-07: 1 via OPHTHALMIC

## 2015-03-07 MED ORDER — CYCLOPENTOLATE-PHENYLEPHRINE 0.2-1 % OP SOLN
1.0000 [drp] | OPHTHALMIC | Status: AC
  Administered 2015-03-07 (×3): 1 [drp] via OPHTHALMIC

## 2015-03-07 MED ORDER — FENTANYL CITRATE 0.05 MG/ML IJ SOLN
INTRAMUSCULAR | Status: DC | PRN
  Administered 2015-03-07 (×2): 25 ug via INTRAVENOUS

## 2015-03-07 MED ORDER — FENTANYL CITRATE 0.05 MG/ML IJ SOLN
INTRAMUSCULAR | Status: AC
  Filled 2015-03-07: qty 2

## 2015-03-07 MED ORDER — BSS IO SOLN
INTRAOCULAR | Status: DC | PRN
  Administered 2015-03-07: 15 mL via INTRAOCULAR

## 2015-03-07 MED ORDER — LIDOCAINE 3.5 % OP GEL OPTIME - NO CHARGE
OPHTHALMIC | Status: DC | PRN
  Administered 2015-03-07: 1 [drp] via OPHTHALMIC

## 2015-03-07 MED ORDER — NEOMYCIN-POLYMYXIN-DEXAMETH 3.5-10000-0.1 OP SUSP
OPHTHALMIC | Status: DC | PRN
  Administered 2015-03-07: 1 [drp] via OPHTHALMIC

## 2015-03-07 MED ORDER — EPINEPHRINE HCL 1 MG/ML IJ SOLN
INTRAMUSCULAR | Status: AC
  Filled 2015-03-07: qty 1

## 2015-03-07 MED ORDER — EPINEPHRINE HCL 1 MG/ML IJ SOLN
INTRAOCULAR | Status: DC | PRN
  Administered 2015-03-07: 12:00:00

## 2015-03-07 MED ORDER — MIDAZOLAM HCL 2 MG/2ML IJ SOLN
1.0000 mg | INTRAMUSCULAR | Status: DC | PRN
  Administered 2015-03-07: 2 mg via INTRAVENOUS

## 2015-03-07 MED ORDER — MIDAZOLAM HCL 2 MG/2ML IJ SOLN
INTRAMUSCULAR | Status: AC
  Filled 2015-03-07: qty 2

## 2015-03-07 MED ORDER — PROVISC 10 MG/ML IO SOLN
INTRAOCULAR | Status: DC | PRN
  Administered 2015-03-07: 0.85 mL via INTRAOCULAR

## 2015-03-07 MED ORDER — TETRACAINE HCL 0.5 % OP SOLN
1.0000 [drp] | OPHTHALMIC | Status: AC
  Administered 2015-03-07 (×3): 1 [drp] via OPHTHALMIC

## 2015-03-07 MED ORDER — LACTATED RINGERS IV SOLN
INTRAVENOUS | Status: DC
  Administered 2015-03-07: 1000 mL via INTRAVENOUS

## 2015-03-07 MED ORDER — PHENYLEPHRINE HCL 2.5 % OP SOLN
1.0000 [drp] | OPHTHALMIC | Status: AC
  Administered 2015-03-07 (×3): 1 [drp] via OPHTHALMIC

## 2015-03-07 SURGICAL SUPPLY — 34 items
CAPSULAR TENSION RING-AMO (OPHTHALMIC RELATED) IMPLANT
CLOTH BEACON ORANGE TIMEOUT ST (SAFETY) ×3 IMPLANT
EYE SHIELD UNIVERSAL CLEAR (GAUZE/BANDAGES/DRESSINGS) ×3 IMPLANT
GLOVE BIO SURGEON STRL SZ 6.5 (GLOVE) IMPLANT
GLOVE BIO SURGEONS STRL SZ 6.5 (GLOVE)
GLOVE BIOGEL PI IND STRL 6.5 (GLOVE) IMPLANT
GLOVE BIOGEL PI IND STRL 7.0 (GLOVE) ×1 IMPLANT
GLOVE BIOGEL PI IND STRL 7.5 (GLOVE) IMPLANT
GLOVE BIOGEL PI INDICATOR 6.5 (GLOVE)
GLOVE BIOGEL PI INDICATOR 7.0 (GLOVE) ×2
GLOVE BIOGEL PI INDICATOR 7.5 (GLOVE)
GLOVE ECLIPSE 6.5 STRL STRAW (GLOVE) IMPLANT
GLOVE ECLIPSE 7.0 STRL STRAW (GLOVE) IMPLANT
GLOVE ECLIPSE 7.5 STRL STRAW (GLOVE) ×3 IMPLANT
GLOVE EXAM NITRILE LRG STRL (GLOVE) IMPLANT
GLOVE EXAM NITRILE MD LF STRL (GLOVE) IMPLANT
GLOVE SKINSENSE NS SZ6.5 (GLOVE)
GLOVE SKINSENSE NS SZ7.0 (GLOVE)
GLOVE SKINSENSE STRL SZ6.5 (GLOVE) IMPLANT
GLOVE SKINSENSE STRL SZ7.0 (GLOVE) IMPLANT
KIT VITRECTOMY (OPHTHALMIC RELATED) IMPLANT
PAD ARMBOARD 7.5X6 YLW CONV (MISCELLANEOUS) ×3 IMPLANT
PROC W NO LENS (INTRAOCULAR LENS)
PROC W SPEC LENS (INTRAOCULAR LENS)
PROCESS W NO LENS (INTRAOCULAR LENS) IMPLANT
PROCESS W SPEC LENS (INTRAOCULAR LENS) IMPLANT
RETRACTOR IRIS SIGHTPATH (OPHTHALMIC RELATED) IMPLANT
RING MALYGIN (MISCELLANEOUS) IMPLANT
SIGHTPATH CAT PROC W REG LENS (Ophthalmic Related) ×3 IMPLANT
SYRINGE LUER LOK 1CC (MISCELLANEOUS) ×3 IMPLANT
TAPE SURG TRANSPARENT 2IN (GAUZE/BANDAGES/DRESSINGS) ×1 IMPLANT
TAPE TRANSPARENT 2IN (GAUZE/BANDAGES/DRESSINGS) ×2
VISCOELASTIC ADDITIONAL (OPHTHALMIC RELATED) IMPLANT
WATER STERILE IRR 250ML POUR (IV SOLUTION) ×3 IMPLANT

## 2015-03-07 NOTE — Discharge Instructions (Signed)

## 2015-03-07 NOTE — H&P (Signed)
I have reviewed the H&P, the patient was re-examined, and I have identified no interval changes in medical condition and plan of care since the history and physical of record  

## 2015-03-07 NOTE — Transfer of Care (Signed)
Immediate Anesthesia Transfer of Care Note  Patient: Ellen Anderson  Procedure(s) Performed: Procedure(s) (LRB): CATARACT EXTRACTION PHACO AND INTRAOCULAR LENS PLACEMENT (IOC) (Left)  Patient Location: Shortstay  Anesthesia Type: MAC  Level of Consciousness: awake  Airway & Oxygen Therapy: Patient Spontanous Breathing   Post-op Assessment: Report given to PACU RN, Post -op Vital signs reviewed and stable and Patient moving all extremities  Post vital signs: Reviewed and stable  Complications: No apparent anesthesia complications

## 2015-03-07 NOTE — Anesthesia Postprocedure Evaluation (Signed)
  Anesthesia Post-op Note  Patient: Ellen Anderson  Procedure(s) Performed: Procedure(s) (LRB): CATARACT EXTRACTION PHACO AND INTRAOCULAR LENS PLACEMENT (IOC) (Left)  Patient Location:  Short Stay  Anesthesia Type: MAC  Level of Consciousness: awake  Airway and Oxygen Therapy: Patient Spontanous Breathing  Post-op Pain: none  Post-op Assessment: Post-op Vital signs reviewed, Patient's Cardiovascular Status Stable, Respiratory Function Stable, Patent Airway, No signs of Nausea or vomiting and Pain level controlled  Post-op Vital Signs: Reviewed and stable  Complications: No apparent anesthesia complications

## 2015-03-07 NOTE — Anesthesia Preprocedure Evaluation (Signed)
Anesthesia Evaluation  Patient identified by MRN, date of birth, ID band Patient awake    Reviewed: Allergy & Precautions, H&P , NPO status   History of Anesthesia Complications Negative for: history of anesthetic complications  Airway Mallampati: II  TM Distance: >3 FB Neck ROM: Full    Dental  (+) Edentulous Upper, Edentulous Lower   Pulmonary neg pulmonary ROS,  breath sounds clear to auscultation        Cardiovascular hypertension, Pt. on medications Rhythm:Regular Rate:Normal     Neuro/Psych Seizures -, Well Controlled,     GI/Hepatic negative GI ROS,   Endo/Other    Renal/GU      Musculoskeletal Chronic LBP, muscle spasms.   Abdominal   Peds  Hematology  (+) anemia ,   Anesthesia Other Findings   Reproductive/Obstetrics                             Anesthesia Physical Anesthesia Plan  ASA: III  Anesthesia Plan: MAC   Post-op Pain Management:    Induction: Intravenous  Airway Management Planned: Nasal Cannula  Additional Equipment:   Intra-op Plan:   Post-operative Plan:   Informed Consent: I have reviewed the patients History and Physical, chart, labs and discussed the procedure including the risks, benefits and alternatives for the proposed anesthesia with the patient or authorized representative who has indicated his/her understanding and acceptance.     Plan Discussed with:   Anesthesia Plan Comments:         Anesthesia Quick Evaluation

## 2015-03-07 NOTE — Op Note (Signed)
Date of Admission: 03/07/2015  Date of Surgery: 03/07/2015   Pre-Op Dx: Cataract Left Eye  Post-Op Dx: Senile Combined Cataract Left  Eye,  Dx Code N98.921  Surgeon: Tonny Branch, M.D.  Assistants: None  Anesthesia: Topical with MAC  Indications: Painless, progressive loss of vision with compromise of daily activities.  Surgery: Cataract Extraction with Intraocular lens Implant Left Eye  Discription: The patient had dilating drops and viscous lidocaine placed into the Left eye in the pre-op holding area. After transfer to the operating room, a time out was performed. The patient was then prepped and draped. Beginning with a 14 degree blade a paracentesis port was made at the surgeon's 2 o'clock position. The anterior chamber was then filled with 1% non-preserved lidocaine. This was followed by filling the anterior chamber with Provisc.  A 2.83mm keratome blade was used to make a clear corneal incision at the temporal limbus.  A bent cystatome needle was used to create a continuous tear capsulotomy. Hydrodissection was performed with balanced salt solution on a Fine canula. The lens nucleus was then removed using the phacoemulsification handpiece. Residual cortex was removed with the I&A handpiece. The anterior chamber and capsular bag were refilled with Provisc. A posterior chamber intraocular lens was placed into the capsular bag with it's injector. The implant was positioned with the Kuglan hook. The Provisc was then removed from the anterior chamber and capsular bag with the I&A handpiece. Stromal hydration of the main incision and paracentesis port was performed with BSS on a Fine canula. The wounds were tested for leak which was negative. The patient tolerated the procedure well. There were no operative complications. The patient was then transferred to the recovery room in stable condition.  Complications: None  Specimen: None  EBL: None  Prosthetic device: Hoya iSert 250, power 20.5 D, SN  [id number].

## 2015-03-07 NOTE — Anesthesia Procedure Notes (Signed)
Procedure Name: MAC Date/Time: 03/07/2015 11:20 AM Performed by: Vista Deck Pre-anesthesia Checklist: Patient identified, Emergency Drugs available, Suction available, Timeout performed and Patient being monitored Patient Re-evaluated:Patient Re-evaluated prior to inductionOxygen Delivery Method: Nasal Cannula

## 2015-03-08 ENCOUNTER — Encounter (HOSPITAL_COMMUNITY): Payer: Self-pay | Admitting: Ophthalmology

## 2015-03-08 MED ORDER — LIDOCAINE HCL (PF) 1 % IJ SOLN
INTRAMUSCULAR | Status: DC | PRN
  Administered 2015-03-07: .4 mL

## 2015-06-25 ENCOUNTER — Other Ambulatory Visit (HOSPITAL_COMMUNITY): Payer: Self-pay | Admitting: Family Medicine

## 2015-06-25 DIAGNOSIS — M810 Age-related osteoporosis without current pathological fracture: Secondary | ICD-10-CM

## 2015-07-01 ENCOUNTER — Other Ambulatory Visit (HOSPITAL_COMMUNITY): Payer: Self-pay | Admitting: Family Medicine

## 2015-07-01 ENCOUNTER — Ambulatory Visit (HOSPITAL_COMMUNITY)
Admission: RE | Admit: 2015-07-01 | Discharge: 2015-07-01 | Disposition: A | Discharge status: Home / Self Care | Payer: Medicare Other | Source: Ambulatory Visit | Attending: Family Medicine | Admitting: Family Medicine

## 2015-07-01 DIAGNOSIS — Z780 Asymptomatic menopausal state: Secondary | ICD-10-CM | POA: Insufficient documentation

## 2015-07-01 DIAGNOSIS — M810 Age-related osteoporosis without current pathological fracture: Secondary | ICD-10-CM | POA: Diagnosis present

## 2015-09-24 ENCOUNTER — Ambulatory Visit: Payer: Medicare Other | Admitting: Orthopedic Surgery

## 2015-10-03 ENCOUNTER — Ambulatory Visit (INDEPENDENT_AMBULATORY_CARE_PROVIDER_SITE_OTHER): Payer: Medicare Other | Admitting: Orthopedic Surgery

## 2015-10-03 ENCOUNTER — Ambulatory Visit (INDEPENDENT_AMBULATORY_CARE_PROVIDER_SITE_OTHER): Payer: Medicare Other

## 2015-10-03 VITALS — BP 105/45 | Ht 63.0 in | Wt 160.0 lb

## 2015-10-03 DIAGNOSIS — S72141D Displaced intertrochanteric fracture of right femur, subsequent encounter for closed fracture with routine healing: Secondary | ICD-10-CM | POA: Diagnosis not present

## 2015-10-03 DIAGNOSIS — G894 Chronic pain syndrome: Secondary | ICD-10-CM | POA: Diagnosis not present

## 2015-10-03 DIAGNOSIS — R110 Nausea: Secondary | ICD-10-CM

## 2015-10-03 DIAGNOSIS — M25552 Pain in left hip: Secondary | ICD-10-CM | POA: Diagnosis not present

## 2015-10-03 MED ORDER — METHYLPREDNISOLONE ACETATE 40 MG/ML IJ SUSP
80.0000 mg | Freq: Once | INTRAMUSCULAR | Status: AC
  Administered 2015-10-03: 80 mg via INTRAMUSCULAR

## 2015-10-03 MED ORDER — MEPERIDINE-PROMETHAZINE 50-25 MG PO CAPS: 1.0000 | ORAL_CAPSULE | ORAL | Status: DC | PRN

## 2015-10-03 MED ORDER — PROMETHAZINE HCL 12.5 MG PO TABS: 12.5000 mg | ORAL_TABLET | Freq: Four times a day (QID) | ORAL | Status: DC | PRN

## 2015-10-03 MED ORDER — MEPERIDINE HCL 50 MG PO TABS: 50.0000 mg | ORAL_TABLET | ORAL | Status: DC | PRN

## 2015-10-04 ENCOUNTER — Encounter: Payer: Self-pay | Admitting: Orthopedic Surgery

## 2015-10-04 NOTE — Progress Notes (Signed)
Chief complaint left hip pain  History this 69 year old female had left gamma nail for intertrochanteric fracture. She comes in today complaining of lower back and left hip pain radiating down into her left thigh with a dull aching burning sensation which is moderate to severe unrelieved by over-the-counter medications and she's not had it for several weeks. The pain is intermittently intense but is always there. It appears to be worsened with activity  She has a history of lumbar disc disease and that was deemed nonoperable  Review of systems bowel function normal Urinary symptoms normal No recent history of cancer No recent weight loss or fever or night sweats  Past Medical History  Diagnosis Date  . Degenerative joint disease   . Chronic low back pain   . Hypertension   . Anemia   . Hip fracture, left 03/12/2012   BP 105/45 mmHg  Ht 5\' 3"  (1.6 m)  Wt 160 lb (72.576 kg)  BMI 28.35 kg/m2 Well-defined is a well-developed well-nourished female who walks with a slightly crouched gait she is oriented 3 her mood is pleasant general appearance is otherwise normal. On inspection we have tenderness in the lower back left gluteal region no tenderness over the left greater trochanter normal range of motion in the left hip shows decreased range of motion of lumbar spine with flexion-extension. Hip is stable motor exam is normal in both lower extremities. Skin intact over the surgical scar no caf lesions over the lumbar spine. Distally she has good pulses in both feet. Sensation her straight leg raise is equivocal her toes are downgoing  X-rays show a stable gamma nail with some heterotopic bone from the fracture when it was treated it was comminuted  Recommend IM shot of Depo-Medrol 80 mg  She will also get Demerol and Phenergan she is allergic to every other pain medication and this has worked well for her in the past and she has not had any episodes of abuse of the medication  Procedure note  verbal consent was obtained timeout was executed to give intramuscular injection of the left hip and gluteal area.  80 mg of Depo-Medrol was injected IM  No complications.

## 2015-10-24 ENCOUNTER — Ambulatory Visit: Payer: Medicare Other | Admitting: Gastroenterology

## 2015-11-07 ENCOUNTER — Ambulatory Visit: Payer: Medicare Other | Admitting: Orthopedic Surgery

## 2015-11-20 ENCOUNTER — Ambulatory Visit: Payer: Medicare Other | Admitting: Orthopedic Surgery

## 2015-11-20 ENCOUNTER — Encounter: Payer: Self-pay | Admitting: *Deleted

## 2015-11-25 ENCOUNTER — Ambulatory Visit (INDEPENDENT_AMBULATORY_CARE_PROVIDER_SITE_OTHER): Payer: Medicare Other | Admitting: Orthopedic Surgery

## 2015-11-25 VITALS — BP 111/75 | Ht 63.0 in | Wt 160.0 lb

## 2015-11-25 DIAGNOSIS — G894 Chronic pain syndrome: Secondary | ICD-10-CM | POA: Diagnosis not present

## 2015-11-25 DIAGNOSIS — G8929 Other chronic pain: Secondary | ICD-10-CM

## 2015-11-25 DIAGNOSIS — S72141D Displaced intertrochanteric fracture of right femur, subsequent encounter for closed fracture with routine healing: Secondary | ICD-10-CM | POA: Diagnosis not present

## 2015-11-25 DIAGNOSIS — M25552 Pain in left hip: Secondary | ICD-10-CM

## 2015-11-25 DIAGNOSIS — R110 Nausea: Secondary | ICD-10-CM

## 2015-11-25 DIAGNOSIS — M5137 Other intervertebral disc degeneration, lumbosacral region: Secondary | ICD-10-CM

## 2015-11-25 DIAGNOSIS — M549 Dorsalgia, unspecified: Secondary | ICD-10-CM

## 2015-11-25 MED ORDER — METHYLPREDNISOLONE ACETATE 80 MG/ML IJ SUSP: 80.0000 mg | Freq: Once | INTRAMUSCULAR | Status: DC

## 2015-11-25 MED ORDER — PROMETHAZINE HCL 12.5 MG PO TABS: 12.5000 mg | ORAL_TABLET | Freq: Four times a day (QID) | ORAL | Status: DC | PRN

## 2015-11-25 MED ORDER — METHYLPREDNISOLONE ACETATE 40 MG/ML IJ SUSP
40.0000 mg | Freq: Once | INTRAMUSCULAR | Status: AC
  Administered 2015-11-25: 80 mg via INTRAMUSCULAR

## 2015-11-25 MED ORDER — MEPERIDINE HCL 50 MG PO TABS
50.0000 mg | ORAL_TABLET | ORAL | Status: DC | PRN
Start: 2015-11-25 — End: 2016-02-24

## 2015-11-25 MED ORDER — METHYLPREDNISOLONE ACETATE 40 MG/ML IJ SUSP: 40.0000 mg | Freq: Once | INTRAMUSCULAR | Status: DC

## 2015-11-25 NOTE — Progress Notes (Signed)
Routine follow-up  Chief Complaint  Patient presents with  . Follow-up    1 month recheck on left hip with medication refill.    The patient had a left gamma nail placed for hip fracture but she has nonoperable lumbar spine disease complains of back pain radiating to her left hip  She has chronic pain related to that  She is on Demerol and Phenergan secondary to allergies  She comes in today for evaluation and assessment of her medication situation since she has ongoing pain  Pain medication is holding her very well.  Review of systems related only to back pain without radiculopathy over the left lower extremity but pain related and radiating to the left hip  Back tenderness is noted. Hip flexion is normal hip strength is normal range of motion in the hip is normal hip is stable. No vascular deficits in the foot with good pulse and no sensory changes the lower leg  Impression chronic pain Impression status post internal fixation left hip  Verbal consent obtained Timeout to confirm injection site is left buttock A steroid injection was performed at left buttock using 1% plain Lidocaine and 80 mg of Depo-Medrol. This was well tolerated.   Encounter Diagnoses  Name Primary?  . Intertrochanteric fracture of right hip, closed, with routine healing, subsequent encounter   . Nausea without vomiting   . Chronic pain syndrome Yes  . DDD (degenerative disc disease), lumbosacral     Meds ordered this encounter  Medications  . meperidine (DEMEROL) 50 MG tablet    Sig: Take 1 tablet (50 mg total) by mouth every 4 (four) hours as needed for moderate pain.    Dispense:  90 tablet    Refill:  0  . promethazine (PHENERGAN) 12.5 MG tablet    Sig: Take 1 tablet (12.5 mg total) by mouth every 6 (six) hours as needed for nausea.    Dispense:  90 tablet    Refill:  0   3 month follow-up for medication, patient will probably eventually be referred for pain management

## 2015-11-25 NOTE — Addendum Note (Signed)
Addended by: Baldomero Lamy B on: 11/25/2015 02:26 PM   Modules accepted: Orders

## 2016-01-16 ENCOUNTER — Ambulatory Visit: Payer: Medicare Other | Admitting: Nurse Practitioner

## 2016-02-07 ENCOUNTER — Ambulatory Visit (INDEPENDENT_AMBULATORY_CARE_PROVIDER_SITE_OTHER): Payer: Medicare Other | Admitting: Gastroenterology

## 2016-02-07 ENCOUNTER — Other Ambulatory Visit: Payer: Self-pay

## 2016-02-07 ENCOUNTER — Encounter: Payer: Self-pay | Admitting: Gastroenterology

## 2016-02-07 VITALS — BP 127/85 | HR 89 | Temp 97.2°F | Ht 62.0 in | Wt 158.8 lb

## 2016-02-07 DIAGNOSIS — R1310 Dysphagia, unspecified: Secondary | ICD-10-CM | POA: Diagnosis not present

## 2016-02-07 DIAGNOSIS — Z1211 Encounter for screening for malignant neoplasm of colon: Secondary | ICD-10-CM | POA: Diagnosis not present

## 2016-02-07 MED ORDER — NA SULFATE-K SULFATE-MG SULF 17.5-3.13-1.6 GM/177ML PO SOLN
1.0000 | ORAL | Status: DC
Start: 2016-02-07 — End: 2016-03-04

## 2016-02-07 NOTE — Patient Instructions (Signed)
We have scheduled you for a colonoscopy and upper endoscopy with dilation in the near future.   Continue to chew well, avoid breads and tough meats.

## 2016-02-07 NOTE — Progress Notes (Signed)
Primary Care Physician:  Jani Gravel, MD Primary Gastroenterologist:  Dr. Oneida Alar   Chief Complaint  Patient presents with  . Dysphagia    HPI:   Ellen Anderson is a 70 y.o. female presenting today with a history of food impaction (pork chop) on 2 separate occasions, last in 2011, requiring emergent EGD. She presents today with dysphagia.   States she has chronic dysphagia, just worsening over the past few months. Difficulty with meats, breads. Soft foods are tolerated. No trouble with pepsi. No abdominal pain. Keeps a lot of gas. No nausea or vomiting. She is unsure if she has ever had a colonoscopy. No worsening or significant constipation or diarrhea. Takes a stool softener daily, as she is on vitamins that constipate her. No rectal bleeding. Agreeable to do a colonoscopy.    Past Medical History  Diagnosis Date  . Degenerative joint disease   . Chronic low back pain   . Hypertension   . Anemia   . Hip fracture, left ([place]) 03/12/2012    Past Surgical History  Procedure Laterality Date  . Back surgery    . Ankle surgery Left     pins and screws  . Abdominal hysterectomy    . Cholecystectomy    . Appendectomy    . Hip surgery Bilateral     fractured bilateral hips  . Intramedullary (im) nail intertrochanteric Right 03/08/2013    Procedure: INTRAMEDULLARY (IM) NAIL INTERTROCHANTRIC;  Surgeon: Carole Civil, MD;  Location: [facility];  Service: Orthopedics;  Laterality: Right;  . Gastric bypass  1981  . Cataract extraction w/phaco Right 02/18/2015    Procedure: CATARACT EXTRACTION PHACO AND INTRAOCULAR LENS PLACEMENT RIGHT EYE; CDE:  10.48;  Surgeon: Tonny Branch, MD;  Location: [facility];  Service: Ophthalmology;  Laterality: Right;  . Cataract extraction w/phaco Left 03/07/2015    Procedure: CATARACT EXTRACTION PHACO AND INTRAOCULAR LENS PLACEMENT (IOC);  Surgeon: Tonny Branch, MD;  Location: [facility];  Service: Ophthalmology;  Laterality: Left;  CDE:7.14  . Esophagogastroduodenoscopy   07/23/2004    Dr. Rehman:foreign body impactedat cervical esophagus PORK CHOP, dilation of esophageal web  . Esophagogastroduodenoscopy  2011    Dr. Oneida Alar: food bolus which would not pass through gastric remnant into jejunostomy, PORK CHOP removed. AVOID PORK CHOPS    Current Outpatient Prescriptions  Medication Sig Dispense Refill  . aspirin 325 MG tablet Take 325 mg by mouth daily as needed for moderate pain or headache.    . denosumab (PROLIA) 60 MG/ML SOLN injection Inject 60 mg into the skin every 6 (six) months. Administer in upper arm, thigh, or abdomen    . diazepam (VALIUM) 10 MG tablet Take 1 tablet (10 mg total) by mouth every 8 (eight) hours as needed. For muscle spasms 30 tablet 0   No current facility-administered medications for this visit.    Allergies as of 02/07/2016 - Review Complete 02/07/2016  Allergen Reaction Noted  . Amoxicillin-pot clavulanate Swelling 12/25/2011  . Ceclor [cefaclor] Swelling 12/25/2011  . Morphine and related Other (See Comments) 12/25/2011  . Sulfur Shortness Of Breath 12/25/2011  . Azithromycin Other (See Comments) 12/26/2011  . Ivp dye [iodinated diagnostic agents] Other (See Comments) 12/25/2011  . Methadone Other (See Comments) 12/25/2011  . Percocet [oxycodone-acetaminophen] Itching 12/25/2011  . Betadine [povidone iodine] Rash 02/18/2015  . Codeine Hives and Rash 12/25/2011  . Escitalopram oxalate Other (See Comments) 12/25/2011  . Ibuprofen Hives and Rash 12/26/2011  . Tylenol [acetaminophen] Hives and Rash 12/26/2011  Family History  Problem Relation Age of Onset  . Throat cancer Mother   . COPD Father   . Stomach cancer Sister   . Colon cancer Neg Hx   . Colon polyps Sister     Social History   Social History  . Marital Status: Widowed    Spouse Name: N/A  . Number of Children: N/A  . Years of Education: N/A   Occupational History  . Not on file.   Social History Main Topics  . Smoking status: Never Smoker     . Smokeless tobacco: Never Used  . Alcohol Use: No  . Drug Use: No  . Sexual Activity: No   Other Topics Concern  . Not on file   Social History Narrative    Review of Systems: Gen: Denies any fever, chills, fatigue, weight loss, lack of appetite.  CV: Denies chest pain, heart palpitations, peripheral edema, syncope.  Resp: Denies shortness of breath at rest or with exertion. Denies wheezing or cough.  GI: see HPI  GU : Denies urinary burning, urinary frequency, urinary hesitancy MS: +joint pain  Derm: Denies rash, itching, dry skin Psych: Denies depression, anxiety, memory loss, and confusion Heme: Denies bruising, bleeding, and enlarged lymph nodes.  Physical Exam: BP 127/85 mmHg  Pulse 89  Temp(Src) 97.2 F (36.2 C)  Ht 5\' 2"  (1.575 m)  Wt 158 lb 12.8 oz (72.031 kg)  BMI 29.04 kg/m2 General:   Alert and oriented. Pleasant and cooperative. Well-nourished and well-developed.  Head:  Normocephalic and atraumatic. Eyes:  Without icterus, sclera clear and conjunctiva pink.  Ears:  Normal auditory acuity. Nose:  No deformity, discharge,  or lesions. Mouth:  No deformity or lesions, oral mucosa pink.  Lungs:  Clear to auscultation bilaterally. No wheezes, rales, or rhonchi. No distress.  Heart:  S1, S2 present without murmurs appreciated.  Abdomen:  +BS, soft, non-tender and non-distended. No HSM noted. No guarding or rebound. No masses appreciated.  Rectal:  Deferred  Msk:  Symmetrical without gross deformities. Normal posture. Extremities:  Without  edema. Neurologic:  Alert and  oriented x4;  grossly normal neurologically. Psych:  Alert and cooperative. Normal mood and affect.

## 2016-02-17 NOTE — Progress Notes (Signed)
CC'D TO PCP °

## 2016-02-17 NOTE — Assessment & Plan Note (Signed)
70 year old female with history of food impaction X 2 in the past, last in 2011, both involving pork chops. History of gastric bypass in remote past. Presenting with chronic dysphagia that has worsened over past few months. Will proceed with EGD and dilation in the near future. I have advised her to abstain from tough meats, chew well, take small bites. She states understanding.   Proceed with upper endoscopy and dilation in the near future with Dr. Oneida Alar. The risks, benefits, and alternatives have been discussed in detail with patient. They have stated understanding and desire to proceed.

## 2016-02-17 NOTE — Assessment & Plan Note (Signed)
No prior screening colonoscopy. No concerning lower GI symptoms.   Proceed with colonoscopy with Dr. Oneida Alar in the near future. The risks, benefits, and alternatives have been discussed in detail with the patient. They state understanding and desire to proceed.

## 2016-02-24 ENCOUNTER — Other Ambulatory Visit: Payer: Self-pay | Admitting: Gastroenterology

## 2016-02-24 ENCOUNTER — Ambulatory Visit (INDEPENDENT_AMBULATORY_CARE_PROVIDER_SITE_OTHER): Payer: Medicare Other | Admitting: Orthopedic Surgery

## 2016-02-24 ENCOUNTER — Telehealth: Payer: Self-pay

## 2016-02-24 VITALS — BP 112/75 | Ht 62.0 in | Wt 158.0 lb

## 2016-02-24 DIAGNOSIS — M549 Dorsalgia, unspecified: Secondary | ICD-10-CM | POA: Diagnosis not present

## 2016-02-24 DIAGNOSIS — M25552 Pain in left hip: Secondary | ICD-10-CM | POA: Diagnosis not present

## 2016-02-24 DIAGNOSIS — G8929 Other chronic pain: Secondary | ICD-10-CM

## 2016-02-24 DIAGNOSIS — S72141D Displaced intertrochanteric fracture of right femur, subsequent encounter for closed fracture with routine healing: Secondary | ICD-10-CM | POA: Diagnosis not present

## 2016-02-24 DIAGNOSIS — R110 Nausea: Secondary | ICD-10-CM | POA: Diagnosis not present

## 2016-02-24 MED ORDER — METHYLPREDNISOLONE ACETATE 40 MG/ML IJ SUSP
40.0000 mg | Freq: Once | INTRAMUSCULAR | Status: AC
  Administered 2016-02-24: 40 mg via INTRAMUSCULAR

## 2016-02-24 MED ORDER — PROMETHAZINE HCL 12.5 MG PO TABS: 12.5000 mg | ORAL_TABLET | Freq: Four times a day (QID) | ORAL | Status: DC | PRN

## 2016-02-24 MED ORDER — MEPERIDINE HCL 50 MG PO TABS: 50.0000 mg | ORAL_TABLET | ORAL | Status: DC | PRN

## 2016-02-24 NOTE — Telephone Encounter (Signed)
Called pt and she is aware of LSL recommendations and she states that she will be more aware of her senses when drinking her prep

## 2016-02-24 NOTE — Progress Notes (Signed)
Chief Complaint  Patient presents with  . Follow-up    3 month recheck on left hip.   HPI patient left hip gamma nail but has chronic lumbar disease status post 2 surgeries has now been told she is nonoperative. Complains of bilateral leg weakness. Complains of bilateral knee pain. Left hip pain. She can only take Demerol and Phenergan for pain and we are managing that for her on an ongoing basis he is here for three-month follow-up  Review of Systems  Constitutional: Negative for fever, chills, weight loss and malaise/fatigue.  Musculoskeletal: Positive for back pain and joint pain.  Neurological: Negative for tingling.       Generalized bilateral lower leg weakness  Psychiatric/Behavioral: Negative for depression.    Past Medical History  Diagnosis Date  . Degenerative joint disease   . Chronic low back pain   . Hypertension   . Anemia   . Hip fracture, left ([place]) 03/12/2012    Past Surgical History  Procedure Laterality Date  . Back surgery    . Ankle surgery Left     pins and screws  . Abdominal hysterectomy    . Cholecystectomy    . Appendectomy    . Hip surgery Bilateral     fractured bilateral hips  . Intramedullary (im) nail intertrochanteric Right 03/08/2013    Procedure: INTRAMEDULLARY (IM) NAIL INTERTROCHANTRIC;  Surgeon: Carole Civil, MD;  Location: [facility];  Service: Orthopedics;  Laterality: Right;  . Gastric bypass  1981  . Cataract extraction w/phaco Right 02/18/2015    Procedure: CATARACT EXTRACTION PHACO AND INTRAOCULAR LENS PLACEMENT RIGHT EYE; CDE:  10.48;  Surgeon: Tonny Branch, MD;  Location: [facility];  Service: Ophthalmology;  Laterality: Right;  . Cataract extraction w/phaco Left 03/07/2015    Procedure: CATARACT EXTRACTION PHACO AND INTRAOCULAR LENS PLACEMENT (IOC);  Surgeon: Tonny Branch, MD;  Location: [facility];  Service: Ophthalmology;  Laterality: Left;  CDE:7.14  . Esophagogastroduodenoscopy  07/23/2004    Dr. Rehman:foreign body impactedat cervical  esophagus PORK CHOP, dilation of esophageal web  . Esophagogastroduodenoscopy  2011    Dr. Oneida Alar: food bolus which would not pass through gastric remnant into jejunostomy, PORK CHOP removed. AVOID PORK CHOPS   Family History  Problem Relation Age of Onset  . Throat cancer Mother   . COPD Father   . Stomach cancer Sister   . Colon cancer Neg Hx   . Colon polyps Sister    Social History  Substance Use Topics  . Smoking status: Never Smoker   . Smokeless tobacco: Never Used  . Alcohol Use: No    Current outpatient prescriptions:  .  aspirin 325 MG tablet, Take 325 mg by mouth daily as needed for moderate pain or headache., Disp: , Rfl:  .  Calcium Carb-Cholecalciferol 600-500 MG-UNIT CAPS, Take 1 capsule by mouth 2 (two) times daily., Disp: , Rfl:  .  denosumab (PROLIA) 60 MG/ML SOLN injection, Inject 60 mg into the skin every 6 (six) months. Administer in upper arm, thigh, or abdomen, Disp: , Rfl:  .  diazepam (VALIUM) 10 MG tablet, Take 1 tablet (10 mg total) by mouth every 8 (eight) hours as needed. For muscle spasms, Disp: 30 tablet, Rfl: 0 .  ergocalciferol (VITAMIN D2) 50000 units capsule, Take 50,000 Units by mouth once a week. Every Tuesday., Disp: , Rfl:  .  Na Sulfate-K Sulfate-Mg Sulf (SUPREP BOWEL PREP) SOLN, Take 1 kit by mouth as directed., Disp: 1 Bottle, Rfl: 0 .  Propylene Glycol (  SYSTANE BALANCE) 0.6 % SOLN, Place 1 drop into both eyes daily as needed (dry eyes)., Disp: , Rfl:   BP 112/75 mmHg  Ht _0  (1.575 m)  Wt 158 lb (71.668 kg)  BMI 28.89 kg/m2  Physical Exam  Constitutional: She is oriented to person, place, and time. She appears well-developed and well-nourished. No distress.  Cardiovascular: Normal rate and intact distal pulses.   Neurological: She is alert and oriented to person, place, and time. She exhibits normal muscle tone. Coordination normal.  Skin: Skin is warm and dry. No rash noted. She is not diaphoretic. No erythema. No pallor.   Psychiatric: She has a normal mood and affect. Her behavior is normal. Judgment and thought content normal.   Both knees are stable. She has good range of motion in both knees no effusions in either knee. Her weakness is not detectable to manual muscle testing in either leg  She has severe back pain with palpation she is tender in the midline of the spine in both paraspinous musculatures with lumbar spine musculature increased muscle tension  Chronic back pain Status post IM nail left hip fracture Chronic pain  Intramuscular injection was requested and given 40 mg of IM Depo-Medrol Refill Demerol and Phenergan  Return in 3 months Ortho Exam  A steroid injection was performed at left hip IM using 1% plain Lidocaine and 40 mg Depo-Medrol 1% lidocaine 3 mL  No complications

## 2016-02-24 NOTE — Telephone Encounter (Signed)
Pt called and is concerned that she may be allergic to her colonoscopy prep because it contains sulfate and she is allergic to Sulfur.   Consulted with LSL and reviewed all prep kits in the office and all prep kits contain some kind of sulfate.  States that we have had people allergic to sulfa in the past that have taken preps with no reaction to them.  Pt should be fine but should just be aware of the possibility.

## 2016-03-04 ENCOUNTER — Encounter (HOSPITAL_COMMUNITY): Payer: Self-pay | Admitting: *Deleted

## 2016-03-04 ENCOUNTER — Ambulatory Visit (HOSPITAL_COMMUNITY)
Admission: RE | Admit: 2016-03-04 | Discharge: 2016-03-04 | Disposition: A | Discharge status: Home / Self Care | Payer: Medicare Other | Source: Ambulatory Visit | Attending: Gastroenterology | Admitting: Gastroenterology

## 2016-03-04 ENCOUNTER — Encounter (HOSPITAL_COMMUNITY)
Admission: RE | Disposition: A | Discharge status: Home / Self Care | Payer: Self-pay | Source: Ambulatory Visit | Attending: Gastroenterology

## 2016-03-04 DIAGNOSIS — R1310 Dysphagia, unspecified: Secondary | ICD-10-CM | POA: Insufficient documentation

## 2016-03-04 DIAGNOSIS — Z9884 Bariatric surgery status: Secondary | ICD-10-CM | POA: Diagnosis not present

## 2016-03-04 DIAGNOSIS — Z1211 Encounter for screening for malignant neoplasm of colon: Secondary | ICD-10-CM | POA: Diagnosis not present

## 2016-03-04 DIAGNOSIS — D128 Benign neoplasm of rectum: Secondary | ICD-10-CM

## 2016-03-04 DIAGNOSIS — I10 Essential (primary) hypertension: Secondary | ICD-10-CM | POA: Insufficient documentation

## 2016-03-04 DIAGNOSIS — Z79899 Other long term (current) drug therapy: Secondary | ICD-10-CM | POA: Insufficient documentation

## 2016-03-04 DIAGNOSIS — D122 Benign neoplasm of ascending colon: Secondary | ICD-10-CM | POA: Insufficient documentation

## 2016-03-04 DIAGNOSIS — K222 Esophageal obstruction: Secondary | ICD-10-CM | POA: Diagnosis not present

## 2016-03-04 DIAGNOSIS — K621 Rectal polyp: Secondary | ICD-10-CM | POA: Diagnosis not present

## 2016-03-04 DIAGNOSIS — K649 Unspecified hemorrhoids: Secondary | ICD-10-CM | POA: Diagnosis not present

## 2016-03-04 DIAGNOSIS — Z7982 Long term (current) use of aspirin: Secondary | ICD-10-CM | POA: Insufficient documentation

## 2016-03-04 HISTORY — PX: SAVORY DILATION: SHX5439

## 2016-03-04 HISTORY — PX: COLONOSCOPY: SHX5424

## 2016-03-04 HISTORY — DX: Other specified postprocedural states: R11.2

## 2016-03-04 HISTORY — DX: Other specified postprocedural states: Z98.890

## 2016-03-04 HISTORY — DX: Nausea with vomiting, unspecified: R11.2

## 2016-03-04 HISTORY — PX: ESOPHAGOGASTRODUODENOSCOPY: SHX5428

## 2016-03-04 SURGERY — COLONOSCOPY
Anesthesia: Moderate Sedation

## 2016-03-04 MED ORDER — LIDOCAINE VISCOUS 2 % MT SOLN
OROMUCOSAL | Status: AC
  Filled 2016-03-04: qty 15

## 2016-03-04 MED ORDER — MEPERIDINE HCL 100 MG/ML IJ SOLN
INTRAMUSCULAR | Status: DC | PRN
  Administered 2016-03-04 (×2): 25 mg via INTRAVENOUS
  Administered 2016-03-04: 50 mg via INTRAVENOUS
  Administered 2016-03-04: 25 mg via INTRAVENOUS

## 2016-03-04 MED ORDER — LIDOCAINE VISCOUS 2 % MT SOLN
OROMUCOSAL | Status: DC | PRN
  Administered 2016-03-04: 4 mL via OROMUCOSAL

## 2016-03-04 MED ORDER — PROMETHAZINE HCL 25 MG/ML IJ SOLN
INTRAMUSCULAR | Status: AC
  Filled 2016-03-04: qty 1

## 2016-03-04 MED ORDER — MIDAZOLAM HCL 5 MG/5ML IJ SOLN
INTRAMUSCULAR | Status: DC | PRN
  Administered 2016-03-04: 2 mg via INTRAVENOUS
  Administered 2016-03-04: 1 mg via INTRAVENOUS
  Administered 2016-03-04 (×3): 2 mg via INTRAVENOUS
  Administered 2016-03-04: 1 mg via INTRAVENOUS

## 2016-03-04 MED ORDER — MINERAL OIL PO OIL
TOPICAL_OIL | ORAL | Status: AC
  Filled 2016-03-04: qty 30

## 2016-03-04 MED ORDER — SODIUM CHLORIDE 0.9% FLUSH
INTRAVENOUS | Status: AC
  Filled 2016-03-04: qty 10

## 2016-03-04 MED ORDER — SODIUM CHLORIDE 0.9 % IV SOLN
INTRAVENOUS | Status: DC
  Administered 2016-03-04: 1000 mL via INTRAVENOUS

## 2016-03-04 MED ORDER — MEPERIDINE HCL 100 MG/ML IJ SOLN
INTRAMUSCULAR | Status: AC
  Filled 2016-03-04: qty 2

## 2016-03-04 MED ORDER — SIMETHICONE 40 MG/0.6ML PO SUSP
ORAL | Status: DC | PRN
  Administered 2016-03-04: 10:00:00

## 2016-03-04 MED ORDER — PROMETHAZINE HCL 25 MG/ML IJ SOLN
12.5000 mg | Freq: Once | INTRAMUSCULAR | Status: AC
  Administered 2016-03-04: 12.5 mg via INTRAVENOUS

## 2016-03-04 MED ORDER — PROMETHAZINE HCL 25 MG/ML IJ SOLN
INTRAMUSCULAR | Status: DC | PRN
  Administered 2016-03-04: 12.5 mg via INTRAVENOUS

## 2016-03-04 MED ORDER — MIDAZOLAM HCL 5 MG/5ML IJ SOLN
INTRAMUSCULAR | Status: AC
  Filled 2016-03-04: qty 10

## 2016-03-04 NOTE — Discharge Instructions (Signed)
You had 3 SMALL polyps removed. YOU HAVE INTERNAL HEMORRHOIDS, WHICH CAN CAUSE RECTAL BLEEDING. I STRETCHED YOUR ESOPHAGUS DUE to a YOUR COMPLAINT OF DYSPHAGIA. YOUR POUCH IS ONLY 5 CM AND YOUR DIFFICULTY SWALLOWING IS MOST LIKELY DUE TO A FOOD BOLUS THAT IS STUCK IN YOUR POUCH BUT EVENTUALLY MOVES ON.      DRINK WATER TO KEEP YOUR URINE LIGHT YELLOW.  FOLLOW A HIGH FIBER DIET. AVOID ITEMS THAT CAUSE BLOATING. SEE INFO BELOW.  REDUCE YOUR ASPIRIN DOSE TO 81 MG DAILY TO DECREASE THE RISK OF A GI BLEED DUE TO ASPIRIN USE.  YOUR BIOPSY RESULTS WILL BE AVAILABLE IN MY CHART APR 3 AND MY OFFICE WILL CONTACT YOU IN 10-14 DAYS WITH YOUR RESULTS.   Follow up in 4 mos.  Next colonoscopy in 5-10 years.    ENDOSCOPY Care After Read the instructions outlined below and refer to this sheet in the next week. These discharge instructions provide you with general information on caring for yourself after you leave the hospital. While your treatment has been planned according to the most current medical practices available, unavoidable complications occasionally occur. If you have any problems or questions after discharge, call DR. Erza Mothershead, [phone].  ACTIVITY  You may resume your regular activity, but move at a slower pace for the next 24 hours.   Take frequent rest periods for the next 24 hours.   Walking will help get rid of the air and reduce the bloated feeling in your belly (abdomen).   No driving for 24 hours (because of the medicine (anesthesia) used during the test).   You may shower.   Do not sign any important legal documents or operate any machinery for 24 hours (because of the anesthesia used during the test).    NUTRITION  Drink plenty of fluids.   You may resume your normal diet as instructed by your doctor.   Begin with a light meal and progress to your normal diet. Heavy or fried foods are harder to digest and may make you feel sick to your stomach (nauseated).   Avoid  alcoholic beverages for 24 hours or as instructed.    MEDICATIONS  You may resume your normal medications.   WHAT YOU CAN EXPECT TODAY  Some feelings of bloating in the abdomen.   Passage of more gas than usual.   Spotting of blood in your stool or on the toilet paper  .  IF YOU HAD POLYPS REMOVED DURING THE ENDOSCOPY:  Eat a soft diet IF YOU HAVE NAUSEA, BLOATING, ABDOMINAL PAIN, OR VOMITING.    FINDING OUT THE RESULTS OF YOUR TEST Not all test results are available during your visit. DR. Oneida Alar WILL CALL YOU WITHIN 14 DAYS OF YOUR PROCEDUE WITH YOUR RESULTS. Do not assume everything is normal if you have not heard from DR. Anshika Pethtel, CALL HER OFFICE AT [phone].  SEEK IMMEDIATE MEDICAL ATTENTION AND CALL THE OFFICE: [phone] IF:  You have more than a spotting of blood in your stool.   Your belly is swollen (abdominal distention).   You are nauseated or vomiting.   You have a temperature over 101F.   You have abdominal pain or discomfort that is severe or gets worse throughout the day.   High-Fiber Diet A high-fiber diet changes your normal diet to include more whole grains, legumes, fruits, and vegetables. Changes in the diet involve replacing refined carbohydrates with unrefined foods. The calorie level of the diet is essentially unchanged. The Dietary Reference Intake (recommended amount)  for adult males is 38 grams per day. For adult females, it is 25 grams per day. Pregnant and lactating women should consume 28 grams of fiber per day. Fiber is the intact part of a plant that is not broken down during digestion. Functional fiber is fiber that has been isolated from the plant to provide a beneficial effect in the body. PURPOSE  Increase stool bulk.   Ease and regulate bowel movements.   Lower cholesterol.  REDUCE RISK OF COLON CANCER  INDICATIONS THAT YOU NEED MORE FIBER  Constipation and hemorrhoids.   Uncomplicated diverticulosis (intestine condition)  and irritable bowel syndrome.   Weight management.   As a protective measure against hardening of the arteries (atherosclerosis), diabetes, and cancer.   GUIDELINES FOR INCREASING FIBER IN THE DIET  Start adding fiber to the diet slowly. A gradual increase of about 5 more grams (2 slices of whole-wheat bread, 2 servings of most fruits or vegetables, or 1 bowl of high-fiber cereal) per day is best. Too rapid an increase in fiber may result in constipation, flatulence, and bloating.   Drink enough water and fluids to keep your urine clear or pale yellow. Water, juice, or caffeine-free drinks are recommended. Not drinking enough fluid may cause constipation.   Eat a variety of high-fiber foods rather than one type of fiber.   Try to increase your intake of fiber through using high-fiber foods rather than fiber pills or supplements that contain small amounts of fiber.   The goal is to change the types of food eaten. Do not supplement your present diet with high-fiber foods, but replace foods in your present diet.   INCLUDE A VARIETY OF FIBER SOURCES  Replace refined and processed grains with whole grains, canned fruits with fresh fruits, and incorporate other fiber sources. White rice, white breads, and most bakery goods contain little or no fiber.   Brown whole-grain rice, buckwheat oats, and many fruits and vegetables are all good sources of fiber. These include: broccoli, Brussels sprouts, cabbage, cauliflower, beets, sweet potatoes, white potatoes (skin on), carrots, tomatoes, eggplant, squash, berries, fresh fruits, and dried fruits.   Cereals appear to be the richest source of fiber. Cereal fiber is found in whole grains and bran. Bran is the fiber-rich outer coat of cereal grain, which is largely removed in refining. In whole-grain cereals, the bran remains. In breakfast cereals, the largest amount of fiber is found in those with "bran" in their names. The fiber content is sometimes  indicated on the label.   You may need to include additional fruits and vegetables each day.   In baking, for 1 cup white flour, you may use the following substitutions:   1 cup whole-wheat flour minus 2 tablespoons.   1/2 cup white flour plus 1/2 cup whole-wheat flour.    Polyps, Colon  A polyp is extra tissue that grows inside your body. Colon polyps grow in the large intestine. The large intestine, also called the colon, is part of your digestive system. It is a long, hollow tube at the end of your digestive tract where your body makes and stores stool. Most polyps are not dangerous. They are benign. This means they are not cancerous. But over time, some types of polyps can turn into cancer. Polyps that are smaller than a pea are usually not harmful. But larger polyps could someday become or may already be cancerous. To be safe, doctors remove all polyps and test them.   PREVENTION There is not one  sure way to prevent polyps. You might be able to lower your risk of getting them if you:  Eat more fruits and vegetables and less fatty food.   Do not smoke.   Avoid alcohol.   Exercise every day.   Lose weight if you are overweight.   Eating more calcium and folate can also lower your risk of getting polyps. Some foods that are rich in calcium are milk, cheese, and broccoli. Some foods that are rich in folate are chickpeas, kidney beans, and spinach.    REFLUX  SYMPTOMS Common symptoms of GERD are heartburn (burning in your chest). This is worse when lying down or bending over. It may also cause belching, or difficulty swallowing, and indigestion. Some of the things which make GERD worse are:   Increased weight pushes on stomach making acid rise more easily.   Smoking markedly increases acid production.   Alcohol decreases lower esophageal sphincter pressure (valve between stomach and esophagus), allowing acid from stomach into esophagus.   Late evening meals and going to bed  with a full stomach increases pressure.   HOME CARE INSTRUCTIONS  Try to achieve and maintain an ideal body weight.   Avoid drinking alcoholic beverages.   DO NOT smokE.   Do not wear tight clothing around your chest or stomach.   Eat smaller meals and eat more frequently. This keeps your stomach from getting too full. Eat slowly.   Do not lie down for 2 or 3 hours after eating. Do not eat or drink anything 1 to 2 hours before going to bed.   Avoid caffeine beverages (colas, coffee, cocoa, tea), fatty foods, citrus fruits and all other foods and drinks that contain acid and that seem to increase the problems.   Avoid bending over, especially after eating OR STRAINING. Anything that increases the pressure in your belly increases the amount of acid that may be pushed up into your esophagus.   Gastritis  Gastritis is an inflammation (the body's way of reacting to injury and/or infection) of the stomach. It is often caused by viral or bacterial (germ) infections. It can also be caused BY ASPIRIN, BC/GOODY POWDER'S, (IBUPROFEN) MOTRIN, OR ALEVE (NAPROXEN), chemicals (including alcohol), SPICY FOODS, and medications. This illness may be associated with generalized malaise (feeling tired, not well), UPPER ABDOMINAL STOMACH cramps, and fever. One common bacterial cause of gastritis is an organism known as H. Pylori. This can be treated with antibiotics.   Hemorrhoids Hemorrhoids are dilated (enlarged) veins around the rectum. Sometimes clots will form in the veins. This makes them swollen and painful. These are called thrombosed hemorrhoids. Causes of hemorrhoids include:  Constipation.   Straining to have a bowel movement.   HEAVY LIFTING  HOME CARE INSTRUCTIONS  Eat a well balanced diet and drink 6 to 8 glasses of water every day to avoid constipation. You may also use a bulk laxative.   Avoid straining to have bowel movements.   Keep anal area dry and clean.   Do not use a donut  shaped pillow or sit on the toilet for long periods. This increases blood pooling and pain.   Move your bowels when your body has the urge; this will require less straining and will decrease pain and pressure.

## 2016-03-04 NOTE — H&P (Signed)
Primary Care Physician:  Jani Gravel, MD Primary Gastroenterologist:  Dr. Oneida Alar  Pre-Procedure History & Physical: HPI:  Ellen Anderson is a 70 y.o. female here for DYSPHAGIA/screening    Past Medical History  Diagnosis Date  . Degenerative joint disease   . Chronic low back pain   . Hypertension   . Anemia   . Hip fracture, left ([place]) 03/12/2012  . PONV (postoperative nausea and vomiting)     Past Surgical History  Procedure Laterality Date  . Back surgery    . Ankle surgery Left     pins and screws  . Abdominal hysterectomy    . Cholecystectomy    . Appendectomy    . Hip surgery Bilateral     fractured bilateral hips  . Intramedullary (im) nail intertrochanteric Right 03/08/2013    Procedure: INTRAMEDULLARY (IM) NAIL INTERTROCHANTRIC;  Surgeon: Carole Civil, MD;  Location: [facility];  Service: Orthopedics;  Laterality: Right;  . Gastric bypass  1981  . Cataract extraction w/phaco Right 02/18/2015    Procedure: CATARACT EXTRACTION PHACO AND INTRAOCULAR LENS PLACEMENT RIGHT EYE; CDE:  10.48;  Surgeon: Tonny Branch, MD;  Location: [facility];  Service: Ophthalmology;  Laterality: Right;  . Cataract extraction w/phaco Left 03/07/2015    Procedure: CATARACT EXTRACTION PHACO AND INTRAOCULAR LENS PLACEMENT (IOC);  Surgeon: Tonny Branch, MD;  Location: [facility];  Service: Ophthalmology;  Laterality: Left;  CDE:7.14  . Esophagogastroduodenoscopy  07/23/2004    Dr. Rehman:foreign body impactedat cervical esophagus PORK CHOP, dilation of esophageal web  . Esophagogastroduodenoscopy  2011    Dr. Oneida Alar: food bolus which would not pass through gastric remnant into jejunostomy, PORK CHOP removed. AVOID PORK CHOPS    Prior to Admission medications   Medication Sig Start Date End Date Taking? Authorizing Provider  aspirin 325 MG tablet Take 325 mg by mouth daily as needed for moderate pain or headache.   Yes Historical Provider, MD  Calcium Carb-Cholecalciferol 600-500 MG-UNIT CAPS Take 1 capsule by  mouth 2 (two) times daily.   Yes Historical Provider, MD  diazepam (VALIUM) 10 MG tablet Take 1 tablet (10 mg total) by mouth every 8 (eight) hours as needed. For muscle spasms 03/10/13  Yes Carole Civil, MD  ergocalciferol (VITAMIN D2) 50000 units capsule Take 50,000 Units by mouth once a week. Every Tuesday.   Yes Historical Provider, MD  Na Sulfate-K Sulfate-Mg Sulf (SUPREP BOWEL PREP) SOLN Take 1 kit by mouth as directed. 02/07/16  Yes Danie Binder, MD  Propylene Glycol (SYSTANE BALANCE) 0.6 % SOLN Place 1 drop into both eyes daily as needed (dry eyes).   Yes Historical Provider, MD  denosumab (PROLIA) 60 MG/ML SOLN injection Inject 60 mg into the skin every 6 (six) months. Administer in upper arm, thigh, or abdomen    Historical Provider, MD  meperidine (DEMEROL) 50 MG tablet Take 1 tablet (50 mg total) by mouth every 4 (four) hours as needed for moderate pain. 02/24/16   Carole Civil, MD  promethazine (PHENERGAN) 12.5 MG tablet Take 1 tablet (12.5 mg total) by mouth every 6 (six) hours as needed for nausea. 02/24/16   Carole Civil, MD    Allergies as of 02/07/2016 - Review Complete 02/07/2016  Allergen Reaction Noted  . Amoxicillin-pot clavulanate Swelling 12/25/2011  . Ceclor [cefaclor] Swelling 12/25/2011  . Morphine and related Other (See Comments) 12/25/2011  . Sulfur Shortness Of Breath 12/25/2011  . Azithromycin Other (See Comments) 12/26/2011  . Ivp dye [iodinated diagnostic  agents] Other (See Comments) 12/25/2011  . Methadone Other (See Comments) 12/25/2011  . Percocet [oxycodone-acetaminophen] Itching 12/25/2011  . Betadine [povidone iodine] Rash 02/18/2015  . Codeine Hives and Rash 12/25/2011  . Escitalopram oxalate Other (See Comments) 12/25/2011  . Ibuprofen Hives and Rash 12/26/2011  . Tylenol [acetaminophen] Hives and Rash 12/26/2011    Family History  Problem Relation Age of Onset  . Throat cancer Mother   . COPD Father   . Stomach cancer Sister   .  COPD Sister   . Colon cancer Neg Hx   . Colon polyps Sister   . COPD Sister     Social History   Social History  . Marital Status: Widowed    Spouse Name: N/A  . Number of Children: N/A  . Years of Education: N/A   Occupational History  . Not on file.   Social History Main Topics  . Smoking status: Never Smoker   . Smokeless tobacco: Never Used  . Alcohol Use: No  . Drug Use: No  . Sexual Activity: No   Other Topics Concern  . Not on file   Social History Narrative    Review of Systems: See HPI, otherwise negative ROS   Physical Exam: BP 114/83 mmHg  Pulse 92  Temp(Src) 97.4 F (36.3 C) (Oral)  Resp 18  Ht _0  (1.575 m)  Wt 155 lb (70.308 kg)  BMI 28.34 kg/m2  SpO2 99% General:   Alert,  pleasant and cooperative in NAD Head:  Normocephalic and atraumatic. Neck:  Supple; Lungs:  Clear throughout to auscultation.    Heart:  Regular rate and rhythm. Abdomen:  Soft, nontender and nondistended. Normal bowel sounds, without guarding, and without rebound.   Neurologic:  Alert and  oriented x4;  grossly normal neurologically.  Impression/Plan:     DYSPHAGIA/screening  PLAN:  EGD/DIL/tcs TODAY

## 2016-03-04 NOTE — Progress Notes (Signed)
REVIEWED-NO ADDITIONAL RECOMMENDATIONS. 

## 2016-03-05 NOTE — Op Note (Signed)
[facility] Patient Name: Ellen Anderson Procedure Date: 03/04/2016 10:53 AM MRN: [id number] Date of Birth: [birth year] Attending MD: Barney Drain , MD CSN: [id number] Age: 70 Admit Type: Outpatient Procedure:                Upper GI endoscopy Indications:              Dysphagia, PSHx: GASTRIC BYPASS Providers:                Barney Drain, MD, Rosina Lowenstein, RN, Bonnetta Barry,                            Technician Referring MD:             Teodora Medici. Maudie Mercury (Referring MD) Medicines:                TCS+ Midazolam 2 mg IV Complications:            No immediate complications. Estimated Blood Loss:     Estimated blood loss: none. Procedure:                Pre-Anesthesia Assessment:                           - Prior to the procedure, a History and Physical                            was performed, and patient medications and                            allergies were reviewed. The patient's tolerance of                            previous anesthesia was also reviewed. The risks                            and benefits of the procedure and the sedation                            options and risks were discussed with the patient.                            All questions were answered, and informed consent                            was obtained. Prior Anticoagulants: The patient has                            taken aspirin, last dose was day of procedure. ASA                            Grade Assessment: II - A patient with mild systemic                            disease. After reviewing the risks and benefits,  the patient was deemed in satisfactory condition to                            undergo the procedure.                           After obtaining informed consent, the endoscope was                            passed under direct vision. Throughout the                            procedure, the patient's blood pressure, pulse, and                            oxygen  saturations were monitored continuously. The                            [id number]) scope was introduced through the                            mouth, and advanced to the afferent/efferent                            jejunal loop. The upper GI endoscopy was                            accomplished without difficulty. The patient                            tolerated the procedure well. Scope In: 10:57:56 AM Scope Out: 11:04:09 AM Total Procedure Duration: 0 hours 6 minutes 13 seconds  Findings:      A mild Schatzki ring (acquired) was found in the distal esophagus. A       guidewire was placed and the scope was withdrawn. Dilation was performed       with a Savary dilator with mild resistance at 16 mm and 17 mm.      The entire examined stomach was normal.      The examined jejunum was normal. Impression:               - Mild Schatzki ring. Dilated.                           - Normal stomach-5 CM GASTRIC POUCH.                           - Normal examined jejunum.                           - No specimens collected. Moderate Sedation:      Moderate (conscious) sedation was administered by the endoscopy nurse       and supervised by the endoscopist. The following parameters were       monitored: oxygen saturation, heart rate, blood pressure, and response       to care. Total physician intraservice time was  48 minutes. Recommendation:           - Patient has a contact number available for                            emergencies. The signs and symptoms of potential                            delayed complications were discussed with the                            patient. Return to normal activities tomorrow.                            Written discharge instructions were provided to the                            patient.                           - Resume previous diet.                           - Continue present medications.                           - Return to my office in 4  months. Procedure Code(s):        --- Professional ---                           [phone], Esophagogastroduodenoscopy, flexible,                            transoral; with insertion of guide wire followed by                            passage of dilator(s) through esophagus over guide                            wire                           99153, Moderate sedation services; each additional                            15 minutes intraservice time                           [id number], Moderate sedation services; each additional                            15 minutes intraservice time                           [id number], Moderate sedation services provided by the  same physician or other qualified health care                            professional performing a gastrointestinal                            endoscopic service that sedation supports,                            requiring the presence of an independent trained                            observer to assist in the monitoring of the                            patient's level of consciousness and physiological                            status; initial 15 minutes of intra-service time;                            patient age 34 years or older (additional time may                            be reported with [phone], as appropriate) Diagnosis Code(s):        --- Professional ---                           K22.2, Esophageal obstruction                           R13.10, Dysphagia, unspecified CPT copyright 2016 [employer]. All rights reserved. The codes documented in this report are preliminary and upon coder review may  be revised to meet current compliance requirements. Barney Drain, MD Barney Drain, MD 03/05/2016 4:54:06 PM This report has been signed electronically. Number of Addenda: 0

## 2016-03-05 NOTE — Op Note (Signed)
[facility] Patient Name: Ellen Anderson Procedure Date: 03/04/2016 9:50 AM MRN: [id number] Date of Birth: November 29, 1946 Attending MD: Barney Drain , MD CSN: [id number] Age: 70 Admit Type: Outpatient Procedure:                Colonoscopy Indications:              Screening for colorectal malignant neoplasm Providers:                Barney Drain, MD, Rosina Lowenstein, RN, Bonnetta Barry,                            Technician Referring MD:             Teodora Medici. Maudie Mercury (Referring MD) Medicines:                Promethazine 25 mg IV, Meperidine 125 mg IV,                            Midazolam 8 mg IV Complications:            No immediate complications. Estimated Blood Loss:     Estimated blood loss was minimal. Procedure:                Pre-Anesthesia Assessment:                           - Prior to the procedure, a History and Physical                            was performed, and patient medications and                            allergies were reviewed. The patient's tolerance of                            previous anesthesia was also reviewed. The risks                            and benefits of the procedure and the sedation                            options and risks were discussed with the patient.                            All questions were answered, and informed consent                            was obtained. Prior Anticoagulants: The patient has                            taken aspirin, last dose was day of procedure. ASA                            Grade Assessment: II - A patient with mild systemic  disease. After reviewing the risks and benefits,                            the patient was deemed in satisfactory condition to                            undergo the procedure.                           After obtaining informed consent, the colonoscope                            was passed under direct vision. Throughout the                            procedure,  the patient's blood pressure, pulse, and                            oxygen saturations were monitored continuously. The                            [id number] scope was introduced through                            the anus and advanced to the the cecum, identified                            by appendiceal orifice and ileocecal valve. The                            ileocecal valve, appendiceal orifice, and rectum                            were photographed. The colonoscopy was technically                            difficult and complex due to significant looping.                            The quality of the bowel preparation was excellent.                            The ileocecal valve, appendiceal orifice, and                            rectum were photographed. The colonoscopy was                            technically difficult and complex due to                            significant looping and the patient's agitation.  The patient tolerated the procedure fairly well. Scope In: 10:16:16 AM Scope Out: 10:52:28 AM Scope Withdrawal Time: 0 hours 15 minutes 13 seconds  Total Procedure Duration: 0 hours 36 minutes 12 seconds  Findings:      A 6 mm polyp was found in the proximal ascending colon. The polyp was       flat. The polyp was removed with a hot snare. Resection was complete,       but the polyp tissue was not retrieved. Estimated blood loss was minimal.      Two sessile polyps were found in the rectum. The polyps were 2 mm in       size. These polyps were removed with a cold biopsy forceps. Resection       and retrieval were complete. Estimated blood loss was minimal.      Non-bleeding internal hemorrhoids were found. The hemorrhoids were       medium-sized. Impression:               - Three polyps.                           - Hemorrhoids.                           - No specimens collected. Moderate Sedation:      Moderate (conscious)  sedation was administered by the endoscopy nurse       and supervised by the endoscopist. The following parameters were       monitored: oxygen saturation, heart rate, blood pressure, and response       to care. Total physician intraservice time was 48 minutes. Recommendation:           - Patient has a contact number available for                            emergencies. The signs and symptoms of potential                            delayed complications were discussed with the                            patient. Return to normal activities tomorrow.                            Written discharge instructions were provided to the                            patient.                           - High fiber diet.                           - Continue present medications.                           - Await pathology results.                           - Repeat colonoscopy  in 5-10 years for surveillance. Procedure Code(s):        --- Professional ---                           [phone], Colonoscopy, flexible; with removal of                            tumor(s), polyp(s), or other lesion(s) by snare                            technique                           45380, 59, Colonoscopy, flexible; with biopsy,                            single or multiple                           99153, Moderate sedation services; each additional                            15 minutes intraservice time                           99153, Moderate sedation services; each additional                            15 minutes intraservice time                           G0500, Moderate sedation services provided by the                            same physician or other qualified health care                            professional performing a gastrointestinal                            endoscopic service that sedation supports,                            requiring the presence of an independent trained                             observer to assist in the monitoring of the                            patient's level of consciousness and physiological                            status; initial 15 minutes of intra-service time;                            patient age 43 years or older (additional  time may                            be reported with [phone], as appropriate) Diagnosis Code(s):        --- Professional ---                           Z12.11, Encounter for screening for malignant                            neoplasm of colon                           D12.6, Benign neoplasm of colon, unspecified                           K64.9, Unspecified hemorrhoids CPT copyright 2016 American [employer] Association. All rights reserved. The codes documented in this report are preliminary and upon coder review may  be revised to meet current compliance requirements. Barney Drain, MD Barney Drain, MD 03/05/2016 4:48:01 PM This report has been signed electronically. Number of Addenda: 0

## 2016-03-06 ENCOUNTER — Encounter (HOSPITAL_COMMUNITY): Payer: Self-pay | Admitting: Gastroenterology

## 2016-03-22 ENCOUNTER — Telehealth: Payer: Self-pay | Admitting: Gastroenterology

## 2016-03-22 NOTE — Telephone Encounter (Signed)
Please call pt. She had ONE simple adenoma AND TWO BENIGN POLYPS removed. Please call pt. HER stomach Bx shows mild gastritis.  YOUR ESOPHAGUS WAS STRETCHED DUE to a YOUR COMPLAINT OF DYSPHAGIA. YOUR POUCH IS ONLY 5 CM AND YOUR DIFFICULTY SWALLOWING IS MOST LIKELY DUE TO A FOOD BOLUS THAT IS STUCK IN YOUR POUCH BUT EVENTUALLY MOVES ON.     DRINK WATER TO KEEP YOUR URINE LIGHT YELLOW.  FOLLOW A HIGH FIBER DIET. AVOID ITEMS THAT CAUSE BLOATING.   REDUCE YOUR ASPIRIN DOSE TO 81 MG DAILY TO DECREASE THE RISK OF A GI BLEED DUE TO ASPIRIN USE.  Follow up in 4 mos E30 DYSPHAGIA/GAS.  Next colonoscopy in 5-10 years.

## 2016-03-23 NOTE — Telephone Encounter (Signed)
APPT MADE AND ON RECALL  °

## 2016-03-23 NOTE — Telephone Encounter (Signed)
Pt is aware of results. 

## 2016-04-23 ENCOUNTER — Telehealth: Payer: Self-pay

## 2016-04-23 ENCOUNTER — Other Ambulatory Visit: Payer: Self-pay | Admitting: Gastroenterology

## 2016-04-23 MED ORDER — OMEPRAZOLE 20 MG PO CPDR: 20.0000 mg | DELAYED_RELEASE_CAPSULE | Freq: Every day | ORAL | Status: DC

## 2016-04-23 MED ORDER — LUBIPROSTONE 24 MCG PO CAPS: 24.0000 ug | ORAL_CAPSULE | Freq: Two times a day (BID) | ORAL | Status: DC

## 2016-04-23 NOTE — Telephone Encounter (Signed)
Pt called and said she has not had a BM in 9 days. She has taken a box of stool softners. Dulolax, and 2 fleet enemas. She is not having any pain. No bloating.  She does have some indigestion also.  Her neck has been sore some since her procedure. Sending to Neil Crouch, PA in Dr. Oneida Alar absence.

## 2016-04-23 NOTE — Telephone Encounter (Signed)
Let's try amitiza for constipation and omeprazole for indigestion. rx sent for both. She should have upcoming ov in 06/2016. She could also use a fleet enema today for some immediate relief.

## 2016-04-23 NOTE — Telephone Encounter (Signed)
Pt is aware.  

## 2016-04-29 NOTE — Telephone Encounter (Signed)
Tried to do a PA for [employer]. PA has been denied because the pt has not tried and failed linzess.

## 2016-05-26 ENCOUNTER — Ambulatory Visit (INDEPENDENT_AMBULATORY_CARE_PROVIDER_SITE_OTHER): Payer: Medicare Other | Admitting: Orthopedic Surgery

## 2016-05-26 DIAGNOSIS — S72141D Displaced intertrochanteric fracture of right femur, subsequent encounter for closed fracture with routine healing: Secondary | ICD-10-CM

## 2016-05-26 DIAGNOSIS — M7072 Other bursitis of hip, left hip: Secondary | ICD-10-CM

## 2016-05-26 MED ORDER — MEPERIDINE HCL 50 MG PO TABS: 50.0000 mg | ORAL_TABLET | ORAL | Status: DC | PRN

## 2016-05-26 NOTE — Progress Notes (Signed)
Follow-up visit for Ellen Anderson recheck left hip chronic pain chronic back pain  She's noted that the insurer has denied her Demerol. She is allergic to codeine's and related products and has been on Demerol for years even prior to my meeting her for hip fracture. This is a only medicine that controls her chronic lumbar disease and chronic hip pain  She would also Anderson an injection in her left hip  Procedure note injection for   left hip bursitis  Verbal consent was obtained for injection of the  left hip  Timeout was completed to confirm the injection site  The medications used were 80 mg of Depo-Medrol and 1% lidocaine 3 cc  Anesthesia was provided by ethyl chloride and the skin was prepped with alcohol.  After cleaning the skin with alcohol a 25-gauge needle was used to inject the  left bursa of the hip  Recheck 3 months  Refill Demerol

## 2016-05-26 NOTE — Patient Instructions (Signed)

## 2016-05-28 ENCOUNTER — Telehealth: Payer: Self-pay | Admitting: Orthopedic Surgery

## 2016-05-28 ENCOUNTER — Other Ambulatory Visit: Payer: Self-pay | Admitting: *Deleted

## 2016-05-28 DIAGNOSIS — R110 Nausea: Secondary | ICD-10-CM

## 2016-05-28 MED ORDER — PROMETHAZINE HCL 12.5 MG PO TABS: 12.5000 mg | ORAL_TABLET | Freq: Four times a day (QID) | ORAL | Status: DC | PRN

## 2016-05-28 NOTE — Telephone Encounter (Signed)
She wanted to know if she can get Phenergan 12.5  Qty 90  Sig: Take 1 tablet (12.5 mg total) by mouth every 6 (six) hours as needed for nausea.

## 2016-05-28 NOTE — Telephone Encounter (Signed)
Routing to Dr Harrison for approval 

## 2016-05-28 NOTE — Telephone Encounter (Signed)
Sent to patient's pharmacy.

## 2016-05-28 NOTE — Telephone Encounter (Signed)
YES

## 2016-06-01 NOTE — Telephone Encounter (Signed)
Patient made aware per phone 05/28/16.

## 2016-06-21 ENCOUNTER — Other Ambulatory Visit: Payer: Self-pay | Admitting: Gastroenterology

## 2016-07-01 ENCOUNTER — Telehealth: Payer: Self-pay | Admitting: Gastroenterology

## 2016-07-01 NOTE — Telephone Encounter (Signed)
PATIENT CALLED STATING THAT SHE IS CHANGING HER PHARMACY TO [facility] IN [place] SO ANY SCRIPTS NEED TO BE THERE

## 2016-07-01 NOTE — Telephone Encounter (Signed)
I have changed the pharmacy to [facility] in [place].

## 2016-07-06 ENCOUNTER — Ambulatory Visit (INDEPENDENT_AMBULATORY_CARE_PROVIDER_SITE_OTHER): Payer: Medicare Other | Admitting: Gastroenterology

## 2016-07-06 ENCOUNTER — Encounter: Payer: Self-pay | Admitting: Gastroenterology

## 2016-07-06 VITALS — BP 114/79 | HR 76 | Temp 97.4°F | Ht 62.0 in | Wt 154.0 lb

## 2016-07-06 DIAGNOSIS — R1310 Dysphagia, unspecified: Secondary | ICD-10-CM | POA: Diagnosis not present

## 2016-07-06 DIAGNOSIS — K5900 Constipation, unspecified: Secondary | ICD-10-CM | POA: Insufficient documentation

## 2016-07-06 NOTE — Patient Instructions (Addendum)
Take Prilosec each morning, 30 minutes breakfast (on an empty stomach).   We will see you back in 1 year.   Continue to chew really well as you are doing.   I have given you samples of Linzess, a constipation medicine. Take this 1 capsule each morning on an empty stomach. If you like this, please call the office, and I will send in a prescription.

## 2016-07-06 NOTE — Progress Notes (Signed)
Referring Provider: Jani Gravel, MD Primary Care Physician:  Jani Gravel, MD  Primary GI: Dr. Oneida Alar   Chief Complaint  Patient presents with  . Follow-up    doing ok    HPI:   Ellen Anderson is a 70 y.o. female presenting today with a history of gastric bypass in the remote past, returning in follow-up after EGD for history of dysphagia and food impaction. Dilation of mild Schatzki's ring at time of EGD with normal stomach, small pouch. Next colonoscopy in 5-10 years.   Takes a stool softener every day. Will sometimes take a dulcolax if needed. Interested in trying in something else. Chewing well with meats. No dysphagia. No abdominal pain. No rectal bleeding.    Past Medical History:  Diagnosis Date  . Anemia   . Chronic low back pain   . Degenerative joint disease   . Hip fracture, left ([place]) 03/12/2012  . Hypertension   . PONV (postoperative nausea and vomiting)     Past Surgical History:  Procedure Laterality Date  . ABDOMINAL HYSTERECTOMY    . ANKLE SURGERY Left    pins and screws  . APPENDECTOMY    . BACK SURGERY    . CATARACT EXTRACTION W/PHACO Right 02/18/2015   Procedure: CATARACT EXTRACTION PHACO AND INTRAOCULAR LENS PLACEMENT RIGHT EYE; CDE:  10.48;  Surgeon: Tonny Branch, MD;  Location: [facility];  Service: Ophthalmology;  Laterality: Right;  . CATARACT EXTRACTION W/PHACO Left 03/07/2015   Procedure: CATARACT EXTRACTION PHACO AND INTRAOCULAR LENS PLACEMENT (IOC);  Surgeon: Tonny Branch, MD;  Location: [facility];  Service: Ophthalmology;  Laterality: Left;  CDE:7.14  . CHOLECYSTECTOMY    . COLONOSCOPY N/A 03/04/2016   Procedure: COLONOSCOPY;  Surgeon: Danie Binder, MD;  Location: [facility];  Service: Endoscopy;  Laterality: N/A;  930   . ESOPHAGOGASTRODUODENOSCOPY  07/23/2004   Dr. Rehman:foreign body impactedat cervical esophagus PORK CHOP, dilation of esophageal web  . ESOPHAGOGASTRODUODENOSCOPY  2011   Dr. Oneida Alar: food bolus which would not pass through gastric  remnant into jejunostomy, PORK CHOP removed. AVOID PORK CHOPS  . ESOPHAGOGASTRODUODENOSCOPY N/A 03/04/2016   Procedure: ESOPHAGOGASTRODUODENOSCOPY (EGD);  Surgeon: Danie Binder, MD;  Location: [facility];  Service: Endoscopy;  Laterality: N/A;  . GASTRIC BYPASS  1981  . HIP SURGERY Bilateral    fractured bilateral hips  . INTRAMEDULLARY (IM) NAIL INTERTROCHANTERIC Right 03/08/2013   Procedure: INTRAMEDULLARY (IM) NAIL INTERTROCHANTRIC;  Surgeon: Carole Civil, MD;  Location: [facility];  Service: Orthopedics;  Laterality: Right;  . SAVORY DILATION N/A 03/04/2016   Procedure: SAVORY DILATION;  Surgeon: Danie Binder, MD;  Location: [facility];  Service: Endoscopy;  Laterality: N/A;    Current Outpatient Prescriptions  Medication Sig Dispense Refill  . aspirin 325 MG tablet Take 325 mg by mouth daily as needed for moderate pain or headache.    . Calcium Carb-Cholecalciferol 600-500 MG-UNIT CAPS Take 1 capsule by mouth 2 (two) times daily.    Marland Kitchen denosumab (PROLIA) 60 MG/ML SOLN injection Inject 60 mg into the skin every 6 (six) months. Administer in upper arm, thigh, or abdomen    . diazepam (VALIUM) 10 MG tablet Take 1 tablet (10 mg total) by mouth every 8 (eight) hours as needed. For muscle spasms 30 tablet 0  . docusate sodium (COLACE) 100 MG capsule Take 100 mg by mouth 2 (two) times daily.    . ergocalciferol (VITAMIN D2) 50000 units capsule Take 50,000 Units by mouth once  a week. Every Tuesday.    . meperidine (DEMEROL) 50 MG tablet Take 1 tablet (50 mg total) by mouth every 4 (four) hours as needed for moderate pain. 90 tablet 0  . omeprazole (PRILOSEC) 20 MG capsule TAKE 1 CAPSULE(20 MG) BY MOUTH DAILY BEFORE BREAKFAST 30 capsule 2  . promethazine (PHENERGAN) 12.5 MG tablet Take 1 tablet (12.5 mg total) by mouth every 6 (six) hours as needed for nausea. 90 tablet 0  . Propylene Glycol (SYSTANE BALANCE) 0.6 % SOLN Place 1 drop into both eyes daily as needed (dry eyes).     No  current facility-administered medications for this visit.     Allergies as of 07/06/2016 - Review Complete 07/06/2016  Allergen Reaction Noted  . Amoxicillin-pot clavulanate Swelling 12/25/2011  . Ceclor [cefaclor] Swelling 12/25/2011  . Morphine and related Other (See Comments) 12/25/2011  . Sulfur Shortness Of Breath 12/25/2011  . Azithromycin Other (See Comments) 12/26/2011  . Flexeril [cyclobenzaprine] Hives and Swelling 02/20/2016  . Ivp dye [iodinated diagnostic agents] Other (See Comments) 12/25/2011  . Methadone Other (See Comments) 12/25/2011  . Percocet [oxycodone-acetaminophen] Itching 12/25/2011  . Betadine [povidone iodine] Rash 02/18/2015  . Codeine Hives and Rash 12/25/2011  . Escitalopram oxalate Other (See Comments) 12/25/2011  . Ibuprofen Hives and Rash 12/26/2011  . Tylenol [acetaminophen] Hives and Rash 12/26/2011    Family History  Problem Relation Age of Onset  . Throat cancer Mother   . COPD Father   . Stomach cancer Sister   . COPD Sister   . Colon polyps Sister   . COPD Sister   . Colon cancer Neg Hx     Social History   Social History  . Marital status: Widowed    Spouse name: N/A  . Number of children: N/A  . Years of education: N/A   Social History Main Topics  . Smoking status: Never Smoker  . Smokeless tobacco: Never Used  . Alcohol use No  . Drug use: No  . Sexual activity: No   Other Topics Concern  . None   Social History Narrative  . None    Review of Systems: As mentioned in HPI.   Physical Exam: BP 114/79   Pulse 76   Temp 97.4 F (36.3 C) (Oral)   Ht 5\' 2"  (1.575 m)   Wt 154 lb (69.9 kg)   BMI 28.17 kg/m  General:   Alert and oriented. No distress noted. Pleasant and cooperative.  Head:  Normocephalic and atraumatic. Eyes:  Conjuctiva clear without scleral icterus Abdomen:  +BS, soft, non-tender and non-distended. Msk:  Symmetrical without gross deformities. Normal posture. Extremities:  Without  edema. Neurologic:  Alert and  oriented x4 Psych:  Alert and cooperative. Normal mood and affect.

## 2016-07-06 NOTE — Assessment & Plan Note (Signed)
Trial of Linzess 72 mcg once daily. Patient to call if this works well.

## 2016-07-06 NOTE — Progress Notes (Signed)
cc'ed to pcp °

## 2016-07-06 NOTE — Assessment & Plan Note (Signed)
Resolved s/p dilation of Schatzki's ring. History of gastric bypass likely playing a role as well. Discussed chewing well, taking small bites, continuing Prilosec once daily. Return in 1 year or sooner if needed.

## 2016-07-14 ENCOUNTER — Ambulatory Visit (HOSPITAL_COMMUNITY)
Admission: RE | Admit: 2016-07-14 | Discharge: 2016-07-14 | Disposition: A | Discharge status: Home / Self Care | Payer: Medicare Other | Source: Ambulatory Visit | Attending: Internal Medicine | Admitting: Internal Medicine

## 2016-07-14 DIAGNOSIS — I358 Other nonrheumatic aortic valve disorders: Secondary | ICD-10-CM | POA: Diagnosis not present

## 2016-07-14 DIAGNOSIS — I421 Obstructive hypertrophic cardiomyopathy: Secondary | ICD-10-CM | POA: Insufficient documentation

## 2016-07-14 DIAGNOSIS — I517 Cardiomegaly: Secondary | ICD-10-CM | POA: Insufficient documentation

## 2016-07-14 LAB — ECHOCARDIOGRAM COMPLETE
CHL CUP DOP CALC LVOT VTI: 14.9 cm
CHL CUP MV DEC (S): 240
CHL CUP STROKE VOLUME: 27 mL
EERAT: 3.97
EWDT: 240 ms
FS: 37 % (ref 28–44)
IV/PV OW: 0.99
LA ID, A-P, ES: 21 mm
LA diam index: 1.19 cm/m2
LA vol A4C: 34.2 ml
LA vol: 32.9 mL
LAVOLIN: 18.6 mL/m2
LEFT ATRIUM END SYS DIAM: 21 mm
LV E/e'average: 3.97
LV PW d: 11.5 mm — AB (ref 0.6–1.1)
LV sys vol index: 12 mL/m2
LV sys vol: 20 mL (ref 14–42)
LVDIAVOL: 48 mL (ref 46–106)
LVDIAVOLIN: 27 mL/m2
LVEEMED: 3.97
LVELAT: 11.5 cm/s
LVOT area: 2.84 cm2
LVOT peak grad rest: 3 mmHg
LVOTD: 19 mm
LVOTPV: 82 cm/s
LVOTSV: 42 mL
MV pk A vel: 85.4 m/s
MV pk E vel: 45.6 m/s
Simpson's disk: 57
TAPSE: 15.7 mm
TDI e' lateral: 11.5
TDI e' medial: 8.27

## 2016-07-14 NOTE — Progress Notes (Signed)
*  PRELIMINARY RESULTS* Echocardiogram 2D Echocardiogram has been performed.  Samuel Germany 07/14/2016, 11:18 AM

## 2016-07-28 ENCOUNTER — Telehealth: Payer: Self-pay | Admitting: Gastroenterology

## 2016-07-28 NOTE — Telephone Encounter (Signed)
Pt called asking to speak with AB. I told her that AB was seeing patients and I could take a message for her. Pt said that she wanted to speak with AB about her medications. Please call her at [phone]

## 2016-07-29 NOTE — Telephone Encounter (Signed)
I called pt and she has tried the Linzess 72 mcg and it worked great, but she said the prescription was going to cost over $400.00. I told her I will leave her some samples now and let Vicente Males know.   I have left her 2 bottles of the Linzess 72 mcg at front for pick up.   Routing to [place] for recommendations.

## 2016-07-31 NOTE — Telephone Encounter (Signed)
We could trial Amitiza 8 mcg po BID and see if this is covered by the pharmacy. If she would like this sent to the pharmacy to see if covered better, you can call in to take 1 po BID, disp #60 with 3 refills.

## 2016-07-31 NOTE — Telephone Encounter (Signed)
Pt is aware and I have called the Rx to [facility] prescription line and LMOM.

## 2016-08-03 ENCOUNTER — Other Ambulatory Visit: Payer: Self-pay

## 2016-08-03 NOTE — Telephone Encounter (Signed)
Open in error

## 2016-08-11 ENCOUNTER — Telehealth: Payer: Self-pay | Admitting: Orthopedic Surgery

## 2016-08-11 NOTE — Telephone Encounter (Signed)
Patient called asking if our office ever faxed information in to her insurance about her only being able to take Demerol for pain. She is asking only because her insurance denied it in the past and it costed her over a hundred dollars out of pocket.

## 2016-08-12 NOTE — Telephone Encounter (Signed)
States pharmacy is supposed to send form for our office to fill out and fax back to them. I didn't see anything scanned in , but I advised they may have put in Dr. Ruthe Mannan box to fill out. Advised for her to check back next week.

## 2016-08-24 ENCOUNTER — Ambulatory Visit (INDEPENDENT_AMBULATORY_CARE_PROVIDER_SITE_OTHER): Payer: Medicare Other | Admitting: Orthopedic Surgery

## 2016-08-24 ENCOUNTER — Encounter: Payer: Self-pay | Admitting: Orthopedic Surgery

## 2016-08-24 ENCOUNTER — Telehealth: Payer: Self-pay | Admitting: Orthopedic Surgery

## 2016-08-24 ENCOUNTER — Other Ambulatory Visit: Payer: Self-pay | Admitting: *Deleted

## 2016-08-24 VITALS — BP 101/69 | HR 74 | Ht 63.0 in | Wt 156.0 lb

## 2016-08-24 DIAGNOSIS — M5137 Other intervertebral disc degeneration, lumbosacral region: Secondary | ICD-10-CM

## 2016-08-24 DIAGNOSIS — M549 Dorsalgia, unspecified: Secondary | ICD-10-CM

## 2016-08-24 DIAGNOSIS — G8929 Other chronic pain: Secondary | ICD-10-CM

## 2016-08-24 DIAGNOSIS — M7072 Other bursitis of hip, left hip: Secondary | ICD-10-CM | POA: Diagnosis not present

## 2016-08-24 DIAGNOSIS — S72141D Displaced intertrochanteric fracture of right femur, subsequent encounter for closed fracture with routine healing: Secondary | ICD-10-CM

## 2016-08-24 DIAGNOSIS — R110 Nausea: Secondary | ICD-10-CM

## 2016-08-24 DIAGNOSIS — M25552 Pain in left hip: Secondary | ICD-10-CM

## 2016-08-24 DIAGNOSIS — G894 Chronic pain syndrome: Secondary | ICD-10-CM

## 2016-08-24 MED ORDER — PROMETHAZINE HCL 12.5 MG PO TABS: 12.5000 mg | ORAL_TABLET | Freq: Four times a day (QID) | ORAL | 0 refills | Status: DC | PRN

## 2016-08-24 MED ORDER — MEPERIDINE HCL 50 MG PO TABS: 50.0000 mg | ORAL_TABLET | ORAL | 0 refills | Status: DC | PRN

## 2016-08-24 NOTE — Patient Instructions (Signed)

## 2016-08-24 NOTE — Telephone Encounter (Signed)
Patient was seen this afternoon and called back afterwards.  She said she took her prescription for Demerol to [facility].  She was told that they didn't have this medication on hand and it could possibly take up to 3 months to get it.  She was upset by this.  She has spoken with someone at her insurance company and was told that our office would possibly fax them the prescription and they could send it through the mail.  She was unsure of anything specific on how this works.  She said that we need to call and speak to Mr. August at [phone].    Thanks

## 2016-08-24 NOTE — Addendum Note (Signed)
Addended by: Carole Civil on: 08/24/2016 03:19 PM   Modules accepted: Orders

## 2016-08-24 NOTE — Progress Notes (Signed)
Chief Complaint  Patient presents with  . Follow-up    3 month follow up left hip, repeat injection   BP 101/69   Pulse 74   Ht 5\' 3"  (1.6 m)   Wt 156 lb (70.8 kg)   BMI 27.63 kg/m   Refill pain medication   Intramuscular shot left hip patient complains of back pain radicular pain to her left hip and upper left leg  Has a chronic history of chronic pain lumbar disc disease. She does not want to go see a neurosurgeon because they've told her in the past they can't operate  Inject IM steroid 40 mg left hip Alcohol prep and ethyl chloride for skin anesthesia 25-gauge needle intramuscular left hip  Follow-up 3 months

## 2016-08-24 NOTE — Addendum Note (Signed)
Addended by: Arther Abbott E on: 08/24/2016 03:20 PM   Modules accepted: Orders

## 2016-08-24 NOTE — Telephone Encounter (Signed)
PLEASE ADVISE HER TO CHECK WITH OTHER PHARMACIES

## 2016-08-27 ENCOUNTER — Ambulatory Visit: Payer: Medicare Other | Admitting: Orthopedic Surgery

## 2016-09-15 ENCOUNTER — Other Ambulatory Visit (HOSPITAL_COMMUNITY): Payer: Self-pay | Admitting: Internal Medicine

## 2016-09-15 DIAGNOSIS — Z1231 Encounter for screening mammogram for malignant neoplasm of breast: Secondary | ICD-10-CM

## 2016-09-17 ENCOUNTER — Ambulatory Visit (HOSPITAL_COMMUNITY): Payer: Medicare Other

## 2016-10-05 ENCOUNTER — Other Ambulatory Visit: Payer: Self-pay | Admitting: Nurse Practitioner

## 2016-10-06 ENCOUNTER — Other Ambulatory Visit: Payer: Self-pay | Admitting: Nurse Practitioner

## 2016-10-08 ENCOUNTER — Telehealth: Payer: Self-pay | Admitting: Orthopedic Surgery

## 2016-11-03 ENCOUNTER — Other Ambulatory Visit: Payer: Self-pay | Admitting: *Deleted

## 2016-11-03 ENCOUNTER — Telehealth: Payer: Self-pay | Admitting: Orthopedic Surgery

## 2016-11-03 DIAGNOSIS — S72141D Displaced intertrochanteric fracture of right femur, subsequent encounter for closed fracture with routine healing: Secondary | ICD-10-CM

## 2016-11-03 DIAGNOSIS — R110 Nausea: Secondary | ICD-10-CM

## 2016-11-03 MED ORDER — MEPERIDINE HCL 50 MG PO TABS: 50.0000 mg | ORAL_TABLET | ORAL | 0 refills | Status: DC | PRN

## 2016-11-03 MED ORDER — PROMETHAZINE HCL 12.5 MG PO TABS: 12.5000 mg | ORAL_TABLET | Freq: Four times a day (QID) | ORAL | 0 refills | Status: DC | PRN

## 2016-11-03 NOTE — Telephone Encounter (Signed)
Patient requests refill on Meperidine(Demerol) 50 mgs.   Qty  90  Sig: Take 1 tablet (50 mg total) by mouth every 4 (four) hours as needed for moderate pain.

## 2016-11-03 NOTE — Telephone Encounter (Signed)
Patient requests refill on Phenergan 12.5  Qty  90   Sig: Take 1 tablet (12.5 mg total) by mouth every 6 (six) hours as needed for nausea.

## 2016-11-03 NOTE — Telephone Encounter (Signed)
YES

## 2016-11-03 NOTE — Telephone Encounter (Signed)
ROUTING TO DR HARRISON 

## 2016-11-03 NOTE — Telephone Encounter (Signed)
ROUTING TO DR HARRISON FOR APPROVAL 

## 2016-11-10 ENCOUNTER — Other Ambulatory Visit: Payer: Self-pay | Admitting: Nurse Practitioner

## 2016-11-23 ENCOUNTER — Ambulatory Visit (INDEPENDENT_AMBULATORY_CARE_PROVIDER_SITE_OTHER): Payer: Medicare Other | Admitting: Orthopedic Surgery

## 2016-11-23 ENCOUNTER — Encounter: Payer: Self-pay | Admitting: Orthopedic Surgery

## 2016-11-23 DIAGNOSIS — S72141D Displaced intertrochanteric fracture of right femur, subsequent encounter for closed fracture with routine healing: Secondary | ICD-10-CM

## 2016-11-23 DIAGNOSIS — M7062 Trochanteric bursitis, left hip: Secondary | ICD-10-CM

## 2016-11-23 DIAGNOSIS — M25552 Pain in left hip: Secondary | ICD-10-CM

## 2016-11-23 DIAGNOSIS — G8929 Other chronic pain: Secondary | ICD-10-CM | POA: Diagnosis not present

## 2016-11-23 DIAGNOSIS — R110 Nausea: Secondary | ICD-10-CM

## 2016-11-23 MED ORDER — MEPERIDINE HCL 50 MG PO TABS: 50.0000 mg | ORAL_TABLET | ORAL | 0 refills | Status: DC | PRN

## 2016-11-23 MED ORDER — PROMETHAZINE HCL 12.5 MG PO TABS: 12.5000 mg | ORAL_TABLET | Freq: Four times a day (QID) | ORAL | 0 refills | Status: DC | PRN

## 2016-11-23 NOTE — Progress Notes (Signed)
Patient ID: EDEL DEGRAVE, female   DOB: Jun 11, 1946, 70 y.o.   MRN: [id number]  Chief Complaint  Patient presents with  . Follow-up    LEFT HIP     HPI Ellen Anderson is a 70 y.o. female.   HPI  70 year old female status post internal fixation left hip with gamma nail. Continues to complain of left hip pain. History of failed back syndrome. Chronic pain relieved by Demerol with Phenergan.  Patient says she can't give urine today.  She would like her hip reevaluated. She complains of increasing pain over the left hip between the greater trochanter in the lumbar spine. She says it doesn't feel like her back pain feels different.  Review of Systems Review of Systems Past Medical History:  Diagnosis Date  . Anemia   . Chronic low back pain   . Degenerative joint disease   . Hip fracture, left ([place]) 03/12/2012  . Hypertension   . PONV (postoperative nausea and vomiting)    Past Surgical History:  Procedure Laterality Date  . ABDOMINAL HYSTERECTOMY    . ANKLE SURGERY Left    pins and screws  . APPENDECTOMY    . BACK SURGERY    . CATARACT EXTRACTION W/PHACO Right 02/18/2015   Procedure: CATARACT EXTRACTION PHACO AND INTRAOCULAR LENS PLACEMENT RIGHT EYE; CDE:  10.48;  Surgeon: Tonny Branch, MD;  Location: [facility];  Service: Ophthalmology;  Laterality: Right;  . CATARACT EXTRACTION W/PHACO Left 03/07/2015   Procedure: CATARACT EXTRACTION PHACO AND INTRAOCULAR LENS PLACEMENT (IOC);  Surgeon: Tonny Branch, MD;  Location: [facility];  Service: Ophthalmology;  Laterality: Left;  CDE:7.14  . CHOLECYSTECTOMY    . COLONOSCOPY N/A 03/04/2016   Procedure: COLONOSCOPY;  Surgeon: Danie Binder, MD;  Location: [facility];  Service: Endoscopy;  Laterality: N/A;  930   . ESOPHAGOGASTRODUODENOSCOPY  07/23/2004   Dr. Rehman:foreign body impactedat cervical esophagus PORK CHOP, dilation of esophageal web  . ESOPHAGOGASTRODUODENOSCOPY  2011   Dr. Oneida Alar: food bolus which would not pass through gastric remnant  into jejunostomy, PORK CHOP removed. AVOID PORK CHOPS  . ESOPHAGOGASTRODUODENOSCOPY N/A 03/04/2016   Procedure: ESOPHAGOGASTRODUODENOSCOPY (EGD);  Surgeon: Danie Binder, MD;  Location: [facility];  Service: Endoscopy;  Laterality: N/A;  . GASTRIC BYPASS  1981  . HIP SURGERY Bilateral    fractured bilateral hips  . INTRAMEDULLARY (IM) NAIL INTERTROCHANTERIC Right 03/08/2013   Procedure: INTRAMEDULLARY (IM) NAIL INTERTROCHANTRIC;  Surgeon: Carole Civil, MD;  Location: [facility];  Service: Orthopedics;  Laterality: Right;  . SAVORY DILATION N/A 03/04/2016   Procedure: SAVORY DILATION;  Surgeon: Danie Binder, MD;  Location: [facility];  Service: Endoscopy;  Laterality: N/A;     She denies numbness or tingling in the left leg complains of hip pain and mild back pain. Bowel and bladder function are intact  Physical Exam  Constitutional: She is oriented to person, place, and time. She appears well-developed and well-nourished. No distress.  Cardiovascular: Normal rate and intact distal pulses.   Neurological: She is alert and oriented to person, place, and time. She has normal reflexes. She exhibits normal muscle tone. Coordination normal.  Skin: Skin is warm and dry. No rash noted. She is not diaphoretic. No erythema. No pallor.  Psychiatric: She has a normal mood and affect. Her behavior is normal. Judgment and thought content normal.    She continues to have tenderness in her lower back but says that this is not the pain she is concerned  about. She has palpable tenderness over the lumbar spine and left buttock and left greater trochanter. The pain over the trochanter is mild to moderate the pain in the back is moderate to severe area  Her hip range of motion remains normal, leg lengths remain normal. Muscle tone in the left hip normal. Mild weakness in abduction. No instability detected on flexion extension internal/external rotation of the left hip. Skin incision nontender no erythema.  Negative Tinel's at the incision.  Right hip range of motion stability strength and alignment normal skin normal.   MEDICAL DECISION MAKING  DATA   Last MRI was in 2006 IMPRESSION:  1.  Interval vertebroplasty at L3, L4, and L5 since 02/09/02.  Compression deformities at those levels appear stable, and no acute fractures are demonstrated. 2.  Interval near complete resolution of previously demonstrated postoperative changes at L4-5.  There is residual left-greater-than-right foraminal stenosis with possible encroachment on the left L4 nerve root. 3.  Annular disc bulging at L2-3 and L3-4 appears stable without focal disc protrusion or nerve root encroachment.   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  . Encounter for chronic pain management   . Intertrochanteric fracture of right hip, closed, with routine healing, subsequent encounter   . Nausea without vomiting   . Chronic left hip pain Yes  . Trochanteric bursitis of left hip      PLAN(RISK)    Plan inject left hip Verbal consent was given site confirmation by timeout. 40 mg of Depo-Medrol 3 mL 1% lidocaine injected just proximal to the greater trochanter at the point of maximal tenderness occasions were noted  Meds ordered this encounter  Medications  . meperidine (DEMEROL) 50 MG tablet    Sig: Take 1 tablet (50 mg total) by mouth every 4 (four) hours as needed for moderate pain.    Dispense:  90 tablet    Refill:  0  . promethazine (PHENERGAN) 12.5 MG tablet    Sig: Take 1 tablet (12.5 mg total) by mouth every 6 (six) hours as needed for nausea.    Dispense:  90 tablet    Refill:  0   Follow-up as needed  Patient will not be able to get another prescription without her drug screen which she said she could not do today because she  didn't have any urine. Starting January 1 no exceptions will be made.

## 2016-12-26 ENCOUNTER — Other Ambulatory Visit: Payer: Self-pay | Admitting: Gastroenterology

## 2017-05-10 ENCOUNTER — Encounter: Payer: Self-pay | Admitting: Gastroenterology

## 2017-06-08 ENCOUNTER — Ambulatory Visit (INDEPENDENT_AMBULATORY_CARE_PROVIDER_SITE_OTHER): Payer: Medicare Other | Admitting: Orthopedic Surgery

## 2017-06-08 ENCOUNTER — Encounter: Payer: Self-pay | Admitting: Orthopedic Surgery

## 2017-06-08 DIAGNOSIS — M545 Low back pain: Secondary | ICD-10-CM

## 2017-06-08 DIAGNOSIS — S72141D Displaced intertrochanteric fracture of right femur, subsequent encounter for closed fracture with routine healing: Secondary | ICD-10-CM

## 2017-06-08 DIAGNOSIS — G8929 Other chronic pain: Secondary | ICD-10-CM

## 2017-06-08 DIAGNOSIS — R110 Nausea: Secondary | ICD-10-CM

## 2017-06-08 MED ORDER — MEPERIDINE HCL 50 MG PO TABS: 50.0000 mg | ORAL_TABLET | ORAL | 0 refills | Status: DC | PRN

## 2017-06-08 MED ORDER — PROMETHAZINE HCL 12.5 MG PO TABS: 12.5000 mg | ORAL_TABLET | Freq: Four times a day (QID) | ORAL | 0 refills | Status: DC | PRN

## 2017-06-08 NOTE — Progress Notes (Signed)
Follow up   Chief Complaint  Patient presents with  . Follow-up    LEFT HIP    71 year old female with chronic pain only relieved with Demerol with Phenergan needed to control nausea. She's taken this medicine for years she has several allergies to other opioids and she is well controlled and compliant with this medication allows her to have normal function and do her activities of daily living  Possibility of getting left hip injection as well.  Procedure note injection for   left hip bursitis  Verbal consent was obtained for injection of the  left hip  Timeout was completed to confirm the injection site  The medications used were 40 mg of Depo-Medrol and 1% lidocaine 3 cc  Anesthesia was provided by ethyl chloride and the skin was prepped with alcohol.  After cleaning the skin with alcohol a 25-gauge needle was used to inject the  left bursa of the hip

## 2017-06-08 NOTE — Patient Instructions (Signed)
Please be careful with valium and demerol The combination can cause death

## 2017-07-22 ENCOUNTER — Ambulatory Visit: Payer: Medicare Other | Admitting: Gastroenterology

## 2017-08-14 ENCOUNTER — Other Ambulatory Visit: Payer: Self-pay | Admitting: Nurse Practitioner

## 2017-08-17 ENCOUNTER — Other Ambulatory Visit: Payer: Self-pay

## 2017-08-17 MED ORDER — LUBIPROSTONE 8 MCG PO CAPS: ORAL_CAPSULE | ORAL | 2 refills | Status: DC

## 2017-09-13 ENCOUNTER — Encounter: Payer: Self-pay | Admitting: Orthopedic Surgery

## 2017-09-13 ENCOUNTER — Ambulatory Visit (INDEPENDENT_AMBULATORY_CARE_PROVIDER_SITE_OTHER): Payer: Medicare Other | Admitting: Orthopedic Surgery

## 2017-09-13 VITALS — BP 115/78 | HR 70 | Ht 63.0 in | Wt 158.0 lb

## 2017-09-13 DIAGNOSIS — M545 Low back pain: Secondary | ICD-10-CM

## 2017-09-13 DIAGNOSIS — R110 Nausea: Secondary | ICD-10-CM | POA: Diagnosis not present

## 2017-09-13 DIAGNOSIS — S72141D Displaced intertrochanteric fracture of right femur, subsequent encounter for closed fracture with routine healing: Secondary | ICD-10-CM

## 2017-09-13 DIAGNOSIS — G8929 Other chronic pain: Secondary | ICD-10-CM

## 2017-09-13 MED ORDER — PROMETHAZINE HCL 25 MG PO TABS: 25.0000 mg | ORAL_TABLET | Freq: Four times a day (QID) | ORAL | 1 refills | Status: DC | PRN

## 2017-09-13 MED ORDER — MEPERIDINE HCL 50 MG PO TABS: 50.0000 mg | ORAL_TABLET | ORAL | 0 refills | Status: DC | PRN

## 2017-09-13 MED ORDER — PROMETHAZINE HCL 12.5 MG PO TABS: 12.5000 mg | ORAL_TABLET | Freq: Four times a day (QID) | ORAL | 0 refills | Status: DC | PRN

## 2017-09-13 NOTE — Addendum Note (Signed)
Addended by: Arther Abbott E on: 09/13/2017 11:30 AM   Modules accepted: Orders

## 2017-09-13 NOTE — Addendum Note (Signed)
Addended by: Arther Abbott E on: 09/13/2017 11:31 AM   Modules accepted: Orders

## 2017-09-13 NOTE — Progress Notes (Signed)
Follow-up visit  Chief Complaint  Patient presents with  . Follow-up    Recheck on left hip.    History she 71 years old she's had 2 hip fractures with 2 gamma nail she's had lumbar disc surgery and she has declined any further surgery despite ongoing back pain. She got a good relief from injection she's here for recheck on the same area of pain near the left greater trochanter.  Review of systems she also has back pain  Exam BP 115/78   Pulse 70   Ht 5\' 3"  (1.6 m)   Wt 158 lb (71.7 kg)   BMI 27.99 kg/m  She is awake alert and oriented 3 Appearance is normal Mood is pleasant and affect normal or she is currently 1  Exam she has poor standing posture poor sagittal balance She is tender area near her surgical site left SI joint left greater trochanter  Her gait is altered with a flexed posture   Encounter Diagnoses  Name Primary?  . Intertrochanteric fracture of right hip, closed, with routine healing, subsequent encounter   . Nausea without vomiting   . Chronic midline low back pain, with sciatica presence unspecified Yes   A steroid injection was performed at Left posterior greater trochanter in the area the sciatic nerve using 1% plain Lidocaine and 40 mg of Depo-Medrol

## 2017-11-08 ENCOUNTER — Telehealth: Payer: Self-pay | Admitting: Orthopedic Surgery

## 2017-11-08 NOTE — Telephone Encounter (Signed)
Patient called to relay that her left hip is hurting and swelling; asking for appointment for possible repeat injection, for which we do not have any immediate openings. Also, her last injection was 09/13/17, less than 2 months.  She is asking if unable to come for visit/injection,may she get a refill of pain medication (states her prescription insurance always needs prior auth).  Her regular appointment is scheduled 12/14/17.  Please advise.

## 2017-11-08 NOTE — Telephone Encounter (Signed)
You gave her Demerol 09/13/17, see her phone message, and advise.

## 2017-11-08 NOTE — Telephone Encounter (Signed)
Dec 11

## 2017-11-09 NOTE — Telephone Encounter (Signed)
Patient called in; relayed and scheduled per Dr Ruthe Mannan response.

## 2017-11-16 ENCOUNTER — Ambulatory Visit: Payer: Medicare Other | Admitting: Orthopedic Surgery

## 2017-11-17 ENCOUNTER — Encounter: Payer: Self-pay | Admitting: Orthopedic Surgery

## 2017-11-17 ENCOUNTER — Ambulatory Visit (INDEPENDENT_AMBULATORY_CARE_PROVIDER_SITE_OTHER): Payer: Medicare Other | Admitting: Orthopedic Surgery

## 2017-11-17 VITALS — BP 98/63 | HR 97 | Ht 63.0 in | Wt 160.0 lb

## 2017-11-17 DIAGNOSIS — M25552 Pain in left hip: Secondary | ICD-10-CM

## 2017-11-17 DIAGNOSIS — G8929 Other chronic pain: Secondary | ICD-10-CM

## 2017-11-17 DIAGNOSIS — M545 Low back pain: Secondary | ICD-10-CM

## 2017-11-17 MED ORDER — MEPERIDINE HCL 50 MG PO TABS: 50.0000 mg | ORAL_TABLET | ORAL | 0 refills | Status: DC | PRN

## 2017-11-17 MED ORDER — PROMETHAZINE HCL 25 MG PO TABS: 25.0000 mg | ORAL_TABLET | Freq: Four times a day (QID) | ORAL | 1 refills | Status: DC | PRN

## 2017-11-17 NOTE — Progress Notes (Signed)
Progress Note   Patient ID: Ellen Anderson, female   DOB: 03-30-1946, 71 y.o.   MRN: [id number]  Chief Complaint  Patient presents with  . Hip Pain    left hip / wants injection     71 year old female requests injection hip  History of chronic pain chronic back pain nonoperable history of chronic bursitis       ROS No outpatient medications have been marked as taking for the 11/17/17 encounter (Office Visit) with Carole Civil, MD.    Allergies  Allergen Reactions  . Amoxicillin-Pot Clavulanate Swelling    Of throat. "States she can take if given with prednisone and Benadryl"  . Ceclor [Cefaclor] Swelling    Of throat. States "I can take with prednisone and Benadryl"  . Morphine And Related Other (See Comments)    Causes heart to stop. Incidence occurred at [facility] about 6 years ago.  . Sulfur Shortness Of Breath    Tongue swelling  . Azithromycin Other (See Comments)    States "All '-mycins' break my mouth out in blisters". Can take penicillin  . Flexeril [Cyclobenzaprine] Hives and Swelling    Breaks out in hives and throat swells up.  Clementeen Hoof [Iodinated Diagnostic Agents] Other (See Comments)    convulsions  . Methadone Other (See Comments)    hallucinations  . Percocet [Oxycodone-Acetaminophen] Itching    Scratches til she bleeds  . Betadine [Povidone Iodine] Rash  . Codeine Hives and Rash  . Escitalopram Oxalate Other (See Comments)    hallucinations  . Ibuprofen Hives and Rash    Severe rash per patient  . Tylenol [Acetaminophen] Hives and Rash    Severe itching     There were no vitals taken for this visit.  Physical Exam  Tenderness left greater trochanter and left SI joint and left sciatic nerve Medical decision-making Encounter Diagnosis  Name Primary?  . Intertrochanteric fracture of right hip, closed, with routine healing, subsequent encounter       Meds ordered this encounter  Medications  . promethazine (PHENERGAN) 25 MG tablet   Sig: Take 1 tablet (25 mg total) by mouth every 6 (six) hours as needed for nausea or vomiting.    Dispense:  90 tablet    Refill:  1  . meperidine (DEMEROL) 50 MG tablet    Sig: Take 1 tablet (50 mg total) by mouth every 4 (four) hours as needed for moderate pain.    Dispense:  90 tablet    Refill:  0   Procedure note injection for   left hip bursitis  Verbal consent was obtained for injection of the  left hip  Timeout was completed to confirm the injection site  The medications used were 40 mg of Depo-Medrol and 1% lidocaine 3 cc  Anesthesia was provided by ethyl chloride and the skin was prepped with alcohol.  After cleaning the skin with alcohol a 25-gauge needle was used to inject the  left bursa of the hip  Arther Abbott, MD 11/17/2017 10:36 AM

## 2017-12-14 ENCOUNTER — Ambulatory Visit: Payer: Medicare Other | Admitting: Orthopedic Surgery

## 2018-02-04 ENCOUNTER — Other Ambulatory Visit: Payer: Self-pay | Admitting: Nurse Practitioner

## 2018-03-10 ENCOUNTER — Other Ambulatory Visit: Payer: Self-pay | Admitting: Radiology

## 2018-03-10 DIAGNOSIS — G8929 Other chronic pain: Secondary | ICD-10-CM

## 2018-03-10 DIAGNOSIS — M25552 Pain in left hip: Principal | ICD-10-CM

## 2018-03-10 MED ORDER — MEPERIDINE HCL 50 MG PO TABS: 50.0000 mg | ORAL_TABLET | ORAL | 0 refills | Status: DC | PRN

## 2018-03-10 MED ORDER — PROMETHAZINE HCL 25 MG PO TABS: 25.0000 mg | ORAL_TABLET | Freq: Four times a day (QID) | ORAL | 1 refills | Status: DC | PRN

## 2018-03-10 NOTE — Telephone Encounter (Signed)
Patient asking for refills on demerol and phenergan

## 2018-03-11 ENCOUNTER — Telehealth: Payer: Self-pay | Admitting: Orthopedic Surgery

## 2018-03-11 ENCOUNTER — Other Ambulatory Visit: Payer: Self-pay | Admitting: Orthopedic Surgery

## 2018-03-11 DIAGNOSIS — M25552 Pain in left hip: Principal | ICD-10-CM

## 2018-03-11 DIAGNOSIS — G8929 Other chronic pain: Secondary | ICD-10-CM

## 2018-03-11 MED ORDER — MEPERIDINE HCL 50 MG PO TABS
50.0000 mg | ORAL_TABLET | ORAL | 0 refills | Status: DC | PRN
Start: 2018-03-11 — End: 2018-06-28

## 2018-03-11 MED ORDER — MEPERIDINE HCL 50 MG PO TABS: 50.0000 mg | ORAL_TABLET | ORAL | 0 refills | Status: DC | PRN

## 2018-03-11 MED ORDER — PROMETHAZINE HCL 25 MG PO TABS: 25.0000 mg | ORAL_TABLET | Freq: Four times a day (QID) | ORAL | 1 refills | Status: DC | PRN

## 2018-03-11 NOTE — Telephone Encounter (Signed)
Patient states the 2 medications went to her "old" pharmacy [facility]. States needs them sent to [facility] [place].  Also brought copy of letter from [employer] (from 2018) which states prior approval is to be obtained once per year. Their contact ph# is [phone].

## 2018-03-11 NOTE — Telephone Encounter (Signed)
Patient states the 2 medications went to her "old" pharmacy [facility]. States needs them sent to [facility] [place].  Also brought copy of letter from [employer] (from 2018) which states prior approval is to be obtained once per year. Their contact ph# is [phone].  She did not tell us this yesterday, I called Psychiatrist to cancel   Changed pharmacy will you resend?

## 2018-03-21 DIAGNOSIS — Z9889 Other specified postprocedural states: Secondary | ICD-10-CM | POA: Insufficient documentation

## 2018-03-21 DIAGNOSIS — Z8781 Personal history of (healed) traumatic fracture: Secondary | ICD-10-CM | POA: Insufficient documentation

## 2018-03-22 ENCOUNTER — Encounter: Payer: Self-pay | Admitting: Orthopedic Surgery

## 2018-03-22 ENCOUNTER — Ambulatory Visit (INDEPENDENT_AMBULATORY_CARE_PROVIDER_SITE_OTHER): Payer: Medicare Other | Admitting: Orthopedic Surgery

## 2018-03-22 ENCOUNTER — Telehealth: Payer: Self-pay | Admitting: Radiology

## 2018-03-22 VITALS — BP 211/155 | HR 50 | Ht 63.0 in | Wt 161.0 lb

## 2018-03-22 DIAGNOSIS — G8929 Other chronic pain: Secondary | ICD-10-CM

## 2018-03-22 DIAGNOSIS — G894 Chronic pain syndrome: Secondary | ICD-10-CM | POA: Diagnosis not present

## 2018-03-22 DIAGNOSIS — M545 Low back pain: Secondary | ICD-10-CM | POA: Diagnosis not present

## 2018-03-22 DIAGNOSIS — M25552 Pain in left hip: Secondary | ICD-10-CM

## 2018-03-22 NOTE — Telephone Encounter (Signed)
Patient was in the office today angry wanting to shoot someone because she could not get her valium and her meperidine filled at [facility]. I called the pharmacy and they are ready for her to pick up.   I have advised her what pharmacy has told me. She continued to be angry states she has to have these medications  I have advised Dr Aline Brochure, he is aware.

## 2018-03-22 NOTE — Progress Notes (Signed)
Progress Note   Patient ID: Ellen Anderson, female   DOB: [birth year], 72 y.o.   MRN: [id number]  Chief Complaint  Patient presents with  . Medication Management    patient states she wants to "shoot someone" she can not get valium any more due to meperidine, and she states meperidine rx not at the pharmacy angry today     72 year old female with chronic pain presents for injection left hip previous x-ray showed no hip arthritis she has an implanted stable her hip pain is coming from her chronic nonoperative back pain      ROS Current Meds  Medication Sig  . AMITIZA 8 MCG capsule TAKE 1 CAPSULE BY MOUTH TWICE DAILY WITH FOOD  . aspirin 325 MG tablet Take 325 mg by mouth daily as needed for moderate pain or headache.  . Calcium Carb-Cholecalciferol 600-500 MG-UNIT CAPS Take 1 capsule by mouth 2 (two) times daily.  Marland Kitchen denosumab (PROLIA) 60 MG/ML SOLN injection Inject 60 mg into the skin every 6 (six) months. Administer in upper arm, thigh, or abdomen  . diazepam (VALIUM) 10 MG tablet Take 1 tablet (10 mg total) by mouth every 8 (eight) hours as needed. For muscle spasms  . docusate sodium (COLACE) 100 MG capsule Take 100 mg by mouth 2 (two) times daily.  . ergocalciferol (VITAMIN D2) 50000 units capsule Take 50,000 Units by mouth once a week. Every Tuesday.  . lubiprostone (AMITIZA) 8 MCG capsule TAKE 1 CAPSULE BY MOUTH TWICE DAILY WITH FOOD  . meperidine (DEMEROL) 50 MG tablet Take 1 tablet (50 mg total) by mouth every 4 (four) hours as needed for moderate pain.  Marland Kitchen omeprazole (PRILOSEC) 20 MG capsule TAKE 1 CAPSULE(20 MG) BY MOUTH DAILY BEFORE BREAKFAST  . omeprazole (PRILOSEC) 20 MG capsule TAKE 1 CAPSULE BY MOUTH DAILY BEFORE BREAKFAST  . promethazine (PHENERGAN) 25 MG tablet Take 1 tablet (25 mg total) by mouth every 6 (six) hours as needed for nausea or vomiting.  Marland Kitchen Propylene Glycol (SYSTANE BALANCE) 0.6 % SOLN Place 1 drop into both eyes daily as needed (dry eyes).    Allergies   Allergen Reactions  . Amoxicillin-Pot Clavulanate Swelling    Of throat. "States she can take if given with prednisone and Benadryl"  . Ceclor [Cefaclor] Swelling    Of throat. States "I can take with prednisone and Benadryl"  . Morphine And Related Other (See Comments)    Causes heart to stop. Incidence occurred at [facility] about 6 years ago.  . Sulfur Shortness Of Breath    Tongue swelling  . Azithromycin Other (See Comments)    States "All '-mycins' break my mouth out in blisters". Can take penicillin  . Flexeril [Cyclobenzaprine] Hives and Swelling    Breaks out in hives and throat swells up.  Clementeen Hoof [Iodinated Diagnostic Agents] Other (See Comments)    convulsions  . Methadone Other (See Comments)    hallucinations  . Percocet [Oxycodone-Acetaminophen] Itching    Scratches til she bleeds  . Betadine [Povidone Iodine] Rash  . Codeine Hives and Rash  . Escitalopram Oxalate Other (See Comments)    hallucinations  . Ibuprofen Hives and Rash    Severe rash per patient  . Tylenol [Acetaminophen] Hives and Rash    Severe itching     BP (!) 211/155   Pulse (!) 50   Ht 5\' 3"  (1.6 m)   Wt 161 lb (73 kg)   BMI 28.52 kg/m   Physical Exam Left hip is  tender over the greater trochanter and buttock area no erythema incision healed well hip flexion is normal  Medical decision-making Encounter Diagnoses  Name Primary?  . Chronic left hip pain, orif gamma nail    . Chronic midline low back pain, with sciatica presence unspecified Yes  . Chronic pain syndrome    Left hip injection Procedure note injection for   left hip bursitis  Verbal consent was obtained for injection of the  left hip  Timeout was completed to confirm the injection site  The medications used were 40 mg of Depo-Medrol and 1% lidocaine 3 cc  Anesthesia was provided by ethyl chloride and the skin was prepped with alcohol.  After cleaning the skin with alcohol a 25-gauge needle was used to inject the  left  bursa of the hip  Return as needed  Amy Littrell, RT 03/22/2018 1:52 PM

## 2018-05-29 ENCOUNTER — Other Ambulatory Visit: Payer: Self-pay | Admitting: Gastroenterology

## 2018-06-28 ENCOUNTER — Encounter: Payer: Self-pay | Admitting: Orthopedic Surgery

## 2018-06-28 ENCOUNTER — Ambulatory Visit (INDEPENDENT_AMBULATORY_CARE_PROVIDER_SITE_OTHER): Payer: Medicare Other | Admitting: Orthopedic Surgery

## 2018-06-28 ENCOUNTER — Telehealth: Payer: Self-pay | Admitting: Radiology

## 2018-06-28 VITALS — BP 107/54 | HR 83 | Ht 63.0 in | Wt 159.0 lb

## 2018-06-28 DIAGNOSIS — M25552 Pain in left hip: Secondary | ICD-10-CM

## 2018-06-28 DIAGNOSIS — Z8781 Personal history of (healed) traumatic fracture: Secondary | ICD-10-CM | POA: Diagnosis not present

## 2018-06-28 DIAGNOSIS — Z9889 Other specified postprocedural states: Secondary | ICD-10-CM

## 2018-06-28 DIAGNOSIS — Z967 Presence of other bone and tendon implants: Secondary | ICD-10-CM | POA: Diagnosis not present

## 2018-06-28 DIAGNOSIS — G8929 Other chronic pain: Secondary | ICD-10-CM

## 2018-06-28 MED ORDER — MEPERIDINE HCL 50 MG PO TABS: 50.0000 mg | ORAL_TABLET | ORAL | 0 refills | Status: DC | PRN

## 2018-06-28 MED ORDER — PROMETHAZINE HCL 25 MG PO TABS: 25.0000 mg | ORAL_TABLET | Freq: Four times a day (QID) | ORAL | 1 refills | Status: DC | PRN

## 2018-06-28 NOTE — Addendum Note (Signed)
Addended by: Arther Abbott E on: 06/28/2018 12:25 PM   Modules accepted: Orders

## 2018-06-28 NOTE — Progress Notes (Signed)
Chief Complaint  Patient presents with  . Follow-up    Recheck on left hip, requesting injection.    BP (!) 107/54   Pulse 83   Ht 5\' 3"  (1.6 m)   Wt 159 lb (72.1 kg)   BMI 28.17 kg/m   Meds ordered this encounter  Medications  . meperidine (DEMEROL) 50 MG tablet    Sig: Take 1 tablet (50 mg total) by mouth every 4 (four) hours as needed for moderate pain.    Dispense:  90 tablet    Refill:  0  . promethazine (PHENERGAN) 25 MG tablet    Sig: Take 1 tablet (25 mg total) by mouth every 6 (six) hours as needed for nausea or vomiting.    Dispense:  90 tablet    Refill:  1   Encounter Diagnoses  Name Primary?  . S/P ORIF right IM nail hip 03/08/13   . Chronic left hip pain Yes     Procedure note injection for   LEFT   hip bursitis  Verbal consent was obtained for injection of the  LEFT   hip  Timeout was completed to confirm the injection site  The medications used were 40 mg of Depo-Medrol and 1% lidocaine 3 cc  Anesthesia was provided by ethyl chloride and the skin was prepped with alcohol.  After cleaning the skin with alcohol a 25-gauge needle was used to inject the  LEFT  bursa of the hip

## 2018-06-28 NOTE — Telephone Encounter (Signed)
I have advised patient of backorder, she states she will just become an alcoholic I have advised Dr Aline Brochure

## 2018-06-28 NOTE — Telephone Encounter (Signed)
Ellen Anderson has stated patient came in asked to speak to Dr Aline Brochure and sat in the lobby. Patient is in lobby now.

## 2018-06-28 NOTE — Telephone Encounter (Signed)
She wants you to call it in to [place], I called there and it is on national back order.

## 2018-09-28 ENCOUNTER — Telehealth: Payer: Self-pay | Admitting: Radiology

## 2018-09-28 ENCOUNTER — Ambulatory Visit (INDEPENDENT_AMBULATORY_CARE_PROVIDER_SITE_OTHER): Payer: Medicare Other | Admitting: Orthopedic Surgery

## 2018-09-28 VITALS — BP 92/61 | HR 51 | Ht 63.0 in | Wt 160.0 lb

## 2018-09-28 DIAGNOSIS — M25552 Pain in left hip: Secondary | ICD-10-CM

## 2018-09-28 DIAGNOSIS — G894 Chronic pain syndrome: Secondary | ICD-10-CM | POA: Diagnosis not present

## 2018-09-28 DIAGNOSIS — Z9889 Other specified postprocedural states: Secondary | ICD-10-CM | POA: Diagnosis not present

## 2018-09-28 DIAGNOSIS — Z8781 Personal history of (healed) traumatic fracture: Secondary | ICD-10-CM

## 2018-09-28 DIAGNOSIS — G8929 Other chronic pain: Secondary | ICD-10-CM

## 2018-09-28 DIAGNOSIS — M961 Postlaminectomy syndrome, not elsewhere classified: Secondary | ICD-10-CM

## 2018-09-28 MED ORDER — ETHYL CHLORIDE EX AERO: INHALATION_SPRAY | CUTANEOUS | 0 refills | Status: DC | PRN

## 2018-09-28 MED ORDER — OXYCODONE-ACETAMINOPHEN 10-325 MG PO TABS: 1.0000 | ORAL_TABLET | ORAL | 0 refills | Status: AC | PRN

## 2018-09-28 NOTE — Patient Instructions (Signed)
What You Need to Know About Prescription Opioid Pain Medicine        Please be advised. You are on a medication which is classified as an "opiod". The CDC the [facility]  has recently advised all providers to advise patient's that these medications have certain risks which include but are not limited to:    drug intolerance  drug addiction  respiratory depression   respiratory failure  Death  Please keep these medications locked away. If you feel that you are becoming addicted to these medicines or you are having difficulties with these medications please alert your provider.   As your provider I will attempt to wean you off of these medications when you're severe acute pain has been taking care of. However, if we cannot wean you off of this medication you will be sent to a pain management center where they can better manage chronic pain   Opioids are powerful medicines that are used to treat moderate to severe pain. Opioids should be taken with the supervision of a trained health care provider. They should be taken for the shortest period of time as possible. This is because opioids can be addictive and the longer you take opioids, the greater your risk of addiction (opioid use disorder). What do opioids do? Opioids help reduce or eliminate pain. When used for short periods of time, they can help you:  Sleep better.  Do better in physical or occupational therapy.  Feel better in the first few days after an injury.  Recover from surgery. What kind of problems can opioids cause? Opioids can cause side effects, such as:  Constipation.  Nausea.  Vomiting.  Drowsiness.  Confusion.  Opioid use disorder.  Breathing difficulties (respiratory depression). Using opioid pain medicines for longer than 3 days increases your risk of these side effects. Taking opioid pain medicine for a long period of time can affect your ability to do daily tasks. It also  puts you at risk for:  Car accidents.  Heart attack.  Overdose, which can sometimes lead to death. What can increase my risk for developing problems while taking opioids? You may be at an especially high risk for problems while taking opioids if you:  Are over the age of 65.  Are pregnant.  Have kidney or liver disease.  Have certain mental health conditions, such as depression or anxiety.  Have a history of substance use disorder.  Have had an opioid overdose in the past. How do I stop taking opioids if I have been taking them for a long time? If you have been taking opioid medicine for more than a few weeks, you may need to slowly stop taking them (taper). Tapering your use of opioids can decrease your chances of experiencing withdrawal symptoms, such as:  Abdominal pain and cramping.  Nausea.  Sweating.  Sleepiness.  Restlessness.  Uncontrollable shaking (tremors).  Cravings for the medicine. Do not attempt to taper your use of opioids on your own. Talk with your health care provider about how to do this. Your health care provider may prescribe a step-down schedule based on how much medicine you are taking and how long you have been taking it. What are the benefits of stopping the use of opioids? By switching from opioid pain medicine to non-opioid pain management options, you will decrease your risk of accidents and injuries associated with long-term opioid use. You will also be able to:  Monitor your pain more accurately and know when to   seek medical care if it is not improving.  Decrease risk to others around you. Having opioids in the home increases the risk for accidental or intentional use or overdose by others. How can I treat pain without opioids? Pain can be managed with many types of alternative treatments. Ask your health care provider to refer you to one or more specialists who can help you manage pain through:  Physical or occupational  therapy.  Counseling (cognitive-behavioral therapy).  Good nutrition.  Biofeedback.  Massage.  Meditation.  Non-opioid medicine.  Following a gentle exercise program. Where can I get support? If you have been taking opioids for a long time, you may benefit from receiving support for quitting from a local support group or counselor. Ask your health care provider for a referral to these resources in your area. When should I seek medical care? Seek medical care right away if you are taking opioids and you experience any of the following:  Difficulty breathing.  Breathing that is more shallow or slower than normal.  A very slow heartbeat (pulse).  Severe confusion.  Unconsciousness.  Sleepiness.  Difficulty waking from sleep.  Slurred speech.  Nausea and vomiting.  Cold, clammy skin.  Blue lips or fingernails.  Limpness.  Abnormally small pupils. If you think that you or someone else may have taken too much of an opioid medicine, get medical help right away. Do not wait to see if the symptoms go away on their own. Call your local emergency services (911 in the [place] or call the hotline of the National Poison Control Center (800-222-1222 in the [place]  Where can I get more information? To learn more about opioid medicines, visit the Centers for Disease Control and Prevention web site Opioid Basics at https://www.cdc.gov/drugoverdos/opioids/index.html. Summary  Opioid medicines can help you manage moderate-to-severe pain for a short period of time.  Taking opioid pain medicine for a long period of time puts you at risk for unintentional accidents, injury, and even death.  If you think that you or someone else may have taken too much of an opioid, get medical help right away. This information is not intended to replace advice given to you by your health care provider. Make sure you discuss any questions you have with your health care provider. Document Released:  12/20/2015 Document Revised: 07/17/2016 Document Reviewed: 07/05/2015 Elsevier Interactive Patient Education  2017 Elsevier Inc.   

## 2018-09-28 NOTE — Progress Notes (Signed)
Progress Note   Patient ID: Ellen Anderson, female   DOB: October 29, 1946, 72 y.o.   MRN: [id number]   Chief Complaint  Patient presents with  . Follow-up    Recheck on left hip.    Chronic left hip pain  (primary encounter diagnosis) S/p orif right im nail hip 03/08/13 Encounter for chronic pain management Chronic pain syndrome Failed back syndrome of lumbar spine 72 year old female who has chronic pain syndrome from failed back syndrome number neurosurgeon said they would no longer operate.  So she was coming here to get Demerol and Phenergan but says that the pharmacies say that they will no longer carry the Demerol.  She says she is taking 15 aspirins per day but is concerned about her GI tract.  She has extreme allergies to all of the common pain medications she did not tolerate Dilaudid as well.  She was doing well with the Demerol and Phenergan but again it is no longer available   NARX SCORES Narcotic 340 Sedative 451 Stimulant 000 Explanation and Guidance OVERDOSE RISK SCORE   250  (Range 000-999) Explanation and Guidance ADDITIONAL RISK INDICATORS ( 0 )  ROS   Allergies  Allergen Reactions  . Amoxicillin-Pot Clavulanate Swelling    Of throat. "States she can take if given with prednisone and Benadryl"  . Ceclor [Cefaclor] Swelling    Of throat. States "I can take with prednisone and Benadryl"  . Morphine And Related Other (See Comments)    Causes heart to stop. Incidence occurred at [facility] about 6 years ago.  . Sulfur Shortness Of Breath    Tongue swelling  . Azithromycin Other (See Comments)    States "All '-mycins' break my mouth out in blisters". Can take penicillin  . Flexeril [Cyclobenzaprine] Hives and Swelling    Breaks out in hives and throat swells up.  Clementeen Hoof [Iodinated Diagnostic Agents] Other (See Comments)    convulsions  . Methadone Other (See Comments)    hallucinations  . Percocet [Oxycodone-Acetaminophen] Itching    Scratches til she bleeds   . Betadine [Povidone Iodine] Rash  . Codeine Hives and Rash  . Escitalopram Oxalate Other (See Comments)    hallucinations  . Ibuprofen Hives and Rash    Severe rash per patient  . Tylenol [Acetaminophen] Hives and Rash    Severe itching     BP 92/61   Pulse (!) 51   Ht 5\' 3"  (1.6 m)   Wt 160 lb (72.6 kg)   BMI 28.34 kg/m   Physical Exam  Musculoskeletal:       Left hip: She exhibits tenderness.     Medical decisions:   Data  Imaging:   No   Encounter Diagnoses  Name Primary?  . Chronic left hip pain Yes  . S/P ORIF right IM nail hip 03/08/13   . Encounter for chronic pain management   . Chronic pain syndrome   . Failed back syndrome of lumbar spine    Procedure note injection for   Left  hip bursitis  Verbal consent was obtained for injection of the  Left  hip  Timeout was completed to confirm the injection site  The medications used were 40 mg of Depo-Medrol and 1% lidocaine 3 cc  Anesthesia was provided by ethyl chloride and the skin was prepped with alcohol.  After cleaning the skin with alcohol a 25-gauge needle was used to inject the  Left  bursa of the hip PLAN:   Referral to pain  clinic  Meds ordered this encounter  Medications  . oxyCODONE-acetaminophen (PERCOCET) 10-325 MG tablet    Sig: Take 1 tablet by mouth every 4 (four) hours as needed for up to 7 days for pain.    Dispense:  42 tablet    Refill:  0  . ethyl chloride spray    Sig: Apply topically as needed.    Dispense:  120 mL    Refill:  0      Arther Abbott, MD 09/28/2018 10:32 AM

## 2018-09-28 NOTE — Telephone Encounter (Signed)
Left message for patient, insurance will not cover the ethyl chloride spray for her, she will have to pay for this out of pocket.

## 2018-09-29 ENCOUNTER — Telehealth: Payer: Self-pay | Admitting: Radiology

## 2018-09-29 NOTE — Telephone Encounter (Signed)
Pain management called her, and she declined appointment

## 2018-10-03 ENCOUNTER — Other Ambulatory Visit: Payer: Self-pay | Admitting: Gastroenterology

## 2018-10-11 ENCOUNTER — Other Ambulatory Visit: Payer: Self-pay | Admitting: Orthopedic Surgery

## 2018-10-11 NOTE — Telephone Encounter (Signed)
Patient left message on voicemail stating she has an appointment with the pain clinic later this month and is asking if Dr. Aline Brochure would do a prescription for pain medication.   PATIENT USES [facility]

## 2018-10-11 NOTE — Telephone Encounter (Signed)
Patient now states she has appt for the end of the month

## 2018-10-12 NOTE — Telephone Encounter (Signed)
DENIED  SHE HAS TO GO TO PAIN MANAGEMENT AS STATED WHEN SHE WAS HER E

## 2018-10-12 NOTE — Telephone Encounter (Signed)
I have advised patient and she has voiced understanding

## 2018-11-23 ENCOUNTER — Telehealth: Payer: Self-pay | Admitting: Orthopedic Surgery

## 2018-11-23 NOTE — Telephone Encounter (Signed)
ITHAS TO BE Monday   NO Friday [facility]

## 2018-11-23 NOTE — Telephone Encounter (Signed)
Patient called this afternoon and wants to come in for hip injection.  Is it okay to schedule her to come in for this?  Which day would be better, Friday or Monday?  Please advise  Thanks

## 2018-11-28 ENCOUNTER — Ambulatory Visit (INDEPENDENT_AMBULATORY_CARE_PROVIDER_SITE_OTHER): Payer: Medicare Other | Admitting: Orthopedic Surgery

## 2018-11-28 ENCOUNTER — Encounter: Payer: Self-pay | Admitting: Orthopedic Surgery

## 2018-11-28 ENCOUNTER — Telehealth: Payer: Self-pay | Admitting: Orthopedic Surgery

## 2018-11-28 VITALS — BP 115/81 | HR 90 | Ht 63.0 in | Wt 152.0 lb

## 2018-11-28 DIAGNOSIS — S72002S Fracture of unspecified part of neck of left femur, sequela: Secondary | ICD-10-CM

## 2018-11-28 DIAGNOSIS — S72001S Fracture of unspecified part of neck of right femur, sequela: Secondary | ICD-10-CM

## 2018-11-28 MED ORDER — MEPERIDINE HCL 50 MG PO TABS: 50.0000 mg | ORAL_TABLET | Freq: Four times a day (QID) | ORAL | 0 refills | Status: DC | PRN

## 2018-11-28 NOTE — Telephone Encounter (Signed)
I called to advise Dr Aline Brochure has declined.

## 2018-11-28 NOTE — Telephone Encounter (Signed)
Patient states she cannot get Demerol filled.  No pharmacy has it.  She wants Oxycodone and she will take Benadryl with this.  Please send to [facility] on [address]

## 2018-11-28 NOTE — Progress Notes (Addendum)
Chief Complaint  Patient presents with  . Hip Pain    Left hip    Returns for injection left hip  Left hip prepped with ethyl chloride and alcohol patient confirmed site is site of injection patient gave verbal consent to get injection  40 mg Depo-Medrol 3 cc 1% lidocaine injected into the left greater choke region. Tolerated well with out   Encounter Diagnosis  Name Primary?  . Closed left hip fracture, sequela Yes   At [facility] need xrays

## 2018-11-28 NOTE — Telephone Encounter (Signed)
No way,  no

## 2018-12-13 ENCOUNTER — Encounter: Payer: Self-pay | Admitting: Orthopedic Surgery

## 2019-02-14 ENCOUNTER — Encounter: Payer: Self-pay | Admitting: *Deleted

## 2019-02-14 ENCOUNTER — Ambulatory Visit (INDEPENDENT_AMBULATORY_CARE_PROVIDER_SITE_OTHER): Payer: Medicare Other | Admitting: Nurse Practitioner

## 2019-02-14 ENCOUNTER — Other Ambulatory Visit: Payer: Self-pay | Admitting: *Deleted

## 2019-02-14 ENCOUNTER — Encounter: Payer: Self-pay | Admitting: Nurse Practitioner

## 2019-02-14 ENCOUNTER — Telehealth: Payer: Self-pay | Admitting: *Deleted

## 2019-02-14 VITALS — BP 104/72 | HR 88 | Temp 97.2°F | Ht 62.5 in | Wt 157.4 lb

## 2019-02-14 DIAGNOSIS — R1310 Dysphagia, unspecified: Secondary | ICD-10-CM

## 2019-02-14 DIAGNOSIS — R1319 Other dysphagia: Secondary | ICD-10-CM

## 2019-02-14 NOTE — Telephone Encounter (Signed)
Pre-op scheduled for 6/10 at 10:00am. Called pt, no answer and no VM. Letter mailed

## 2019-02-14 NOTE — Progress Notes (Signed)
Referring Provider: Jani Gravel, MD Primary Care Physician:  Jani Gravel, MD Primary GI:  Dr. Oneida Alar  Chief Complaint  Patient presents with  . Dysphagia    HPI:   Ellen Anderson is a 73 y.o. female who presents for dysphasia.  The patient was last seen in our office 07/06/2016 for the same.  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  Colonoscopy up-to-date next due in 5 to 10 years.  She did have an EGD prior to her last visit for dysphasia and food impaction.  Recommended Prilosec daily, follow-up in 1 year.  Previous EGD completed 03/04/2016 for dysphasia.  Noted mild Schatzki's ring status post dilation, normal stomach with a 5 cm gastric pouch, normal jejunum.  Recommended follow-up in 4 months.  Today she states she's doing ok overall. She has had dysphagia symptoms her whole life with multiple dilations. Last dilation worked well and resolved her symptoms for a time. Still on omeprazole once a day. Denies any GERD symptoms. Typically has solid food dysphagia intermittently. Yesterday she ate a marshmallow bunny that became lodged for 15 minutes; tried water but it just came up and out her nose. Has had occasional pill dysphagia with aspirin. No regurgitation. Denies N/V, abdominal pain, fever, chills, unintentional weight loss. Denies chest pain, dyspnea, dizziness, lightheadedness, syncope, near syncope. Denies any other upper or lower GI symptoms.  Past Medical History:  Diagnosis Date  . Anemia   . Chronic low back pain   . Degenerative joint disease   . Hip fracture, left ([place]) 03/12/2012  . Hypertension   . PONV (postoperative nausea and vomiting)     Past Surgical History:  Procedure Laterality Date  . ABDOMINAL HYSTERECTOMY    . ANKLE SURGERY Left    pins and screws  . APPENDECTOMY    . BACK SURGERY    . CATARACT EXTRACTION W/PHACO Right 02/18/2015   Procedure: CATARACT EXTRACTION PHACO AND INTRAOCULAR LENS PLACEMENT RIGHT EYE; CDE:  10.48;  Surgeon: Tonny Branch, MD;   Location: [facility];  Service: Ophthalmology;  Laterality: Right;  . CATARACT EXTRACTION W/PHACO Left 03/07/2015   Procedure: CATARACT EXTRACTION PHACO AND INTRAOCULAR LENS PLACEMENT (IOC);  Surgeon: Tonny Branch, MD;  Location: [facility];  Service: Ophthalmology;  Laterality: Left;  CDE:7.14  . CHOLECYSTECTOMY    . COLONOSCOPY N/A 03/04/2016   Procedure: COLONOSCOPY;  Surgeon: Danie Binder, MD;  Location: [facility];  Service: Endoscopy;  Laterality: N/A;  930   . ESOPHAGOGASTRODUODENOSCOPY  07/23/2004   Dr. Rehman:foreign body impactedat cervical esophagus PORK CHOP, dilation of esophageal web  . ESOPHAGOGASTRODUODENOSCOPY  2011   Dr. Oneida Alar: food bolus which would not pass through gastric remnant into jejunostomy, PORK CHOP removed. AVOID PORK CHOPS  . ESOPHAGOGASTRODUODENOSCOPY N/A 03/04/2016   Procedure: ESOPHAGOGASTRODUODENOSCOPY (EGD);  Surgeon: Danie Binder, MD;  Location: [facility];  Service: Endoscopy;  Laterality: N/A;  . GASTRIC BYPASS  1981  . HIP SURGERY Bilateral    fractured bilateral hips  . INTRAMEDULLARY (IM) NAIL INTERTROCHANTERIC Right 03/08/2013   Procedure: INTRAMEDULLARY (IM) NAIL INTERTROCHANTRIC;  Surgeon: Carole Civil, MD;  Location: [facility];  Service: Orthopedics;  Laterality: Right;  . SAVORY DILATION N/A 03/04/2016   Procedure: SAVORY DILATION;  Surgeon: Danie Binder, MD;  Location: [facility];  Service: Endoscopy;  Laterality: N/A;    Current Outpatient Medications  Medication Sig Dispense Refill  . AMITIZA 8 MCG capsule TAKE 1 CAPSULE BY MOUTH TWICE DAILY WITH FOOD  60 capsule 5  . aspirin 325 MG tablet Take 325 mg by mouth daily as needed for moderate pain or headache.    . Calcium Carb-Cholecalciferol 600-500 MG-UNIT CAPS Take 1 capsule by mouth 2 (two) times daily.    Marland Kitchen denosumab (PROLIA) 60 MG/ML SOLN injection Inject 60 mg into the skin every 6 (six) months. Administer in upper arm, thigh, or abdomen    . diazepam (VALIUM) 10 MG tablet Take 1  tablet (10 mg total) by mouth every 8 (eight) hours as needed. For muscle spasms 30 tablet 0  . diphenhydrAMINE (BENADRYL) 25 mg capsule Take 75 mg by mouth as needed. Takes 3 before and 3 after taking taking Oxycodone    . lubiprostone (AMITIZA) 8 MCG capsule TAKE 1 CAPSULE BY MOUTH TWICE DAILY WITH FOOD 60 capsule 2  . omeprazole (PRILOSEC) 20 MG capsule TAKE 1 CAPSULE BY MOUTH DAILY BEFORE BREAKFAST 90 capsule 3  . Oxycodone HCl 10 MG TABS Take 1 tablet by mouth as needed.    Marland Kitchen Propylene Glycol (SYSTANE BALANCE) 0.6 % SOLN Place 1 drop into both eyes daily as needed (dry eyes).     No current facility-administered medications for this visit.     Allergies as of 02/14/2019 - Review Complete 02/14/2019  Allergen Reaction Noted  . Amoxicillin-pot clavulanate Swelling 12/25/2011  . Ceclor [cefaclor] Swelling 12/25/2011  . Morphine and related Other (See Comments) 12/25/2011  . Sulfur Shortness Of Breath 12/25/2011  . Azithromycin Other (See Comments) 12/26/2011  . Flexeril [cyclobenzaprine] Hives and Swelling 02/20/2016  . Ivp dye [iodinated diagnostic agents] Other (See Comments) 12/25/2011  . Methadone Other (See Comments) 12/25/2011  . Percocet [oxycodone-acetaminophen] Itching 12/25/2011  . Betadine [povidone iodine] Rash 02/18/2015  . Codeine Hives and Rash 12/25/2011  . Escitalopram oxalate Other (See Comments) 12/25/2011  . Ibuprofen Hives and Rash 12/26/2011  . Tylenol [acetaminophen] Hives and Rash 12/26/2011    Family History  Problem Relation Age of Onset  . Throat cancer Mother   . COPD Father   . Stomach cancer Sister   . COPD Sister   . Colon polyps Sister   . COPD Sister   . Colon cancer Neg Hx   . Esophageal cancer Neg Hx     Social History   Socioeconomic History  . Marital status: Widowed    Spouse name: Not on file  . Number of children: Not on file  . Years of education: Not on file  . Highest education level: Not on file  Occupational History  .  Not on file  Social Needs  . Financial resource strain: Not on file  . Food insecurity:    Worry: Not on file    Inability: Not on file  . Transportation needs:    Medical: Not on file    Non-medical: Not on file  Tobacco Use  . Smoking status: Never Smoker  . Smokeless tobacco: Never Used  Substance and Sexual Activity  . Alcohol use: No    Alcohol/week: 0.0 standard drinks  . Drug use: No  . Sexual activity: Never    Birth control/protection: Surgical  Lifestyle  . Physical activity:    Days per week: Not on file    Minutes per session: Not on file  . Stress: Not on file  Relationships  . Social connections:    Talks on phone: Not on file    Gets together: Not on file    Attends religious service: Not on file    Active  member of club or organization: Not on file    Attends meetings of clubs or organizations: Not on file    Relationship status: Not on file  Other Topics Concern  . Not on file  Social History Narrative  . Not on file    Review of Systems: Complete ROS negative except as per HPI.   Physical Exam: BP 104/72   Pulse 88   Temp (!) 97.2 F (36.2 C) (Oral)   Ht 5' 2.5" (1.588 m)   Wt 157 lb 6.4 oz (71.4 kg)   BMI 28.33 kg/m  General:   Alert and oriented. Pleasant and cooperative. Well-nourished and well-developed.  Eyes:  Without icterus, sclera clear and conjunctiva pink.  Ears:  Normal auditory acuity. Cardiovascular:  S1, S2 present without murmurs appreciated. Extremities without clubbing or edema. Respiratory:  Clear to auscultation bilaterally. No wheezes, rales, or rhonchi. No distress.  Gastrointestinal:  +BS, soft, non-tender and non-distended. No HSM noted. No guarding or rebound. No masses appreciated.  Rectal:  Deferred  Musculoskalatal:  Symmetrical without gross deformities. Neurologic:  Alert and oriented x4;  grossly normal neurologically. Psych:  Alert and cooperative. Normal mood and affect. Heme/Lymph/Immune: No excessive  bruising noted.    02/14/2019 2:38 PM   Disclaimer: This note was dictated with voice recognition software. Similar sounding words can inadvertently be transcribed and may not be corrected upon review.

## 2019-02-14 NOTE — Patient Instructions (Signed)
Your health issues we discussed today were:   Dysphagia (swallowing problems): 1. We will schedule your upper endoscopy with possible dilation for you 2. Further recommendations to follow your procedure 3. Call us if you have any severe or worsening symptoms.   4. If something becomes lodged and is not able to pass you can proceed to the emergency room.  Overall I recommend:  1. Return to our office in 4 months for follow-up 2. Call us if you have any questions or concerns.  At [facility] we value your feedback. You may receive a survey about your visit today. Please share your experience as we strive to create trusting relationships with our patients to provide genuine, compassionate, quality care.  We appreciate your understanding and patience as we review any laboratory studies, imaging, and other diagnostic tests that are ordered as we care for you. Our office policy is 5 business days for review of these results, and any emergent or urgent results are addressed in a timely manner for your best interest. If you do not hear from our office in 1 week, please contact us.   We also encourage the use of MyChart, which contains your medical information for your review as well. If you are not enrolled in this feature, an access code is on this after visit summary for your convenience. Thank you for allowing [place] to be involved in your care.  It was great to see you today!  I hope you have a great day!!

## 2019-02-14 NOTE — Assessment & Plan Note (Signed)
Previously resolved, now recurrent solid food and pill dysphagia.  Multiple dilations in the past.  Chronic history of dysphagia.  No GERD symptoms.  No regurgitation.  Symptoms are not frequent however she has had stuff become lodged for as long as 15 minutes before they would pass.  Attempts to drink fluids to help things pass have just resulted in backflow/regurgitation of the water.  At this point we will proceed with another EGD with possible dilation to further evaluate.  Follow-up in 4 months.  Proceed with EGD on propofol/MAC with Dr. Oneida Alar in near future: the risks, benefits, and alternatives have been discussed with the patient in detail. The patient states understanding and desires to proceed.  The patient is currently on Valium, Benadryl, oxycodone.  No other anticoagulants, anxiolytics, chronic pain medications, or antidepressants.  We will plan for the procedure on propofol/MAC to promote adequate sedation.

## 2019-02-15 NOTE — Progress Notes (Signed)
CC'D TO PCP °

## 2019-02-27 ENCOUNTER — Ambulatory Visit: Payer: Medicare Other | Admitting: Orthopedic Surgery

## 2019-04-25 ENCOUNTER — Other Ambulatory Visit: Payer: Self-pay | Admitting: Orthopedic Surgery

## 2019-04-25 DIAGNOSIS — G8929 Other chronic pain: Secondary | ICD-10-CM

## 2019-04-25 DIAGNOSIS — Z8781 Personal history of (healed) traumatic fracture: Secondary | ICD-10-CM

## 2019-04-25 DIAGNOSIS — M25552 Pain in left hip: Secondary | ICD-10-CM

## 2019-04-25 DIAGNOSIS — Z9889 Other specified postprocedural states: Secondary | ICD-10-CM

## 2019-05-16 ENCOUNTER — Other Ambulatory Visit: Payer: Self-pay

## 2019-05-16 ENCOUNTER — Encounter (HOSPITAL_COMMUNITY): Payer: Self-pay

## 2019-05-17 ENCOUNTER — Encounter (HOSPITAL_COMMUNITY)
Admission: RE | Admit: 2019-05-17 | Discharge: 2019-05-17 | Disposition: A | Discharge status: Home / Self Care | Payer: Medicare Other | Source: Ambulatory Visit | Attending: Gastroenterology | Admitting: Gastroenterology

## 2019-05-19 ENCOUNTER — Other Ambulatory Visit: Payer: Self-pay

## 2019-05-19 ENCOUNTER — Other Ambulatory Visit (HOSPITAL_COMMUNITY)
Admission: RE | Admit: 2019-05-19 | Discharge: 2019-05-19 | Disposition: A | Discharge status: Home / Self Care | Payer: Medicare Other | Source: Ambulatory Visit | Attending: Gastroenterology | Admitting: Gastroenterology

## 2019-05-19 DIAGNOSIS — Z01812 Encounter for preprocedural laboratory examination: Secondary | ICD-10-CM | POA: Diagnosis present

## 2019-05-19 DIAGNOSIS — Z1159 Encounter for screening for other viral diseases: Secondary | ICD-10-CM | POA: Diagnosis not present

## 2019-05-20 LAB — NOVEL CORONAVIRUS, NAA (HOSP ORDER, SEND-OUT TO REF LAB; TAT 18-24 HRS): SARS-CoV-2, NAA: NOT DETECTED

## 2019-05-22 ENCOUNTER — Telehealth: Payer: Self-pay | Admitting: *Deleted

## 2019-05-22 NOTE — Telephone Encounter (Signed)
Called patient. She has moved procedure time up on 6/16/ to 11:00am. Patient aware to arrive at 9:30am. Patient aware can have clear liquids until 7am and nothing after this point tomorrow AM. Called endo and LMOVM making aware

## 2019-05-23 ENCOUNTER — Encounter (HOSPITAL_COMMUNITY)
Admission: RE | Disposition: A | Discharge status: Home / Self Care | Payer: Self-pay | Source: Home / Self Care | Attending: Gastroenterology

## 2019-05-23 ENCOUNTER — Ambulatory Visit (HOSPITAL_COMMUNITY)
Admission: RE | Admit: 2019-05-23 | Discharge: 2019-05-23 | Disposition: A | Discharge status: Home / Self Care | Payer: Medicare Other | Attending: Gastroenterology | Admitting: Gastroenterology

## 2019-05-23 ENCOUNTER — Other Ambulatory Visit: Payer: Self-pay

## 2019-05-23 ENCOUNTER — Ambulatory Visit (HOSPITAL_COMMUNITY): Payer: Medicare Other | Admitting: Anesthesiology

## 2019-05-23 ENCOUNTER — Encounter (HOSPITAL_COMMUNITY): Payer: Self-pay | Admitting: Anesthesiology

## 2019-05-23 DIAGNOSIS — I10 Essential (primary) hypertension: Secondary | ICD-10-CM | POA: Diagnosis not present

## 2019-05-23 DIAGNOSIS — Z980 Intestinal bypass and anastomosis status: Secondary | ICD-10-CM | POA: Diagnosis not present

## 2019-05-23 DIAGNOSIS — D649 Anemia, unspecified: Secondary | ICD-10-CM | POA: Diagnosis not present

## 2019-05-23 DIAGNOSIS — R1314 Dysphagia, pharyngoesophageal phase: Secondary | ICD-10-CM | POA: Insufficient documentation

## 2019-05-23 DIAGNOSIS — M545 Low back pain: Secondary | ICD-10-CM | POA: Diagnosis not present

## 2019-05-23 DIAGNOSIS — K219 Gastro-esophageal reflux disease without esophagitis: Secondary | ICD-10-CM | POA: Diagnosis not present

## 2019-05-23 DIAGNOSIS — Q394 Esophageal web: Secondary | ICD-10-CM

## 2019-05-23 DIAGNOSIS — M1990 Unspecified osteoarthritis, unspecified site: Secondary | ICD-10-CM | POA: Insufficient documentation

## 2019-05-23 DIAGNOSIS — Z7982 Long term (current) use of aspirin: Secondary | ICD-10-CM | POA: Insufficient documentation

## 2019-05-23 DIAGNOSIS — R1310 Dysphagia, unspecified: Secondary | ICD-10-CM | POA: Diagnosis not present

## 2019-05-23 DIAGNOSIS — R1319 Other dysphagia: Secondary | ICD-10-CM

## 2019-05-23 DIAGNOSIS — Z79899 Other long term (current) drug therapy: Secondary | ICD-10-CM | POA: Insufficient documentation

## 2019-05-23 DIAGNOSIS — Z9884 Bariatric surgery status: Secondary | ICD-10-CM | POA: Diagnosis not present

## 2019-05-23 HISTORY — PX: SAVORY DILATION: SHX5439

## 2019-05-23 HISTORY — PX: ESOPHAGOGASTRODUODENOSCOPY (EGD) WITH PROPOFOL: SHX5813

## 2019-05-23 SURGERY — ESOPHAGOGASTRODUODENOSCOPY (EGD) WITH PROPOFOL
Anesthesia: General

## 2019-05-23 MED ORDER — LIDOCAINE VISCOUS HCL 2 % MT SOLN: 5.0000 mL | Freq: Once | OROMUCOSAL | Status: DC

## 2019-05-23 MED ORDER — PROPOFOL 500 MG/50ML IV EMUL
INTRAVENOUS | Status: DC | PRN
  Administered 2019-05-23: 150 ug/kg/min via INTRAVENOUS

## 2019-05-23 MED ORDER — MINERAL OIL PO OIL
TOPICAL_OIL | ORAL | Status: AC
  Filled 2019-05-23: qty 30

## 2019-05-23 MED ORDER — PROMETHAZINE HCL 25 MG/ML IJ SOLN: 6.2500 mg | INTRAMUSCULAR | Status: DC | PRN

## 2019-05-23 MED ORDER — MIDAZOLAM HCL 2 MG/2ML IJ SOLN: 0.5000 mg | Freq: Once | INTRAMUSCULAR | Status: DC | PRN

## 2019-05-23 MED ORDER — LIDOCAINE VISCOUS HCL 2 % MT SOLN
OROMUCOSAL | Status: AC
  Filled 2019-05-23: qty 15

## 2019-05-23 MED ORDER — KETAMINE HCL 50 MG/5ML IJ SOSY
PREFILLED_SYRINGE | INTRAMUSCULAR | Status: AC
  Filled 2019-05-23: qty 5

## 2019-05-23 MED ORDER — CHLORHEXIDINE GLUCONATE CLOTH 2 % EX PADS: 6.0000 | MEDICATED_PAD | Freq: Once | CUTANEOUS | Status: DC

## 2019-05-23 MED ORDER — PROPOFOL 10 MG/ML IV BOLUS
INTRAVENOUS | Status: DC | PRN
  Administered 2019-05-23 (×3): 20 mg via INTRAVENOUS

## 2019-05-23 MED ORDER — KETAMINE HCL 100 MG/ML IJ SOLN
INTRAMUSCULAR | Status: DC | PRN
  Administered 2019-05-23: 10 mg via INTRAVENOUS

## 2019-05-23 MED ORDER — LACTATED RINGERS IV SOLN
INTRAVENOUS | Status: DC
  Administered 2019-05-23: 10:00:00 via INTRAVENOUS

## 2019-05-23 MED ORDER — PROPOFOL 10 MG/ML IV BOLUS
INTRAVENOUS | Status: AC
  Filled 2019-05-23: qty 40

## 2019-05-23 MED ORDER — LIDOCAINE VISCOUS HCL 2 % MT SOLN
OROMUCOSAL | Status: DC | PRN
  Administered 2019-05-23: 1 via OROMUCOSAL

## 2019-05-23 NOTE — Op Note (Signed)
[facility] Patient Name: Ellen Anderson Procedure Date: 05/23/2019 10:44 AM MRN: [id number] Date of Birth: [birth year] Attending MD: Barney Drain MD, MD CSN: [id number] Age: 73 Admit Type: Outpatient Procedure:                Upper GI endoscopy WITH ESOPHAGEAL DILATION Indications:              Dysphagia Providers:                Barney Drain MD, MD, Janeece Riggers, RN, Aram Candela Referring MD:             Teodora Medici. Kim Medicines:                Propofol per Anesthesia Complications:            No immediate complications. Estimated Blood Loss:     Estimated blood loss was minimal. Procedure:                Pre-Anesthesia Assessment:                           - Prior to the procedure, a History and Physical                            was performed, and patient medications and                            allergies were reviewed. The patient's tolerance of                            previous anesthesia was also reviewed. The risks                            and benefits of the procedure and the sedation                            options and risks were discussed with the patient.                            All questions were answered, and informed consent                            was obtained. Prior Anticoagulants: The patient has                            taken no previous anticoagulant or antiplatelet                            agents except for aspirin. ASA Grade Assessment: II                            - A patient with mild systemic disease. After                            reviewing the risks and benefits, the patient was  deemed in satisfactory condition to undergo the                            procedure. After obtaining informed consent, the                            endoscope was passed under direct vision.                            Throughout the procedure, the patient's blood                            pressure, pulse, and oxygen saturations  were                            monitored continuously. The GIF-H190 ([id number])                            scope was introduced through the mouth, and                            advanced to the second part of duodenum. The upper                            GI endoscopy was accomplished without difficulty.                            The patient tolerated the procedure well. Scope In: 11:34:56 AM Scope Out: 11:39:39 AM Total Procedure Duration: 0 hours 4 minutes 43 seconds  Findings:      A web was found in the proximal esophagus. A guidewire was placed and       the scope was withdrawn. Dilation was performed with a Savary dilator       with mild resistance at 15 mm and 16 mm. Estimated blood loss was       minimal.      The entire examined GASTRIC REMNANT IS normal. THE ANASTOMOSIS IS NORMAL      The examined jejunum was normal. 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                            Dilated. Moderate Sedation:      Per Anesthesia Care Recommendation:           - Patient has a contact number available for                            emergencies. The signs and symptoms of potential                            delayed complications were discussed with the                            patient. Return to normal activities tomorrow.  Written discharge instructions were provided to the                            patient.                           - Resume previous diet. PT SHOULD STRICTLY ADHERE                            TO A GASTRIC BYPASS DIET.                           - Continue present medications.                           - Return to GI office in 4 months. Procedure Code(s):        --- Professional ---                           [phone], Esophagogastroduodenoscopy, flexible,                            transoral; with insertion of guide wire followed by                            passage of dilator(s) through esophagus  over guide                            wire Diagnosis Code(s):        --- Professional ---                           Q39.4, Esophageal web                           R13.10, Dysphagia, unspecified CPT copyright 2019 [employer] Association. All rights reserved. The codes documented in this report are preliminary and upon coder review may  be revised to meet current compliance requirements. Barney Drain, MD Barney Drain MD, MD 05/23/2019 11:55:16 AM This report has been signed electronically. Number of Addenda: 0

## 2019-05-23 NOTE — Transfer of Care (Signed)
Immediate Anesthesia Transfer of Care Note  Patient: Ellen Anderson  Procedure(s) Performed: ESOPHAGOGASTRODUODENOSCOPY (EGD) WITH PROPOFOL (N/A ) SAVORY DILATION (N/A )  Patient Location: PACU  Anesthesia Type:General  Level of Consciousness: awake  Airway & Oxygen Therapy: Patient Spontanous Breathing  Post-op Assessment: Report given to RN  Post vital signs: Reviewed  Last Vitals:  Vitals Value Taken Time  BP 141/113 05/23/19 1149  Temp    Pulse 76 05/23/19 1152  Resp 18 05/23/19 1152  SpO2 100 % 05/23/19 1152  Vitals shown include unvalidated device data.  Last Pain:  Vitals:   05/23/19 1008  TempSrc: Oral  PainSc: 0-No pain         Complications: No apparent anesthesia complications

## 2019-05-23 NOTE — Anesthesia Postprocedure Evaluation (Signed)
Anesthesia Post Note  Patient: Jeanella Flattery  Procedure(s) Performed: ESOPHAGOGASTRODUODENOSCOPY (EGD) WITH PROPOFOL (N/A ) SAVORY DILATION (N/A )  Patient location during evaluation: PACU Anesthesia Type: General Level of consciousness: awake and alert and oriented Pain management: pain level controlled Vital Signs Assessment: post-procedure vital signs reviewed and stable Respiratory status: spontaneous breathing Cardiovascular status: blood pressure returned to baseline and stable Postop Assessment: no apparent nausea or vomiting Anesthetic complications: no     Last Vitals:  Vitals:   05/23/19 1008  Pulse: 88  Resp: 20  Temp: 36.8 C  SpO2: 97%    Last Pain:  Vitals:   05/23/19 1008  TempSrc: Oral  PainSc: 0-No pain                 Franciscojavier Wronski

## 2019-05-23 NOTE — Anesthesia Preprocedure Evaluation (Addendum)
Anesthesia Evaluation  Patient identified by MRN, date of birth, ID band Patient awake    Reviewed: Allergy & Precautions, NPO status , Patient's Chart, lab work & pertinent test results  History of Anesthesia Complications (+) PONV  Airway Mallampati: II  TM Distance: >3 FB Neck ROM: Full    Dental no notable dental hx. (+) Edentulous Upper, Edentulous Lower   Pulmonary neg pulmonary ROS,    Pulmonary exam normal breath sounds clear to auscultation       Cardiovascular Exercise Tolerance: Good hypertension, Pt. on medications negative cardio ROS Normal cardiovascular examI Rhythm:Regular Rate:Normal  States ET limited by Bilat hip issues  Denies CP/DOE Echo 2017 EF 55-60-hypertrophic Cardiomyopathy    Neuro/Psych Seizures -, Well Controlled,  negative psych ROS   GI/Hepatic Neg liver ROS, GERD  Medicated and Controlled,Denies GERD Sx today -took prilosec today   Endo/Other  negative endocrine ROS  Renal/GU negative Renal ROS  negative genitourinary   Musculoskeletal  (+) Arthritis , Osteoarthritis,    Abdominal   Peds negative pediatric ROS (+)  Hematology negative hematology ROS (+) anemia ,   Anesthesia Other Findings   Reproductive/Obstetrics negative OB ROS                             Anesthesia Physical Anesthesia Plan  ASA: III  Anesthesia Plan: General   Post-op Pain Management:    Induction: Intravenous  PONV Risk Score and Plan: 4 or greater and Dexamethasone, Propofol infusion, TIVA, Treatment may vary due to age or medical condition and Ondansetron  Airway Management Planned: Simple Face Mask and Nasal Cannula  Additional Equipment:   Intra-op Plan:   Post-operative Plan: Extubation in OR  Informed Consent: I have reviewed the patients History and Physical, chart, labs and discussed the procedure including the risks, benefits and alternatives for the  proposed anesthesia with the patient or authorized representative who has indicated his/her understanding and acceptance.     Dental advisory given  Plan Discussed with: CRNA  Anesthesia Plan Comments: (Plan Full PPE use  Plan GA with GETA back up as needed -WTP with same after Q&A)       Anesthesia Quick Evaluation

## 2019-05-23 NOTE — H&P (Signed)
Primary Care Physician:  Jani Gravel, MD Primary Gastroenterologist:  Dr. Oneida Alar  Pre-Procedure History & Physical: HPI:  Ellen Anderson is a 73 y.o. female here for [place].  Past Medical History:  Diagnosis Date  . Anemia   . Chronic low back pain   . Degenerative joint disease   . Hip fracture, left ([place]) 03/12/2012  . Hypertension   . PONV (postoperative nausea and vomiting)     Past Surgical History:  Procedure Laterality Date  . ABDOMINAL HYSTERECTOMY    . ANKLE SURGERY Left    pins and screws  . APPENDECTOMY    . BACK SURGERY    . CATARACT EXTRACTION W/PHACO Right 02/18/2015   Procedure: CATARACT EXTRACTION PHACO AND INTRAOCULAR LENS PLACEMENT RIGHT EYE; CDE:  10.48;  Surgeon: Tonny Branch, MD;  Location: [facility];  Service: Ophthalmology;  Laterality: Right;  . CATARACT EXTRACTION W/PHACO Left 03/07/2015   Procedure: CATARACT EXTRACTION PHACO AND INTRAOCULAR LENS PLACEMENT (IOC);  Surgeon: Tonny Branch, MD;  Location: [facility];  Service: Ophthalmology;  Laterality: Left;  CDE:7.14  . CHOLECYSTECTOMY    . COLONOSCOPY N/A 03/04/2016   Procedure: COLONOSCOPY;  Surgeon: Danie Binder, MD;  Location: [facility];  Service: Endoscopy;  Laterality: N/A;  930   . ESOPHAGOGASTRODUODENOSCOPY  07/23/2004   Dr. Rehman:foreign body impactedat cervical esophagus PORK CHOP, dilation of esophageal web  . ESOPHAGOGASTRODUODENOSCOPY  2011   Dr. Oneida Alar: food bolus which would not pass through gastric remnant into jejunostomy, PORK CHOP removed. AVOID PORK CHOPS  . ESOPHAGOGASTRODUODENOSCOPY N/A 03/04/2016   Procedure: ESOPHAGOGASTRODUODENOSCOPY (EGD);  Surgeon: Danie Binder, MD;  Location: [facility];  Service: Endoscopy;  Laterality: N/A;  . GASTRIC BYPASS  1981  . HIP SURGERY Bilateral    fractured bilateral hips  . INTRAMEDULLARY (IM) NAIL INTERTROCHANTERIC Right 03/08/2013   Procedure: INTRAMEDULLARY (IM) NAIL INTERTROCHANTRIC;  Surgeon: Carole Civil, MD;  Location: [facility];  Service:  Orthopedics;  Laterality: Right;  . SAVORY DILATION N/A 03/04/2016   Procedure: SAVORY DILATION;  Surgeon: Danie Binder, MD;  Location: [facility];  Service: Endoscopy;  Laterality: N/A;    Prior to Admission medications   Medication Sig Start Date End Date Taking? Authorizing Provider  AMITIZA 8 MCG capsule TAKE 1 CAPSULE BY MOUTH TWICE DAILY WITH FOOD Patient taking differently: Take 8 mcg by mouth 2 (two) times daily with a meal.  08/17/17  Yes Carlis Stable, NP  aspirin 81 MG EC tablet Take 81 mg by mouth daily.    Yes [provider]  diazepam (VALIUM) 10 MG tablet Take 1 tablet (10 mg total) by mouth every 8 (eight) hours as needed. For muscle spasms 03/10/13  Yes Carole Civil, MD  diphenhydrAMINE (BENADRYL) 25 mg capsule Take 75 mg by mouth every 6 (six) hours as needed for itching. Takes 3 before and 3 after taking taking Oxycodone    Yes [provider]  magnesium oxide (MAG-OX) 400 MG tablet Take 400 mg by mouth daily.   Yes [provider]  omeprazole (PRILOSEC) 20 MG capsule TAKE 1 CAPSULE BY MOUTH DAILY BEFORE BREAKFAST Patient taking differently: Take 20 mg by mouth daily before breakfast.  10/03/18  Yes Annitta Needs, NP  oxyCODONE (OXY IR/ROXICODONE) 5 MG immediate release tablet Take 1 tablet by mouth every 6 (six) hours as needed (pain).  02/11/19  Yes [provider]  promethazine (PHENERGAN) 12.5 MG tablet Take 12.5 mg by mouth every 6 (six) hours  as needed for nausea or vomiting.   Yes [provider]  Propylene Glycol (SYSTANE BALANCE) 0.6 % SOLN Place 1 drop into both eyes daily as needed (dry eyes).   Yes [provider]  denosumab (PROLIA) 60 MG/ML SOLN injection Inject 60 mg into the skin every 6 (six) months. Administer in upper arm, thigh, or abdomen    [provider]  lubiprostone (AMITIZA) 8 MCG capsule TAKE 1 CAPSULE BY MOUTH TWICE DAILY WITH FOOD Patient not taking: Reported on 05/12/2019 08/17/17    Carlis Stable, NP    Allergies as of 02/14/2019 - Review Complete 02/14/2019  Allergen Reaction Noted  . Amoxicillin-pot clavulanate Swelling 12/25/2011  . Ceclor [cefaclor] Swelling 12/25/2011  . Morphine and related Other (See Comments) 12/25/2011  . Sulfur Shortness Of Breath 12/25/2011  . Azithromycin Other (See Comments) 12/26/2011  . Flexeril [cyclobenzaprine] Hives and Swelling 02/20/2016  . Ivp dye [iodinated diagnostic agents] Other (See Comments) 12/25/2011  . Methadone Other (See Comments) 12/25/2011  . Percocet [oxycodone-acetaminophen] Itching 12/25/2011  . Betadine [povidone iodine] Rash 02/18/2015  . Codeine Hives and Rash 12/25/2011  . Escitalopram oxalate Other (See Comments) 12/25/2011  . Ibuprofen Hives and Rash 12/26/2011  . Tylenol [acetaminophen] Hives and Rash 12/26/2011    Family History  Problem Relation Age of Onset  . Throat cancer Mother   . COPD Father   . Stomach cancer Sister   . COPD Sister   . Colon polyps Sister   . COPD Sister   . Colon cancer Neg Hx   . Esophageal cancer Neg Hx     Social History   Socioeconomic History  . Marital status: Widowed    Spouse name: Not on file  . Number of children: Not on file  . Years of education: Not on file  . Highest education level: Not on file  Occupational History  . Not on file  Social Needs  . Financial resource strain: Not on file  . Food insecurity    Worry: Not on file    Inability: Not on file  . Transportation needs    Medical: Not on file    Non-medical: Not on file  Tobacco Use  . Smoking status: Never Smoker  . Smokeless tobacco: Never Used  Substance and Sexual Activity  . Alcohol use: No    Alcohol/week: 0.0 standard drinks  . Drug use: No  . Sexual activity: Never    Birth control/protection: Surgical  Lifestyle  . Physical activity    Days per week: Not on file    Minutes per session: Not on file  . Stress: Not on file  Relationships  . Social Product manager on phone: Not on file    Gets together: Not on file    Attends religious service: Not on file    Active member of club or organization: Not on file    Attends meetings of clubs or organizations: Not on file    Relationship status: Not on file  . Intimate partner violence    Fear of current or ex partner: Not on file    Emotionally abused: Not on file    Physically abused: Not on file    Forced sexual activity: Not on file  Other Topics Concern  . Not on file  Social History Narrative  . Not on file    Review of Systems: See HPI, otherwise negative ROS   Physical Exam: Pulse 88   Temp 98.3 F (36.8  C) (Oral)   Resp 20   Ht 5\' 3"  (1.6 m)   Wt 69.9 kg   SpO2 97%   BMI 27.28 kg/m  General:   Alert,  pleasant and cooperative in NAD Head:  Normocephalic and atraumatic. Neck:  Supple; Lungs:  Clear throughout to auscultation.    Heart:  Regular rate and rhythm. Abdomen:  Soft, nontender and nondistended. Normal bowel sounds, without guarding, and without rebound.   Neurologic:  Alert and  oriented x4;  grossly normal neurologically.  Impression/Plan:    DYSPHAGIA  PLAN:  EGD/DIL TODAY. DISCUSSED PROCEDURE, BENEFITS, & RISKS: < 1% chance of medication reaction, ASPIRATION, bleeding, OR perforation.

## 2019-05-23 NOTE — Discharge Instructions (Signed)
I dilated your esophagus. 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 YOUR STOMACH AND SMALL BOWEL ARE NORMAL.   DRINK WATER TO KEEP YOUR URINE LIGHT YELLOW.  AVOID REFLUX TRIGGERS. SEE INFO BELOW.  STRICTLY FOLLOW A LOW FAT/GASTRIC BYPASS DIET DIET. SEE INFO BELOW.  CONTINUE OMEPRAZOLE.  TAKE 30 MINUTES PRIOR TO YOUR FIRST MEAL.  FOLLOW UP IN 4 MOS.   UPPER ENDOSCOPY AFTER CARE Read the instructions outlined below and refer to this sheet in the next week. These discharge instructions provide you with general information on caring for yourself after you leave the hospital. While your treatment has been planned according to the most current medical practices available, unavoidable complications occasionally occur. If you have any problems or questions after discharge, call DR. Aretta Stetzel, [phone].  ACTIVITY  You may resume your regular activity, but move at a slower pace for the next 24 hours.   Take frequent rest periods for the next 24 hours.   Walking will help get rid of the air and reduce the bloated feeling in your belly (abdomen).   No driving for 24 hours (because of the medicine (anesthesia) used during the test).   You may shower.   Do not sign any important legal documents or operate any machinery for 24 hours (because of the anesthesia used during the test).    NUTRITION  Drink plenty of fluids.   You may resume your normal diet as instructed by your doctor.   Begin with a light meal and progress to your normal diet. Heavy or fried foods are harder to digest and may make you feel sick to your stomach (nauseated).   Avoid alcoholic beverages for 24 hours or as instructed.    MEDICATIONS  You may resume your normal medications.   WHAT YOU CAN EXPECT TODAY  Some feelings of bloating in the abdomen.   Passage of more gas than usual.    IF YOU HAD A BIOPSY TAKEN DURING THE UPPER ENDOSCOPY:  Eat a soft diet IF YOU HAVE NAUSEA, BLOATING,  ABDOMINAL PAIN, OR VOMITING.    FINDING OUT THE RESULTS OF YOUR TEST Not all test results are available during your visit. DR. Oneida Alar WILL CALL YOU WITHIN 14 DAYS OF YOUR PROCEDUE WITH YOUR RESULTS. Do not assume everything is normal if you have not heard from DR. Shanina Kepple, CALL HER OFFICE AT [phone].  SEEK IMMEDIATE MEDICAL ATTENTION AND CALL THE OFFICE: [phone] IF:  You have more than a spotting of blood in your stool.   Your belly is swollen (abdominal distention).   You are nauseated or vomiting.   You have a temperature over 101F.   You have abdominal pain or discomfort that is severe or gets worse throughout the day.  ESOPHAGEAL STRICTURE  Esophageal strictures can be caused by stomach acid backing up into the tube that carries food from the mouth down to the stomach (lower esophagus).  TREATMENT There are a number of  medicines used to treat reflux/stricture, including: Antacids.  Proton-pump inhibitors: OMEPRAZOLE PEPCID OR TAGAMET      Lifestyle and home remedies TO CONTROL REFLUX/HEARTBURN You may eliminate or reduce the frequency of heartburn by making the following lifestyle changes:   Control your weight. Being overweight is a major risk factor for heartburn and GERD. Excess pounds put pressure on your abdomen, pushing up your stomach and causing acid to back up into your esophagus.    Eat smaller meals. 4 TO 6 MEALS A DAY. This reduces  pressure on the lower esophageal sphincter, helping to prevent the valve from opening and acid from washing back into your esophagus.    Loosen your belt. Clothes that fit tightly around your waist put pressure on your abdomen and the lower esophageal sphincter.    Eliminate heartburn triggers. Everyone has specific triggers. Common triggers such as fatty or fried foods, spicy food, tomato sauce, carbonated beverages, alcohol, chocolate, mint, garlic, onion, caffeine and nicotine may make heartburn worse.    Avoid  stooping or bending. Tying your shoes is OK. Bending over for longer periods to weed your garden isn't, especially soon after eating.    Don't lie down after a meal. Wait at least three to four hours after eating before going to bed, and don't lie down right after eating.    Alternative medicine  Several home remedies exist for treating GERD, but they provide only temporary relief. They include drinking baking soda (sodium bicarbonate) added to water or drinking other fluids such as baking soda mixed with cream of tartar and water.   Although these liquids create temporary relief by neutralizing, washing away or buffering acids, eventually they aggravate the situation by adding gas and fluid to your stomach, increasing pressure and causing more acid reflux. Further, adding more sodium to your diet may increase your blood pressure and add stress to your heart, and excessive bicarbonate ingestion can alter the acid-base balance in your body.

## 2019-05-29 ENCOUNTER — Encounter (HOSPITAL_COMMUNITY): Payer: Self-pay | Admitting: Gastroenterology

## 2019-06-19 ENCOUNTER — Ambulatory Visit: Payer: Medicare Other | Admitting: Nurse Practitioner

## 2019-07-03 ENCOUNTER — Encounter: Payer: Self-pay | Admitting: Orthopedic Surgery

## 2019-07-03 ENCOUNTER — Other Ambulatory Visit: Payer: Self-pay

## 2019-07-03 ENCOUNTER — Other Ambulatory Visit: Payer: Self-pay | Admitting: Radiology

## 2019-07-03 ENCOUNTER — Ambulatory Visit (INDEPENDENT_AMBULATORY_CARE_PROVIDER_SITE_OTHER): Payer: Medicare Other | Admitting: Orthopedic Surgery

## 2019-07-03 VITALS — BP 117/90 | HR 94 | Temp 97.9°F | Ht 63.0 in | Wt 145.0 lb

## 2019-07-03 DIAGNOSIS — M25552 Pain in left hip: Secondary | ICD-10-CM | POA: Diagnosis not present

## 2019-07-03 DIAGNOSIS — M961 Postlaminectomy syndrome, not elsewhere classified: Secondary | ICD-10-CM

## 2019-07-03 DIAGNOSIS — G8929 Other chronic pain: Secondary | ICD-10-CM

## 2019-07-03 MED ORDER — PROMETHAZINE HCL 12.5 MG PO TABS: 12.5000 mg | ORAL_TABLET | Freq: Four times a day (QID) | ORAL | 0 refills | Status: DC | PRN

## 2019-07-03 NOTE — Patient Instructions (Signed)

## 2019-07-03 NOTE — Progress Notes (Signed)
Chief Complaint  Patient presents with  . Hip Pain    left wants injection    Encounter Diagnoses  Name Primary?  . Failed back syndrome of lumbar spine Yes  . Chronic left hip pain     Procedure note injection for left hip bursitis  Verbal consent was obtained for injection of the  left hip   Timeout was completed to confirm the injection site  The medications used were 40 mg of Depo-Medrol and 1% lidocaine 3 cc  Anesthesia was provided by ethyl chloride and the skin was prepped with alcohol.  After cleaning the skin with alcohol a 25-gauge needle was used to inject the left hip greater trochanteric bursa  FU PRN

## 2019-07-04 ENCOUNTER — Ambulatory Visit: Payer: Medicare Other | Admitting: Gastroenterology

## 2019-07-17 ENCOUNTER — Telehealth: Payer: Self-pay | Admitting: Orthopedic Surgery

## 2019-07-17 NOTE — Telephone Encounter (Signed)
Thank you for telling her to call EMS, that is appropriate.

## 2019-07-17 NOTE — Telephone Encounter (Signed)
Spoke with patient; states she will be going on to the Emergency room; states getting a ride there as is concerned about the fee for EMS. Aware to go directly there.

## 2019-07-17 NOTE — Telephone Encounter (Signed)
Call received from patient 1:18pm today. States aware not an orthopaedic problem but she said she was stung by a wasp about 40 minutes ago, and since then, started experiencing redness of hands and around waist, and also now having some chest pain. [name] already called [facility] but said does not want to go to the hospital. [name] called her primary care at [facility], and someone is to call her back.  I relayed I am sending the message to clinical staff however if having chest pain may need to call EMS

## 2019-08-15 ENCOUNTER — Encounter: Payer: Self-pay | Admitting: Nurse Practitioner

## 2019-08-15 ENCOUNTER — Ambulatory Visit (INDEPENDENT_AMBULATORY_CARE_PROVIDER_SITE_OTHER): Payer: Medicare Other | Admitting: Nurse Practitioner

## 2019-08-15 ENCOUNTER — Other Ambulatory Visit: Payer: Self-pay

## 2019-08-15 VITALS — BP 107/76 | HR 76 | Temp 96.2°F | Ht 63.0 in | Wt 146.4 lb

## 2019-08-15 DIAGNOSIS — R1310 Dysphagia, unspecified: Secondary | ICD-10-CM | POA: Diagnosis not present

## 2019-08-15 DIAGNOSIS — K5900 Constipation, unspecified: Secondary | ICD-10-CM

## 2019-08-15 DIAGNOSIS — R1319 Other dysphagia: Secondary | ICD-10-CM

## 2019-08-15 NOTE — Assessment & Plan Note (Signed)
Dysphagia is improved status post dilation.  Recommend she continue her current medications and monitor for any recurrence.  Her last EGD was 3 years ago.  I feel she will likely need another endoscopic evaluation in the future with possible dilation, but I will reinforce dysphagia precautions with her to help lengthen the amount of time in between required EGDs.  Follow-up in 6 months.

## 2019-08-15 NOTE — Patient Instructions (Signed)
Your health issues we discussed today were:   Dysphagia (swallowing difficulties): 1. Continue to follow recommendations including chewing food adequately, eating soft foods, etc. to help prevent ongoing swallowing difficulties 2. If you develop worsening swallowing problems notify us and we can further evaluate  Constipation: 1. It seems Amitiza is working well for your constipation 2. Continue to take this 3. You can take a laxative here and there as needed for "bad days". 4. Call us if you have any worsening or severe symptoms  Overall I recommend:  1. Continue your other current medications 2. Return for follow-up in 6 months 3. Call us if you have any questions or concerns.   Because of recent events of COVID-19 ("Coronavirus"), follow CDC recommendations:  1. Wash your hand frequently 2. Avoid touching your face 3. Stay away from people who are sick 4. If you have symptoms such as fever, cough, shortness of breath then call your healthcare provider for further guidance 5. If you are sick, STAY AT HOME unless otherwise directed by your healthcare provider. 6. Follow directions from state and national officials regarding staying safe   At [facility] we value your feedback. You may receive a survey about your visit today. Please share your experience as we strive to create trusting relationships with our patients to provide genuine, compassionate, quality care.  We appreciate your understanding and patience as we review any laboratory studies, imaging, and other diagnostic tests that are ordered as we care for you. Our office policy is 5 business days for review of these results, and any emergent or urgent results are addressed in a timely manner for your best interest. If you do not hear from our office in 1 week, please contact us.   We also encourage the use of MyChart, which contains your medical information for your review as well. If you are not enrolled in  this feature, an access code is on this after visit summary for your convenience. Thank you for allowing [place] to be involved in your care.  It was great to see you today!  I hope you have a great Fall!!

## 2019-08-15 NOTE — Progress Notes (Signed)
Referring Provider: Jani Gravel, MD Primary Care Physician:  Jani Gravel, MD Primary GI:  Dr. Oneida Alar  Chief Complaint  Patient presents with   Dysphagia    occ    HPI:   Ellen Anderson is a 73 y.o. female who presents for follow-up on dysphasia.  The patient was last seen in our office 02/14/2019 for the same.  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  Colonoscopy up-to-date.  Previous EGD in 2017 which found a mild Schatzki's ring with a 5 cm gastric pouch.  At her last visit she noted dysphasia symptoms her whole life with multiple dilations.  Previous dilation worked well and resolved her symptoms for quite some time.  Still on omeprazole daily without overt GERD symptoms.  Solid food dysphagia intermittent.  Occasional pill dysphagia with aspirin.  No regurgitation.  Previous episode of solid food dysphagia occurred for 15 minutes and attempted fluids just backflow into her nasal passages.  No other GI complaints.  Recommended endoscopy with possible dilation.  Continue current PPI.  Follow-up in 4 months.  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  Recommended strictly adhere to a gastric bypass diet, continue current medications.  Today she states she's doing ok overall. Solid food dysphagia is better, is taking time to chew adequately, eating soft foods. Occasionally she'll have a pill get stuck but thinks it's getting stuck but she thinks this is getting stuck on her enlarged tonsil. Denies GERD symptoms, abdominal pain, N/V, hematochezia, melena, fever, chills, unintentional weight loss.  She has chronic constipation which is generally well managed by Amitiza 8 mcg twice daily.  About every 6 to 8 weeks she will need to take a one-time laxative, otherwise effective management.  Denies URI or flu-like symptoms. Denies loss of sense of taste or smell. Denies chest pain, dyspnea, dizziness, lightheadedness, syncope, near  syncope. Denies any other upper or lower GI symptoms.  Past Medical History:  Diagnosis Date   Anemia    Chronic low back pain    Degenerative joint disease    Hip fracture, left ([place]) 03/12/2012   Hypertension    PONV (postoperative nausea and vomiting)     Past Surgical History:  Procedure Laterality Date   ABDOMINAL HYSTERECTOMY     ANKLE SURGERY Left    pins and screws   APPENDECTOMY     BACK SURGERY     CATARACT EXTRACTION W/PHACO Right 02/18/2015   Procedure: CATARACT EXTRACTION PHACO AND INTRAOCULAR LENS PLACEMENT RIGHT EYE; CDE:  10.48;  Surgeon: Tonny Branch, MD;  Location: [facility];  Service: Ophthalmology;  Laterality: Right;   CATARACT EXTRACTION W/PHACO Left 03/07/2015   Procedure: CATARACT EXTRACTION PHACO AND INTRAOCULAR LENS PLACEMENT (IOC);  Surgeon: Tonny Branch, MD;  Location: [facility];  Service: Ophthalmology;  Laterality: Left;  CDE:7.14   CHOLECYSTECTOMY     COLONOSCOPY N/A 03/04/2016   Procedure: COLONOSCOPY;  Surgeon: Danie Binder, MD;  [facility]: [facility];  Service: Endoscopy;  Laterality: N/A;  930    ESOPHAGOGASTRODUODENOSCOPY  07/23/2004   Dr. Rehman:foreign body impactedat cervical esophagus PORK CHOP, dilation of esophageal web   ESOPHAGOGASTRODUODENOSCOPY  2011   Dr. Oneida Alar: food bolus which would not pass through gastric remnant into jejunostomy, PORK CHOP removed. AVOID PORK CHOPS   ESOPHAGOGASTRODUODENOSCOPY N/A 03/04/2016   Procedure: ESOPHAGOGASTRODUODENOSCOPY (EGD);  Surgeon: Danie Binder, MD;  [facility];  Service: Endoscopy;  Laterality: N/A;  ESOPHAGOGASTRODUODENOSCOPY (EGD) WITH PROPOFOL N/A 05/23/2019   Procedure: ESOPHAGOGASTRODUODENOSCOPY (EGD) WITH PROPOFOL;  Surgeon: Danie Binder, MD;  [facility];  Service: Endoscopy;  Laterality: N/A;  1:45pm   GASTRIC BYPASS  1981   HIP SURGERY Bilateral    fractured bilateral hips   INTRAMEDULLARY (IM) NAIL INTERTROCHANTERIC Right 03/08/2013   Procedure:  INTRAMEDULLARY (IM) NAIL INTERTROCHANTRIC;  Surgeon: Carole Civil, MD;  Location: [facility];  Service: Orthopedics;  Laterality: Right;   SAVORY DILATION N/A 03/04/2016   Procedure: SAVORY DILATION;  Surgeon: Danie Binder, MD;  [facility];  Service: Endoscopy;  Laterality: N/A;   SAVORY DILATION N/A 05/23/2019   Procedure: SAVORY DILATION;  Surgeon: Danie Binder, MD;  [facility];  Service: Endoscopy;  Laterality: N/A;    Current Outpatient Medications  Medication Sig Dispense Refill   AMITIZA 8 MCG capsule TAKE 1 CAPSULE BY MOUTH TWICE DAILY WITH FOOD (Patient taking differently: Take 8 mcg by mouth 2 (two) times daily with a meal. ) 60 capsule 5   aspirin 81 MG EC tablet Take 81 mg by mouth daily.      denosumab (PROLIA) 60 MG/ML SOLN injection Inject 60 mg into the skin every 6 (six) months. Administer in upper arm, thigh, or abdomen     diazepam (VALIUM) 10 MG tablet Take 1 tablet (10 mg total) by mouth every 8 (eight) hours as needed. For muscle spasms 30 tablet 0   diphenhydrAMINE (BENADRYL) 25 mg capsule Take 75 mg by mouth every 6 (six) hours as needed for itching. Takes 3 before and 3 after taking taking Oxycodone      magnesium oxide (MAG-OX) 400 MG tablet Take 400 mg by mouth daily.     omeprazole (PRILOSEC) 20 MG capsule TAKE 1 CAPSULE BY MOUTH DAILY BEFORE BREAKFAST (Patient taking differently: Take 20 mg by mouth daily before breakfast. ) 90 capsule 3   oxyCODONE (OXY IR/ROXICODONE) 5 MG immediate release tablet Take 1 tablet by mouth every 6 (six) hours as needed (pain).      promethazine (PHENERGAN) 12.5 MG tablet Take 1 tablet (12.5 mg total) by mouth every 6 (six) hours as needed for nausea or vomiting. 30 tablet 0   Propylene Glycol (SYSTANE BALANCE) 0.6 % SOLN Place 1 drop into both eyes daily as needed (dry eyes).     No current facility-administered medications for this visit.     Allergies as of 08/15/2019 - Review Complete  08/15/2019  Allergen Reaction Noted   Amoxicillin-pot clavulanate Swelling 12/25/2011   Ceclor [cefaclor] Swelling 12/25/2011   Morphine and related Other (See Comments) 12/25/2011   Sulfur Shortness Of Breath 12/25/2011   Azithromycin Other (See Comments) 12/26/2011   Flexeril [cyclobenzaprine] Hives and Swelling 02/20/2016   Ivp dye [iodinated diagnostic agents] Other (See Comments) 12/25/2011   Methadone Other (See Comments) 12/25/2011   Percocet [oxycodone-acetaminophen] Itching 12/25/2011   Betadine [povidone iodine] Rash 02/18/2015   Codeine Hives and Rash 12/25/2011   Escitalopram oxalate Other (See Comments) 12/25/2011   Ibuprofen Hives and Rash 12/26/2011   Tylenol [acetaminophen] Hives and Rash 12/26/2011    Family History  Problem Relation Age of Onset   Throat cancer Mother    COPD Father    Stomach cancer Sister    COPD Sister    Colon polyps Sister    COPD Sister    Colon cancer Neg Hx    Esophageal cancer Neg Hx     Social History  Socioeconomic History   Marital status: Widowed    Spouse name: Not on file   Number of children: Not on file   Years of education: Not on file   Highest education level: Not on file  Occupational History   Not on file  Social Needs   Financial resource strain: Not on file   Food insecurity    Worry: Not on file    Inability: Not on file   Transportation needs    Medical: Not on file    Non-medical: Not on file  Tobacco Use   Smoking status: Never Smoker   Smokeless tobacco: Never Used  Substance and Sexual Activity   Alcohol use: No    Alcohol/week: 0.0 standard drinks   Drug use: No   Sexual activity: Never    Birth control/protection: Surgical  Lifestyle   Physical activity    Days per week: Not on file    Minutes per session: Not on file   Stress: Not on file  Relationships   Social connections    Talks on phone: Not on file    Gets together: Not on file    Attends  religious service: Not on file    Active member of club or organization: Not on file    Attends meetings of clubs or organizations: Not on file    Relationship status: Not on file  Other Topics Concern   Not on file  Social History Narrative   Not on file    Review of Systems: Complete ROS negative except as per HPI.   Physical Exam: BP 107/76    Pulse 76    Temp (!) 96.2 F (35.7 C) (Temporal)    Ht 5\' 3"  (1.6 m)    Wt 146 lb 6.4 oz (66.4 kg)    BMI 25.93 kg/m  General:   Alert and oriented. Pleasant and cooperative. Well-nourished and well-developed.  Eyes:  Without icterus, sclera clear and conjunctiva pink.  Ears:  Normal auditory acuity. Cardiovascular:  S1, S2 present without murmurs appreciated. Extremities without clubbing or edema. Respiratory:  Clear to auscultation bilaterally. No wheezes, rales, or rhonchi. No distress.  Gastrointestinal:  +BS, soft, non-tender and non-distended. No HSM noted. No guarding or rebound. No masses appreciated.  Rectal:  Deferred  Musculoskalatal:  Symmetrical without gross deformities. Skin:  Intact without significant lesions or rashes. Neurologic:  Alert and oriented x4;  grossly normal neurologically. Psych:  Alert and cooperative. Normal mood and affect. Heme/Lymph/Immune: No excessive bruising noted.    08/15/2019 10:31 AM   Disclaimer: This note was dictated with voice recognition software. Similar sounding words can inadvertently be transcribed and may not be corrected upon review.

## 2019-08-15 NOTE — Assessment & Plan Note (Signed)
Symptoms currently well managed on Amitiza 8 mcg twice daily.  She states about every couple months she will need to take a one-time laxative but otherwise Amitiza is effective.  Continue current medications and follow-up as needed.

## 2019-09-08 ENCOUNTER — Emergency Department (HOSPITAL_COMMUNITY): Payer: Medicare Other

## 2019-09-08 ENCOUNTER — Encounter (HOSPITAL_COMMUNITY): Payer: Self-pay | Admitting: Emergency Medicine

## 2019-09-08 ENCOUNTER — Other Ambulatory Visit: Payer: Self-pay

## 2019-09-08 ENCOUNTER — Emergency Department (HOSPITAL_COMMUNITY)
Admission: EM | Admit: 2019-09-08 | Discharge: 2019-09-08 | Disposition: A | Discharge status: Home / Self Care | Payer: Medicare Other | Attending: Emergency Medicine | Admitting: Emergency Medicine

## 2019-09-08 DIAGNOSIS — M545 Low back pain, unspecified: Secondary | ICD-10-CM

## 2019-09-08 DIAGNOSIS — Y999 Unspecified external cause status: Secondary | ICD-10-CM | POA: Insufficient documentation

## 2019-09-08 DIAGNOSIS — R0602 Shortness of breath: Secondary | ICD-10-CM | POA: Diagnosis present

## 2019-09-08 DIAGNOSIS — M542 Cervicalgia: Secondary | ICD-10-CM | POA: Insufficient documentation

## 2019-09-08 DIAGNOSIS — M79605 Pain in left leg: Secondary | ICD-10-CM | POA: Insufficient documentation

## 2019-09-08 DIAGNOSIS — I10 Essential (primary) hypertension: Secondary | ICD-10-CM | POA: Diagnosis not present

## 2019-09-08 DIAGNOSIS — T1490XA Injury, unspecified, initial encounter: Secondary | ICD-10-CM | POA: Diagnosis not present

## 2019-09-08 DIAGNOSIS — W208XXA Other cause of strike by thrown, projected or falling object, initial encounter: Secondary | ICD-10-CM | POA: Insufficient documentation

## 2019-09-08 DIAGNOSIS — M25521 Pain in right elbow: Secondary | ICD-10-CM | POA: Diagnosis not present

## 2019-09-08 DIAGNOSIS — Y92093 Driveway of other non-institutional residence as the place of occurrence of the external cause: Secondary | ICD-10-CM | POA: Diagnosis not present

## 2019-09-08 DIAGNOSIS — M25552 Pain in left hip: Secondary | ICD-10-CM | POA: Diagnosis not present

## 2019-09-08 DIAGNOSIS — Y9301 Activity, walking, marching and hiking: Secondary | ICD-10-CM | POA: Diagnosis not present

## 2019-09-08 DIAGNOSIS — M25551 Pain in right hip: Secondary | ICD-10-CM | POA: Diagnosis not present

## 2019-09-08 LAB — CBC
HCT: 37.5 % (ref 36.0–46.0)
Hemoglobin: 11.6 g/dL — ABNORMAL LOW (ref 12.0–15.0)
MCH: 29.2 pg (ref 26.0–34.0)
MCHC: 30.9 g/dL (ref 30.0–36.0)
MCV: 94.5 fL (ref 80.0–100.0)
Platelets: 373 10*3/uL (ref 150–400)
RBC: 3.97 MIL/uL (ref 3.87–5.11)
RDW: 15 % (ref 11.5–15.5)
WBC: 6.6 10*3/uL (ref 4.0–10.5)
nRBC: 0 % (ref 0.0–0.2)

## 2019-09-08 LAB — COMPREHENSIVE METABOLIC PANEL
ALT: 9 U/L (ref 0–44)
AST: 17 U/L (ref 15–41)
Albumin: 3.5 g/dL (ref 3.5–5.0)
Alkaline Phosphatase: 43 U/L (ref 38–126)
Anion gap: 6 (ref 5–15)
BUN: 10 mg/dL (ref 8–23)
CO2: 23 mmol/L (ref 22–32)
Calcium: 8.6 mg/dL — ABNORMAL LOW (ref 8.9–10.3)
Chloride: 110 mmol/L (ref 98–111)
Creatinine, Ser: 0.74 mg/dL (ref 0.44–1.00)
GFR calc Af Amer: >60 mL/min (ref >60)
GFR calc non Af Amer: >60 mL/min (ref >60)
Glucose, Bld: 86 mg/dL (ref 70–99)
Potassium: 3.9 mmol/L (ref 3.5–5.1)
Sodium: 139 mmol/L (ref 135–145)
Total Bilirubin: 0.7 mg/dL (ref 0.3–1.2)
Total Protein: 6.6 g/dL (ref 6.5–8.1)

## 2019-09-08 MED ORDER — FENTANYL CITRATE (PF) 100 MCG/2ML IJ SOLN: 50.0000 ug | Freq: Once | INTRAMUSCULAR | Status: DC

## 2019-09-08 MED ORDER — DIAZEPAM 5 MG PO TABS: 5.0000 mg | ORAL_TABLET | Freq: Two times a day (BID) | ORAL | 0 refills | Status: DC | PRN

## 2019-09-08 MED ORDER — FENTANYL CITRATE (PF) 100 MCG/2ML IJ SOLN
25.0000 ug | Freq: Once | INTRAMUSCULAR | Status: AC
  Administered 2019-09-08: 16:00:00 25 ug via INTRAVENOUS
  Filled 2019-09-08: qty 2

## 2019-09-08 NOTE — ED Triage Notes (Signed)
Pt was walking in driveway when a tree fell on pt. Pt denies LOC. Pt c/o back pain causing spasms. Denies LOC. Pt has small superficial laceration to RT forearm. AOx4. C-collar placed on pt by EMS.

## 2019-09-08 NOTE — ED Provider Notes (Signed)
[facility] Emergency Department Provider Note MRN:  [id number]  Arrival date & time: 09/08/19     Chief Complaint   Fall   History of Present Illness   Ellen Anderson is a 73 y.o. year-old female with a history of degenerative joint disease, hypertension presenting to the ED with chief complaint of fall.  Patient explains that she was walking in her driveway when she heard a loud crack.  She thought it was the sound of an airplane as it was very loud.  The next moment, she was struck very hard from behind and found herself underneath a large tree.  She thinks that 1 of the large branches of the full-grown tree struck her.  It took her a long time to crawl out from under the tree and it was very painful.  She is endorsing pain to her neck, back, hips, left leg, right elbow.  Pain is severe, constant, worse with motion.  Review of Systems  A complete 10 system review of systems was obtained and all systems are negative except as noted in the HPI and PMH.   Patient's Health History    Past Medical History:  Diagnosis Date  . Anemia   . Chronic low back pain   . Degenerative joint disease   . Hip fracture, left ([place]) 03/12/2012  . Hypertension   . PONV (postoperative nausea and vomiting)     Past Surgical History:  Procedure Laterality Date  . ABDOMINAL HYSTERECTOMY    . ANKLE SURGERY Left    pins and screws  . APPENDECTOMY    . BACK SURGERY    . CATARACT EXTRACTION W/PHACO Right 02/18/2015   Procedure: CATARACT EXTRACTION PHACO AND INTRAOCULAR LENS PLACEMENT RIGHT EYE; CDE:  10.48;  Surgeon: Tonny Branch, MD;  Location: [facility];  Service: Ophthalmology;  Laterality: Right;  . CATARACT EXTRACTION W/PHACO Left 03/07/2015   Procedure: CATARACT EXTRACTION PHACO AND INTRAOCULAR LENS PLACEMENT (IOC);  Surgeon: Tonny Branch, MD;  Location: [facility];  Service: Ophthalmology;  Laterality: Left;  CDE:7.14  . CHOLECYSTECTOMY    . COLONOSCOPY N/A 03/04/2016   Procedure:  COLONOSCOPY;  Surgeon: Danie Binder, MD;  [facility];  Service: Endoscopy;  Laterality: N/A;  930   . ESOPHAGOGASTRODUODENOSCOPY  07/23/2004   Dr. Rehman:foreign body impactedat cervical esophagus PORK CHOP, dilation of esophageal web  . ESOPHAGOGASTRODUODENOSCOPY  2011   Dr. Oneida Alar: food bolus which would not pass through gastric remnant into jejunostomy, PORK CHOP removed. AVOID PORK CHOPS  . ESOPHAGOGASTRODUODENOSCOPY N/A 03/04/2016   Procedure: ESOPHAGOGASTRODUODENOSCOPY (EGD);  Surgeon: Danie Binder, MD;  [facility];  Service: Endoscopy;  Laterality: N/A;  . ESOPHAGOGASTRODUODENOSCOPY (EGD) WITH PROPOFOL N/A 05/23/2019   Procedure: ESOPHAGOGASTRODUODENOSCOPY (EGD) WITH PROPOFOL;  Surgeon: Danie Binder, MD;  [facility];  Service: Endoscopy;  Laterality: N/A;  1:45pm  . GASTRIC BYPASS  1981  . HIP SURGERY Bilateral    fractured bilateral hips  . INTRAMEDULLARY (IM) NAIL INTERTROCHANTERIC Right 03/08/2013   Procedure: INTRAMEDULLARY (IM) NAIL INTERTROCHANTRIC;  Surgeon: Carole Civil, MD;  Location: [facility];  Service: Orthopedics;  Laterality: Right;  . SAVORY DILATION N/A 03/04/2016   Procedure: SAVORY DILATION;  Surgeon: Danie Binder, MD;  [facility];  Service: Endoscopy;  Laterality: N/A;  . SAVORY DILATION N/A 05/23/2019   Procedure: SAVORY DILATION;  Surgeon: Danie Binder, MD;  [facility];  Service: Endoscopy;  Laterality: N/A;  Family History  Problem Relation Age of Onset  . Throat cancer Mother   . COPD Father   . Stomach cancer Sister   . COPD Sister   . Colon polyps Sister   . COPD Sister   . Colon cancer Neg Hx   . Esophageal cancer Neg Hx     Social History   Socioeconomic History  . Marital status: Widowed    Spouse name: Not on file  . Number of children: Not on file  . Years of education: Not on file  . Highest education level: Not on file  Occupational History  . Not on file  Social  Needs  . Financial resource strain: Not on file  . Food insecurity    Worry: Not on file    Inability: Not on file  . Transportation needs    Medical: Not on file    Non-medical: Not on file  Tobacco Use  . Smoking status: Never Smoker  . Smokeless tobacco: Never Used  Substance and Sexual Activity  . Alcohol use: No    Alcohol/week: 0.0 standard drinks  . Drug use: No  . Sexual activity: Never    Birth control/protection: Surgical  Lifestyle  . Physical activity    Days per week: Not on file    Minutes per session: Not on file  . Stress: Not on file  Relationships  . Social Herbalist on phone: Not on file    Gets together: Not on file    Attends religious service: Not on file    Active member of club or organization: Not on file    Attends meetings of clubs or organizations: Not on file    Relationship status: Not on file  . Intimate partner violence    Fear of current or ex partner: Not on file    Emotionally abused: Not on file    Physically abused: Not on file    Forced sexual activity: Not on file  Other Topics Concern  . Not on file  Social History Narrative  . Not on file     Physical Exam  Vital Signs and Nursing Notes reviewed Vitals:   09/08/19 1525  BP: 108/71  Pulse: 83  Resp: 16  Temp: 98.7 F (37.1 C)  SpO2: 100%    CONSTITUTIONAL: Well-appearing, NAD NEURO:  Alert and oriented x 3, no focal deficits EYES:  eyes equal and reactive ENT/NECK:  no LAD, no JVD CARDIO: Regular rate, well-perfused, normal S1 and S2 PULM:  CTAB no wheezing or rhonchi GI/GU:  normal bowel sounds, non-distended, non-tender MSK/SPINE:  No gross deformities, no edema; tenderness palpation to the left foot, left ankle, left knee, bilateral hips, right elbow, midline tenderness to the C, T, L-spine. SKIN:  no rash, atraumatic PSYCH:  Appropriate speech and behavior  Diagnostic and Interventional Summary    EKG Interpretation  Date/Time:    Ventricular  Rate:    PR Interval:    QRS Duration:   QT Interval:    QTC Calculation:   R Axis:     Text Interpretation:        Labs Reviewed  CBC - Abnormal; Notable for the following components:      Result Value   Hemoglobin 11.6 (*)    All other components within normal limits  COMPREHENSIVE METABOLIC PANEL - Abnormal; Notable for the following components:   Calcium 8.6 (*)    All other components within normal limits    CT HEAD  WO CONTRAST  Final Result    CT CERVICAL SPINE WO CONTRAST  Final Result    CT Chest Wo Contrast  Final Result    CT ABDOMEN PELVIS WO CONTRAST  Final Result    DG Chest 1 View  Final Result    DG Pelvis 1-2 Views  Final Result    DG Foot Complete Left  Final Result    DG Ankle Complete Left  Final Result    DG Knee Complete 4 Views Left  Final Result    DG Elbow Complete Right  Final Result      Medications  fentaNYL (SUBLIMAZE) injection 25 mcg (25 mcg Intravenous Given 09/08/19 1606)     Procedures Critical Care  ED Course and Medical Decision Making  I have reviewed the triage vital signs and the nursing notes.  Pertinent labs & imaging results that were available during my care of the patient were reviewed by me and considered in my medical decision making (see below for details).  Concerning mechanism with fallen tree in this 73 year old female, imaging is pending.  Imaging reveals no acute fractures or emergencies.  There was low concern for significant intrathoracic and intra-abdominal injury, she has a significant allergy to contrast, noncontrast studies were utilized and this is felt to be sufficient for today's evaluation.  Patient's pain is better controlled, she is appropriate for discharge.  Barth Kirks. [place]onia Small, [place] [email or web address]  Final Clinical Impressions(s) / ED Diagnoses     ICD-10-CM   1. Accidentally struck by falling tree, initial encounter   W20.8XXA   2. SOB (shortness of breath)  R06.02 DG Chest 1 View    DG Chest 1 View    CANCELED: DG Chest Port 1 View    CANCELED: DG Chest Port 1 View  3. Injury  T14.90XA DG Pelvis 1-2 Views    DG Pelvis 1-2 Views  4. Acute midline low back pain without sciatica  M54.5     ED Discharge Orders         Ordered    diazepam (VALIUM) 5 MG tablet  Every 12 hours PRN     09/08/19 1833          Discharge Instructions Discussed with and Provided to Patient:   Discharge Instructions     You were evaluated in the Emergency Department and after careful evaluation, we did not find any emergent condition requiring admission or further testing in the hospital.  Your exam/testing today was overall reassuring.  Your CT scans and x-rays did not show any broken bones.  Please return to the Emergency Department if you experience any worsening of your condition.  We encourage you to follow up with a primary care provider.  Thank you for allowing [place] to be a part of your care.        Maudie Flakes, MD 09/08/19 [phone]

## 2019-09-08 NOTE — Discharge Instructions (Signed)
You were evaluated in the Emergency Department and after careful evaluation, we did not find any emergent condition requiring admission or further testing in the hospital.  Your exam/testing today was overall reassuring.  Your CT scans and x-rays did not show any broken bones.  Please return to the Emergency Department if you experience any worsening of your condition.  We encourage you to follow up with a primary care provider.  Thank you for allowing [place] to be a part of your care.

## 2019-09-18 ENCOUNTER — Telehealth: Payer: Self-pay

## 2019-09-18 NOTE — Telephone Encounter (Signed)
Pt left vm that she was having some problems swallowing. I called to discuss and she said she was seen in Sept by Walden Field, NP, and she has an appointment tomorrow. I told her NO, she is next due to come in in March, but how can we help her. She said I just want to come in and let Randall Hiss check and see if everything is alright with my throat. Then she said that she has a fever of 100.0 plus. I told her we cannot see her in the office with a fever. She said that she has problems sometime with swallowing and just wanted to let us know.  I told her to discuss her fever with PCP. If she cannot swallow then she should go to the ED. She said she will discuss with PCP and let me know if she has further problems with swallowing. Forwarding to Roseanne Kaufman, NP who is at the hospital today ( in [place] absence).

## 2019-09-18 NOTE — Telephone Encounter (Signed)
I called pt and she says this time that her throat is swollen and she can't hardly get liquids down. Still running a fever a little over 100.0. I told her she will need to go to the Ed if she cannot get liquids down. She said that she will go.

## 2019-09-18 NOTE — Telephone Encounter (Signed)
Have her stick with soft foods going forward. Sounds like dysphagia is a chronic issue. Fever likely unrelated to any GI process. Please have an appt for her in next week or 2, further recommendations per Randall Hiss.

## 2019-09-18 NOTE — Telephone Encounter (Signed)
Agree 

## 2019-09-19 NOTE — Telephone Encounter (Signed)
Noted, agree. Thanks for the coverage. Will see her based on recommendations from the ED

## 2019-10-10 ENCOUNTER — Other Ambulatory Visit: Payer: Self-pay | Admitting: Gastroenterology

## 2019-11-07 ENCOUNTER — Other Ambulatory Visit: Payer: Self-pay | Admitting: Gastroenterology

## 2020-02-13 ENCOUNTER — Ambulatory Visit (INDEPENDENT_AMBULATORY_CARE_PROVIDER_SITE_OTHER): Payer: Medicare Other | Admitting: Nurse Practitioner

## 2020-02-13 ENCOUNTER — Encounter: Payer: Self-pay | Admitting: Nurse Practitioner

## 2020-02-13 ENCOUNTER — Other Ambulatory Visit: Payer: Self-pay

## 2020-02-13 ENCOUNTER — Encounter: Payer: Self-pay | Admitting: *Deleted

## 2020-02-13 VITALS — BP 121/80 | HR 98 | Temp 96.9°F | Ht 63.0 in | Wt 146.2 lb

## 2020-02-13 DIAGNOSIS — R1319 Other dysphagia: Secondary | ICD-10-CM

## 2020-02-13 DIAGNOSIS — K5900 Constipation, unspecified: Secondary | ICD-10-CM

## 2020-02-13 DIAGNOSIS — R1310 Dysphagia, unspecified: Secondary | ICD-10-CM

## 2020-02-13 NOTE — Progress Notes (Signed)
Referring Provider: Jani Gravel, MD Primary Care Physician:  Jani Gravel, MD Primary GI:  Dr. Oneida Alar  Chief Complaint  Patient presents with  . Dysphagia    food/pills    HPI:   Ellen Anderson is a 73 y.o. female who presents for follow-up on dysphagia.  She was last seen in our office 08/15/2019 for dysphagia and constipation.  Noted history of gastric bypass in the remote past, colonoscopy up-to-date.  Noted history of dysphagia symptoms her whole life with multiple dilations.  Still on omeprazole daily without overt GERD.  She noted a particularly bad episode of dysphagia that lasted 15 minutes and attempted fluids regurgitated up her nasal passages.  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  Recommended strictly adhere to gastric bypass diet, continue current medications.  At her last visit she noted dysphagia better with adequate chewing/eating time.  Occasional/rare pill dysphagia but thinks this is related to enlarged tonsils.  No GERD symptoms.  Chronic constipation generally well managed with Amitiza 8 mcg twice daily with "rescue laxative" about every 2 months.  No other overt GI complaints.  Recommended continue chewing/swallowing precautions, continue current medications, follow-up in 6 months.  Today she states she's doing ok. Persistent dysphagia symptoms which has been "bad and getting worse." This began worsening a couple months ago. Feels like a pill always gets stuck on the left side (not sure if it's her tonsil?). Worsening with foods, sometimes now having issues with mashed potatoes. Usually passes with time, occasional regurgitation. Constipation getting worse; only on pain medication about 2-3 times a week. On Amitiza 8 mcg bid and Colace once daily. Stools like small, hard pieces (previously consistent with [place] 4). Not following her gastric bypass diet. Doesn't drink any water (never have), minimal fruits/veggies.  Has to use a laxative or suppository about every 10 days. Denies abdominal pain, N/V, hematochezia, melena, fever, chills, unintentional weight loss. Denies URI or flu-like symptoms. Denies loss of sense of taste or smell.  Has been tested for COVID-19 x 2 (last in January) which were negative. Denies chest pain, dyspnea, dizziness, lightheadedness, syncope, near syncope. Denies any other upper or lower GI symptoms.  Past Medical History:  Diagnosis Date  . Anemia   . Chronic low back pain   . Degenerative joint disease   . Hip fracture, left ([place]) 03/12/2012  . Hypertension   . PONV (postoperative nausea and vomiting)     Past Surgical History:  Procedure Laterality Date  . ABDOMINAL HYSTERECTOMY    . ANKLE SURGERY Left    pins and screws  . APPENDECTOMY    . BACK SURGERY    . CATARACT EXTRACTION W/PHACO Right 02/18/2015   Procedure: CATARACT EXTRACTION PHACO AND INTRAOCULAR LENS PLACEMENT RIGHT EYE; CDE:  10.48;  Surgeon: Tonny Branch, MD;  Location: [facility];  Service: Ophthalmology;  Laterality: Right;  . CATARACT EXTRACTION W/PHACO Left 03/07/2015   Procedure: CATARACT EXTRACTION PHACO AND INTRAOCULAR LENS PLACEMENT (IOC);  Surgeon: Tonny Branch, MD;  Location: [facility];  Service: Ophthalmology;  Laterality: Left;  CDE:7.14  . CHOLECYSTECTOMY    . COLONOSCOPY N/A 03/04/2016   Procedure: COLONOSCOPY;  Surgeon: Danie Binder, MD;  Location: [facility];  Service: Endoscopy;  Laterality: N/A;  930   . ESOPHAGOGASTRODUODENOSCOPY  07/23/2004   Dr. Rehman:foreign body impactedat cervical esophagus PORK CHOP, dilation of esophageal web  . ESOPHAGOGASTRODUODENOSCOPY  2011   Dr. Oneida Alar: food bolus which  would not pass through gastric remnant into jejunostomy, PORK CHOP removed. AVOID PORK CHOPS  . ESOPHAGOGASTRODUODENOSCOPY N/A 03/04/2016   Procedure: ESOPHAGOGASTRODUODENOSCOPY (EGD);  Surgeon: Danie Binder, MD;  Location: [facility];  Service: Endoscopy;  Laterality: N/A;  .  ESOPHAGOGASTRODUODENOSCOPY (EGD) WITH PROPOFOL N/A 05/23/2019   Procedure: ESOPHAGOGASTRODUODENOSCOPY (EGD) WITH PROPOFOL;  Surgeon: Danie Binder, MD;  Location: [facility];  Service: Endoscopy;  Laterality: N/A;  1:45pm  . GASTRIC BYPASS  1981  . HIP SURGERY Bilateral    fractured bilateral hips  . INTRAMEDULLARY (IM) NAIL INTERTROCHANTERIC Right 03/08/2013   Procedure: INTRAMEDULLARY (IM) NAIL INTERTROCHANTRIC;  Surgeon: Carole Civil, MD;  Location: [facility];  Service: Orthopedics;  Laterality: Right;  . SAVORY DILATION N/A 03/04/2016   Procedure: SAVORY DILATION;  Surgeon: Danie Binder, MD;  Location: [facility];  Service: Endoscopy;  Laterality: N/A;  . SAVORY DILATION N/A 05/23/2019   Procedure: SAVORY DILATION;  Surgeon: Danie Binder, MD;  Location: [facility];  Service: Endoscopy;  Laterality: N/A;    Current Outpatient Medications  Medication Sig Dispense Refill  . AMITIZA 8 MCG capsule TAKE 1 CAPSULE BY MOUTH TWICE DAILY WITH FOOD (Patient taking differently: Take 8 mcg by mouth 2 (two) times daily with a meal. ) 60 capsule 5  . aspirin 81 MG EC tablet Take 81 mg by mouth daily.     Marland Kitchen denosumab (PROLIA) 60 MG/ML SOLN injection Inject 60 mg into the skin every 6 (six) months. Administer in upper arm, thigh, or abdomen    . diazepam (VALIUM) 10 MG tablet Take 1 tablet (10 mg total) by mouth every 8 (eight) hours as needed. For muscle spasms 30 tablet 0  . diphenhydrAMINE (BENADRYL) 25 mg capsule Take 75 mg by mouth every 6 (six) hours as needed for itching. Takes 3 before and 3 after taking taking Oxycodone     . ergocalciferol (VITAMIN D2) 1.25 MG (50000 UT) capsule Take 50,000 Units by mouth once a week. fridays    . magnesium oxide (MAG-OX) 400 MG tablet Take 400 mg by mouth daily.    Marland Kitchen omeprazole (PRILOSEC) 20 MG capsule TAKE 1 CAPSULE BY MOUTH DAILY BEFORE BREAKFAST 90 capsule 3  . oxyCODONE (OXY IR/ROXICODONE) 5 MG immediate release tablet Take 1 tablet by mouth  every 6 (six) hours as needed (pain).     . promethazine (PHENERGAN) 12.5 MG tablet Take 1 tablet (12.5 mg total) by mouth every 6 (six) hours as needed for nausea or vomiting. 30 tablet 0  . Propylene Glycol (SYSTANE BALANCE) 0.6 % SOLN Place 1 drop into both eyes daily as needed (dry eyes).     No current facility-administered medications for this visit.    Allergies as of 02/13/2020 - Review Complete 02/13/2020  Allergen Reaction Noted  . Amoxicillin-pot clavulanate Swelling 12/25/2011  . Ceclor [cefaclor] Swelling 12/25/2011  . Morphine and related Other (See Comments) 12/25/2011  . Sulfur Shortness Of Breath 12/25/2011  . Azithromycin Other (See Comments) 12/26/2011  . Flexeril [cyclobenzaprine] Hives and Swelling 02/20/2016  . Ivp dye [iodinated diagnostic agents] Other (See Comments) 12/25/2011  . Methadone Other (See Comments) 12/25/2011  . Percocet [oxycodone-acetaminophen] Itching 12/25/2011  . Betadine [povidone iodine] Rash 02/18/2015  . Codeine Hives and Rash 12/25/2011  . Escitalopram oxalate Other (See Comments) 12/25/2011  . Ibuprofen Hives and Rash 12/26/2011  . Tylenol [acetaminophen] Hives and Rash 12/26/2011    Family History  Problem Relation Age of Onset  . Throat  cancer Mother   . COPD Father   . Stomach cancer Sister   . COPD Sister   . Colon polyps Sister   . COPD Sister   . Colon cancer Neg Hx   . Esophageal cancer Neg Hx     Social History   Socioeconomic History  . Marital status: Widowed    Spouse name: Not on file  . Number of children: Not on file  . Years of education: Not on file  . Highest education level: Not on file  Occupational History  . Not on file  Tobacco Use  . Smoking status: Never Smoker  . Smokeless tobacco: Never Used  Substance and Sexual Activity  . Alcohol use: No    Alcohol/week: 0.0 standard drinks  . Drug use: No  . Sexual activity: Never    Birth control/protection: Surgical  Other Topics Concern  . Not on  file  Social History Narrative  . Not on file   Social Determinants of Health   Financial Resource Strain:   . Difficulty of Paying Living Expenses: Not on file  Food Insecurity:   . Worried About Charity fundraiser in the Last Year: Not on file  . Ran Out of Food in the Last Year: Not on file  Transportation Needs:   . Lack of Transportation (Medical): Not on file  . Lack of Transportation (Non-Medical): Not on file  Physical Activity:   . Days of Exercise per Week: Not on file  . Minutes of Exercise per Session: Not on file  Stress:   . Feeling of Stress : Not on file  Social Connections:   . Frequency of Communication with Friends and Family: Not on file  . Frequency of Social Gatherings with Friends and Family: Not on file  . Attends Religious Services: Not on file  . Active Member of Clubs or Organizations: Not on file  . Attends Archivist Meetings: Not on file  . Marital Status: Not on file    Review of Systems: General: Negative for anorexia, weight loss, fever, chills, fatigue, weakness. Eyes: Negative for vision changes.  ENT: Negative for hoarseness, difficulty swallowing , nasal congestion. CV: Negative for chest pain, angina, palpitations, dyspnea on exertion, peripheral edema.  Respiratory: Negative for dyspnea at rest, dyspnea on exertion, cough, sputum, wheezing.  GI: See history of present illness. GU:  Negative for dysuria, hematuria, urinary incontinence, urinary frequency, nocturnal urination.  MS: Negative for joint pain, low back pain.  Derm: Negative for rash or itching.  Neuro: Negative for weakness, abnormal sensation, seizure, frequent headaches, memory loss, confusion.  Psych: Negative for anxiety, depression, suicidal ideation, hallucinations.  Endo: Negative for unusual weight change.  Heme: Negative for bruising or bleeding. Allergy: Negative for rash or hives.   Physical Exam: BP 121/80   Pulse 98   Temp (!) 96.9 F (36.1 C)  (Temporal)   Ht 5\' 3"  (1.6 m)   Wt 146 lb 3.2 oz (66.3 kg)   BMI 25.90 kg/m  General:   Alert and oriented. Pleasant and cooperative. Well-nourished and well-developed.  Head:  Normocephalic and atraumatic. Eyes:  Without icterus, sclera clear and conjunctiva pink.  Ears:  Normal auditory acuity. Mouth:  No deformity or lesions, oral mucosa pink.  Throat/Neck:  Supple, without mass or thyromegaly. Cardiovascular:  S1, S2 present without murmurs appreciated. Normal pulses noted. Extremities without clubbing or edema. Respiratory:  Clear to auscultation bilaterally. No wheezes, rales, or rhonchi. No distress.  Gastrointestinal:  +BS,  soft, non-tender and non-distended. No HSM noted. No guarding or rebound. No masses appreciated.  Rectal:  Deferred  Musculoskalatal:  Symmetrical without gross deformities. Normal posture. Skin:  Intact without significant lesions or rashes. Neurologic:  Alert and oriented x4;  grossly normal neurologically. Psych:  Alert and cooperative. Normal mood and affect. Heme/Lymph/Immune: No significant cervical adenopathy. No excessive bruising noted.    02/13/2020 11:03 AM   Disclaimer: This note was dictated with voice recognition software. Similar sounding words can inadvertently be transcribed and may not be corrected upon review.

## 2020-02-13 NOTE — Assessment & Plan Note (Signed)
Constipation previously well managed on Amitiza 8 mcg.  She is not having as good a response.  Previously tried and failed Linzess.  At this point, because we do not have samples of Amitiza 24 mcg, I will have her triple up on her Amitiza dose for 1 to 2 weeks and let us know if any improvement.  If not we can consider regimen change such as Movantik, Trulance, Motegrity, Symproic, etc.  Follow-up in 2 months.

## 2020-02-13 NOTE — Patient Instructions (Signed)
Your health issues we discussed today were:   Constipation: 1. As we discussed, triple up your Amitiza dose.  Take 24 mcg of Amitiza (three 8 mcg pills) twice a day 2. Call us in 1 to 2 weeks and let us know if this is helping.  If so, we can send a new prescription to your pharmacy.  If not, we can try different medication 3. Call us for any worsening or severe symptoms  Dysphagia (difficulty swallowing): 1. Because we just recently completed your procedure, I will have an x-ray test (BPE) done to evaluate for any recurrent narrowing 2. Further recommendations will be made after your BPE 3. In the meantime, because of the sensation of things getting "stuck on your tonsils" I will refer you to an ENT specialist to see if there is any involvement with the back of the throat 4. Call us if you have any worsening or severe symptoms 5. If food gets stuck and will go forward, will not go backward and this lasts for 2 or more hours go to the emergency room  Overall I recommend:  1. Continue your other current medications 2. Return for follow-up in 2 months 3. Call us if you have any questions or concerns   ---------------------------------------------------------------  COVID-19 Vaccine Information can be found at: [email or web address].uk For questions related to vaccine distribution or appointments, please email [email or web address] or call [phone].   ---------------------------------------------------------------   At [facility] we value your feedback. You may receive a survey about your visit today. Please share your experience as we strive to create trusting relationships with our patients to provide genuine, compassionate, quality care.  We appreciate your understanding and patience as we review any laboratory studies, imaging, and other diagnostic tests that are ordered as we care for you. Our office policy is 5  business days for review of these results, and any emergent or urgent results are addressed in a timely manner for your best interest. If you do not hear from our office in 1 week, please contact us.   We also encourage the use of MyChart, which contains your medical information for your review as well. If you are not enrolled in this feature, an access code is on this after visit summary for your convenience. Thank you for allowing [place] to be involved in your care.  It was great to see you today!  I hope you have a great day!!

## 2020-02-13 NOTE — Assessment & Plan Note (Signed)
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  Her last EGD was in June 2020 with a dilation.  Her symptoms began worsening again a couple months ago.  At this point, given her recent EGD, I will check a barium pill esophagram to evaluate for any need of repeat procedure.  She has at previous visits, and today, mentioned that sometimes it feels like food or pills get stuck "on my tonsil".  Query possible ENT involvement.  I will refer her to an ENT specialist to evaluate for any component of the oropharynx possibly mimicking dysphagia.  Follow-up in 2 months.

## 2020-02-20 ENCOUNTER — Ambulatory Visit (HOSPITAL_COMMUNITY): Payer: Medicare Other

## 2020-02-26 ENCOUNTER — Ambulatory Visit (HOSPITAL_COMMUNITY)
Admission: RE | Admit: 2020-02-26 | Discharge: 2020-02-26 | Disposition: A | Discharge status: Home / Self Care | Payer: Medicare Other | Source: Ambulatory Visit | Attending: Nurse Practitioner | Admitting: Nurse Practitioner

## 2020-02-26 ENCOUNTER — Other Ambulatory Visit: Payer: Self-pay

## 2020-02-26 ENCOUNTER — Other Ambulatory Visit: Payer: Self-pay | Admitting: Nurse Practitioner

## 2020-02-26 DIAGNOSIS — R1310 Dysphagia, unspecified: Secondary | ICD-10-CM | POA: Diagnosis present

## 2020-02-26 DIAGNOSIS — K5900 Constipation, unspecified: Secondary | ICD-10-CM | POA: Diagnosis not present

## 2020-02-26 DIAGNOSIS — R1319 Other dysphagia: Secondary | ICD-10-CM

## 2020-02-28 ENCOUNTER — Telehealth: Payer: Self-pay | Admitting: Gastroenterology

## 2020-02-28 NOTE — Telephone Encounter (Signed)
Patient called back and decided she is ready to schedule EGD +/-dil. Patient scheduled with RMR 6/14 at 2:30pm. Aware will mail prep instructions with pre-op/covid test. Confirmed mailing address. Pt also wants to know if she should keep her May appt with EG?

## 2020-02-28 NOTE — Telephone Encounter (Signed)
Pt was seen in office on 3/9 and called today asking to get scheduled her colonoscopy. Please call [phone]

## 2020-02-29 ENCOUNTER — Encounter: Payer: Self-pay | Admitting: *Deleted

## 2020-03-01 NOTE — Telephone Encounter (Signed)
If she's doing ok and no significant problems we can reschedule her visit for sometime after her EGD. Call if any problems or worsening before then.

## 2020-03-04 NOTE — Telephone Encounter (Signed)
Called pt made aware. appt has been r/s'd with EG to 6/30 at 9:30am.

## 2020-04-10 ENCOUNTER — Ambulatory Visit: Payer: Medicare Other | Admitting: Gastroenterology

## 2020-04-11 ENCOUNTER — Ambulatory Visit: Payer: Medicare Other | Admitting: Orthopedic Surgery

## 2020-04-13 IMAGING — CT CT HEAD W/O CM
5 of 7 series · 18 of 47 positions shown, 19 images · non-contrast
Comparison: None.

CLINICAL DATA: Trauma, hit by tree

EXAM:
CT HEAD WITHOUT CONTRAST
CT CERVICAL SPINE WITHOUT CONTRAST
TECHNIQUE: Multidetector CT imaging of the head and cervical spine was
performed following the standard protocol without intravenous
contrast. Multiplanar CT image reconstructions of the cervical spine
were also generated.

[Series 3: head w o · axial · 0.46mm/px · z∈[+54,+104]mm · 2 of 31 slices shown, 3 images]
[im 11/31  brain]
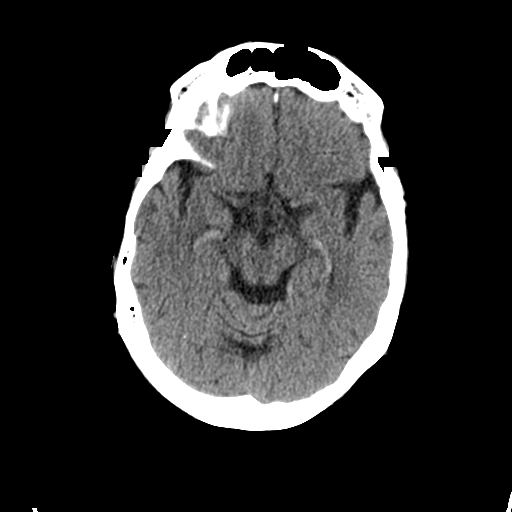
[im 11/31  bone]
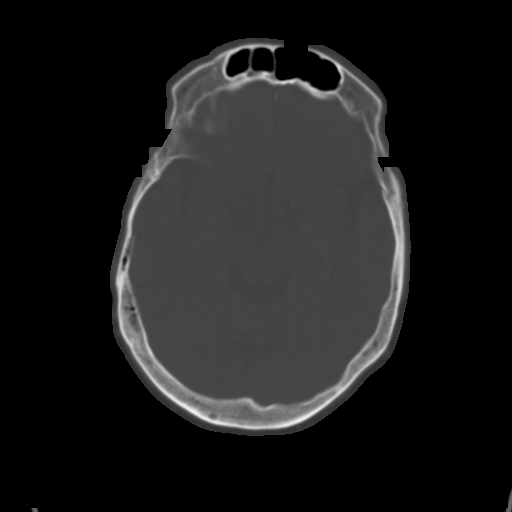
[im 21/31  brain]
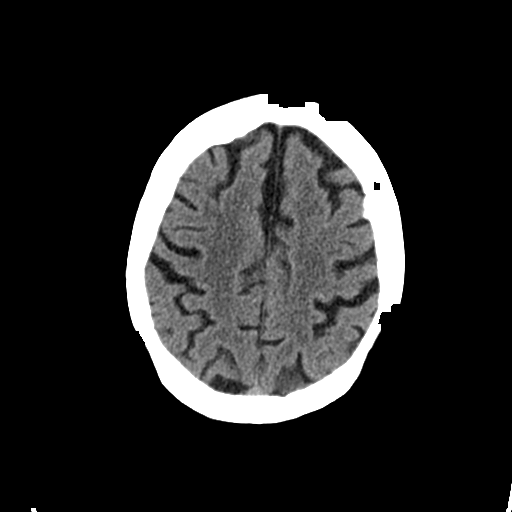

[Series 4: head bone · axial · 0.46mm/px · z∈[+18,+144]mm · 8 of 79 slices shown]
[im 8/79  bone]
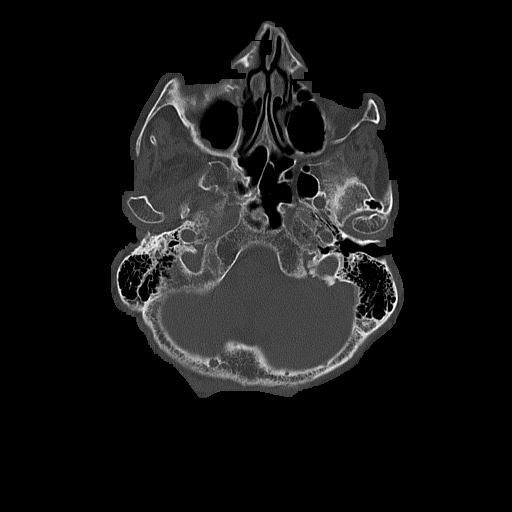
[im 15/79  bone]
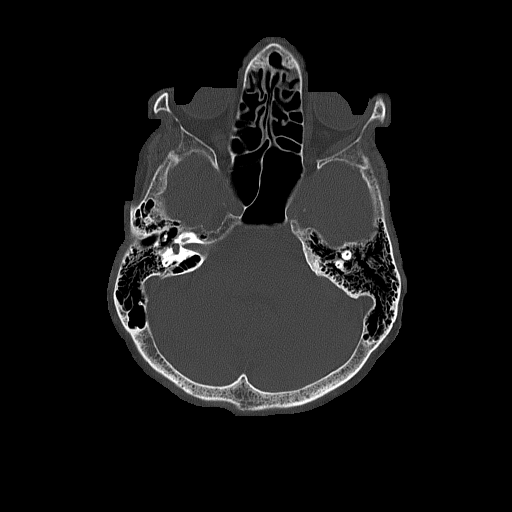
[im 29/79  bone]
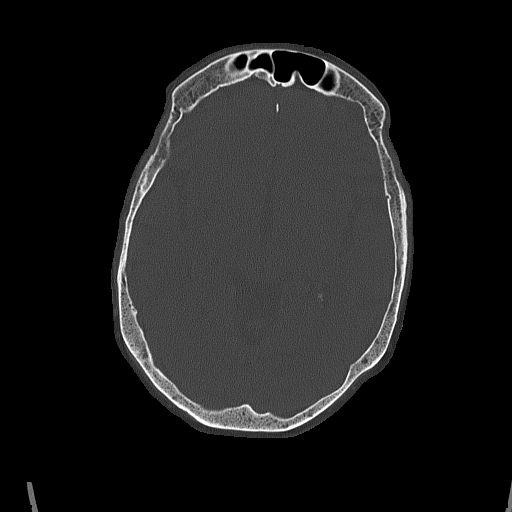
[im 36/79  bone]
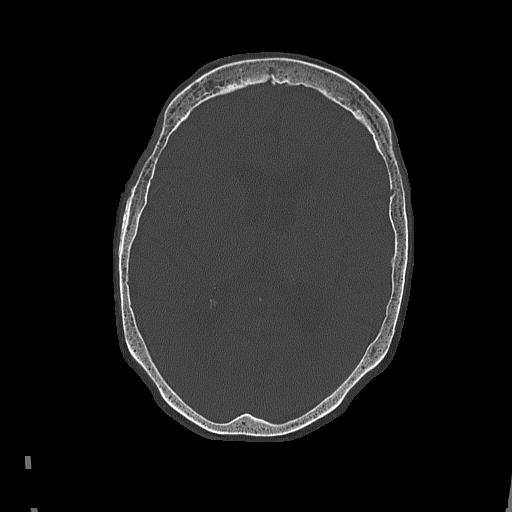
[im 43/79  bone]
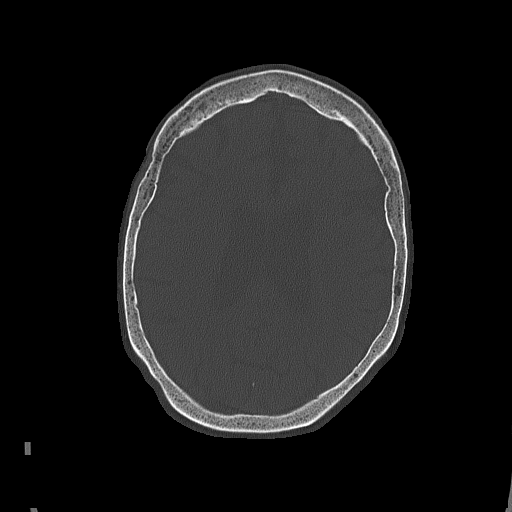
[im 50/79  bone]
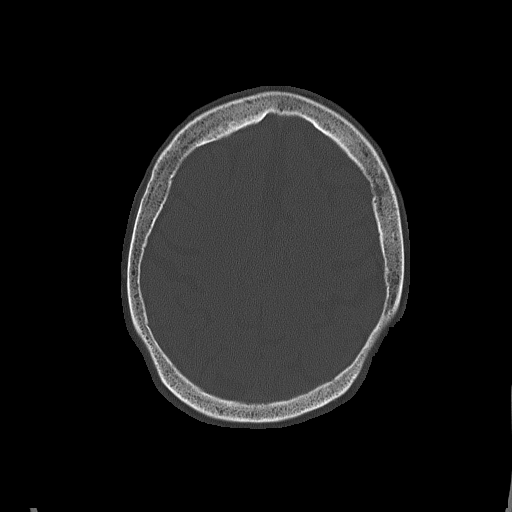
[im 64/79  bone]
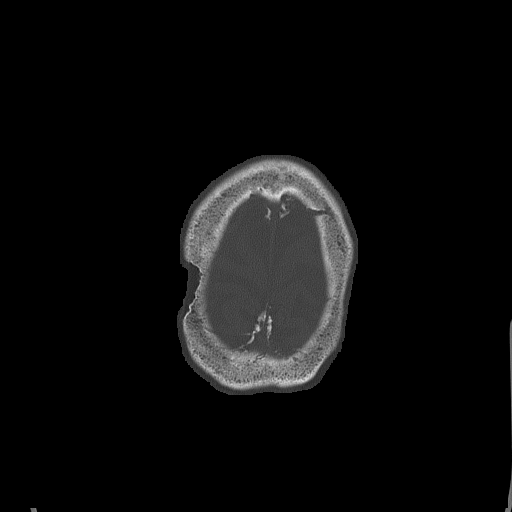
[im 71/79  bone]
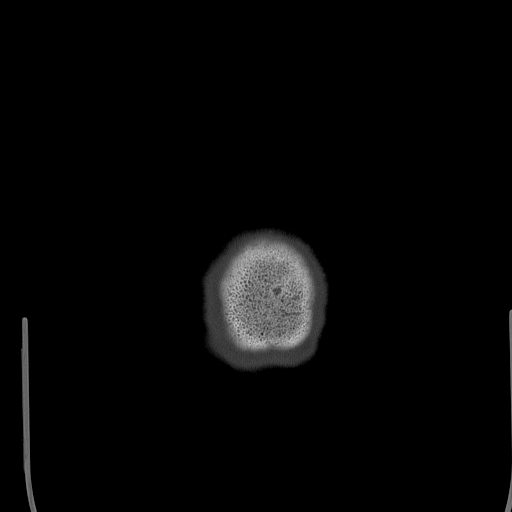

[Series 5: coronal soft · coronal · 0.32mm/px · 3 of 64 slices shown]
[im 24/64  brain]
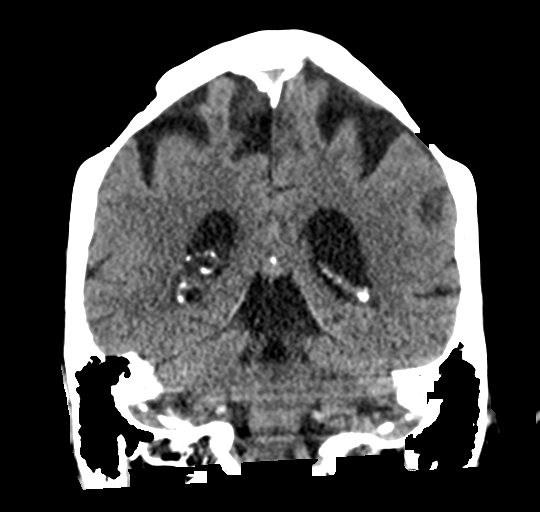
[im 32/64  brain]
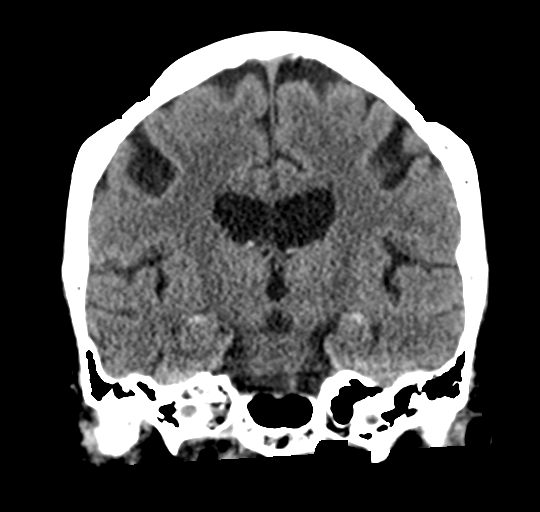
[im 40/64  brain]
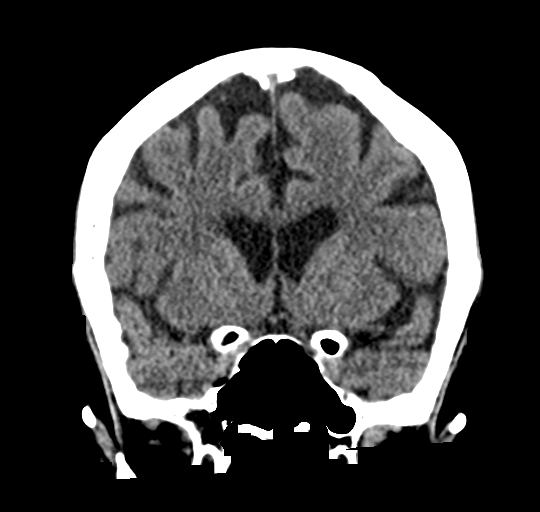

[Series 6: sagittal soft · sagittal · 0.33mm/px · 1 of 50 slices shown]
[im 25/50  brain]
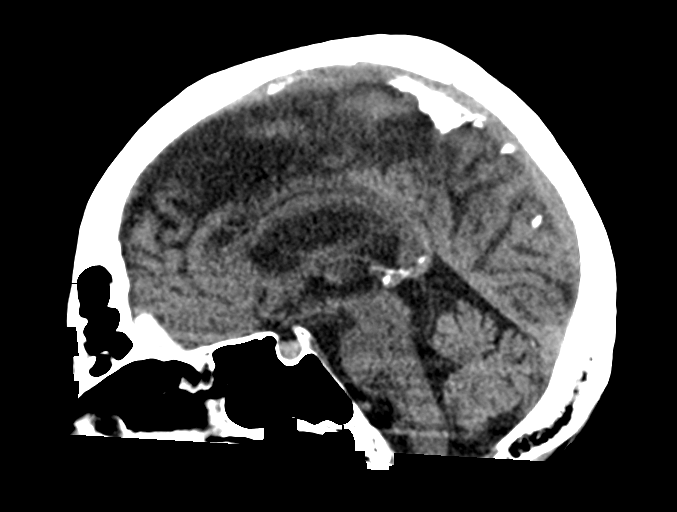

[Series 8: c spine soft · axial · 0.37mm/px · z∈[-152,-82]mm · 4 of 93 slices shown]
[im 8/93  brain]
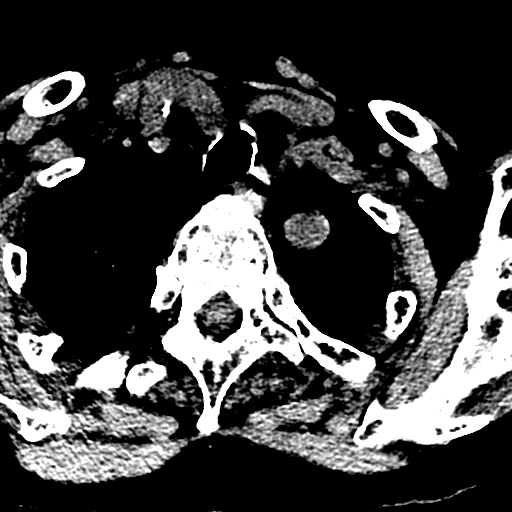
[im 22/93  brain]
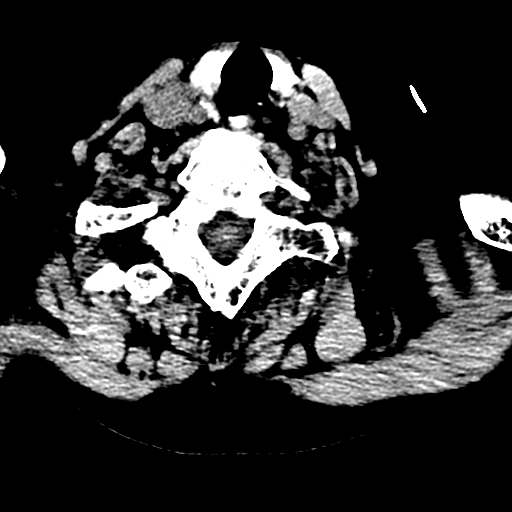
[im 29/93  brain]
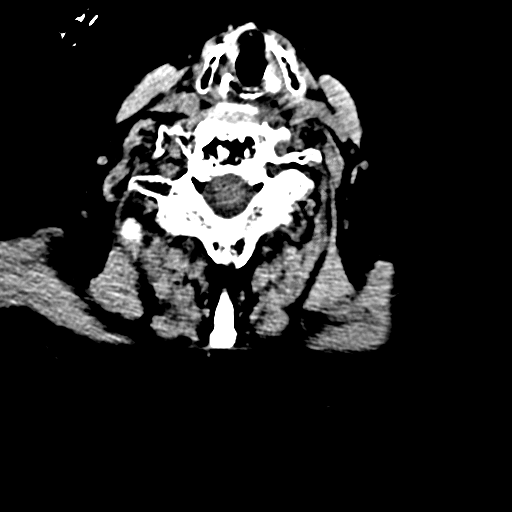
[im 43/93  brain]
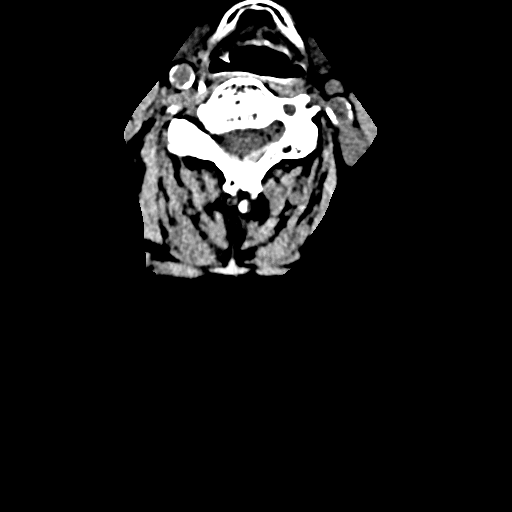

[18 of 47 positions shown; findings below may reference images not displayed]

FINDINGS: CT HEAD FINDINGS

Brain: No evidence of acute infarction, hemorrhage, hydrocephalus,
extra-axial collection or mass lesion/mass effect.

Vascular: No hyperdense vessel or unexpected calcification.

Skull: Normal. Negative for fracture or focal lesion.

Sinuses/Orbits: No acute finding.

Other: None.

CT CERVICAL SPINE FINDINGS

Alignment: Normal.

Skull base and vertebrae: No acute fracture. No primary bone lesion
or focal pathologic process. Benign, trabeculated vertebral body
hemangioma of C6.

Soft tissues and spinal canal: No prevertebral fluid or swelling. No
visible canal hematoma.

Disc levels: Mild multilevel disc space height loss and
osteophytosis.

Upper chest: Negative.

Other: None.
IMPRESSION: 1.  No acute intracranial pathology.

2.  No fracture or static subluxation of the cervical spine.

## 2020-04-13 IMAGING — CT CT CERVICAL SPINE W/O CM
3 series · 13 of 35 positions shown, 16 images · non-contrast
Comparison: None.

CLINICAL DATA: Trauma, hit by tree

EXAM:
CT HEAD WITHOUT CONTRAST
CT CERVICAL SPINE WITHOUT CONTRAST
TECHNIQUE: Multidetector CT imaging of the head and cervical spine was
performed following the standard protocol without intravenous
contrast. Multiplanar CT image reconstructions of the cervical spine
were also generated.

[Series 6: sagittal soft · sagittal · 0.33mm/px · 5 of 50 slices shown, 6 images]
[im 17/50  bone]
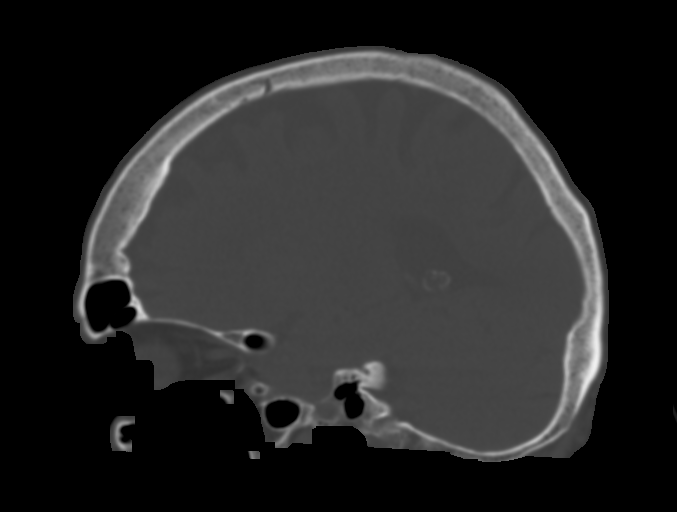
[im 21/50  bone]
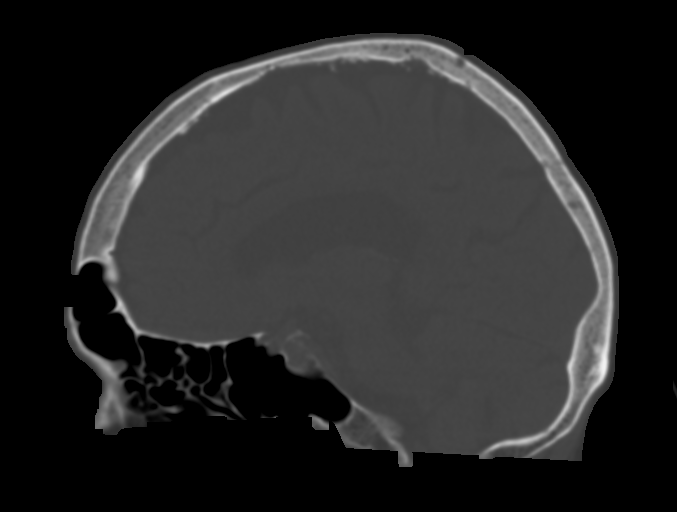
[im 25/50  soft-tissue]
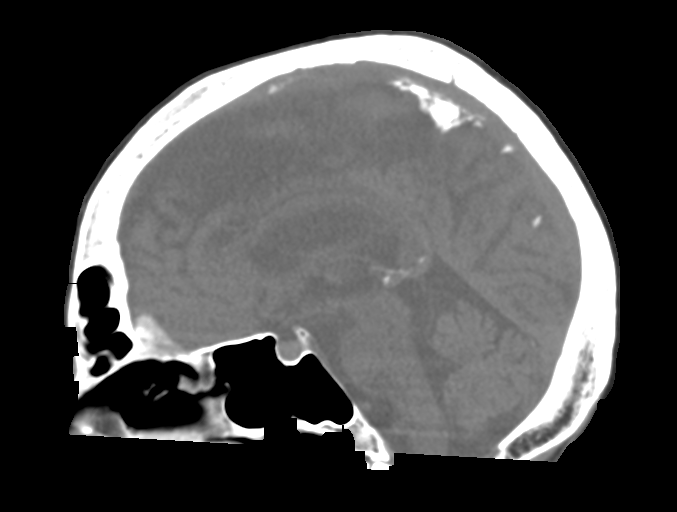
[im 25/50  bone]
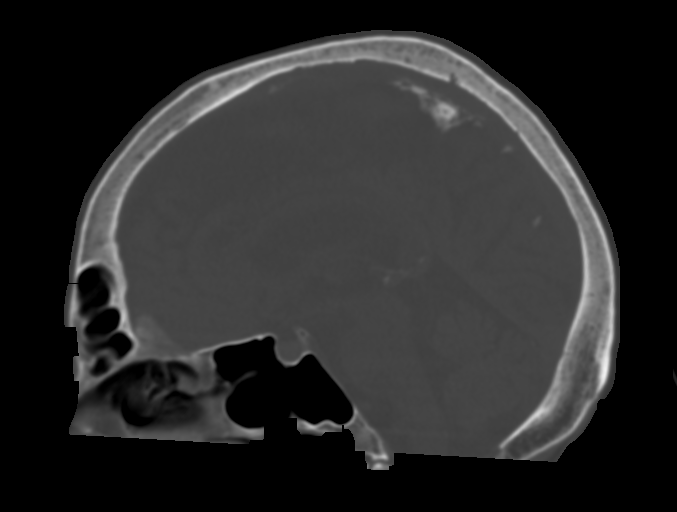
[im 29/50  bone]
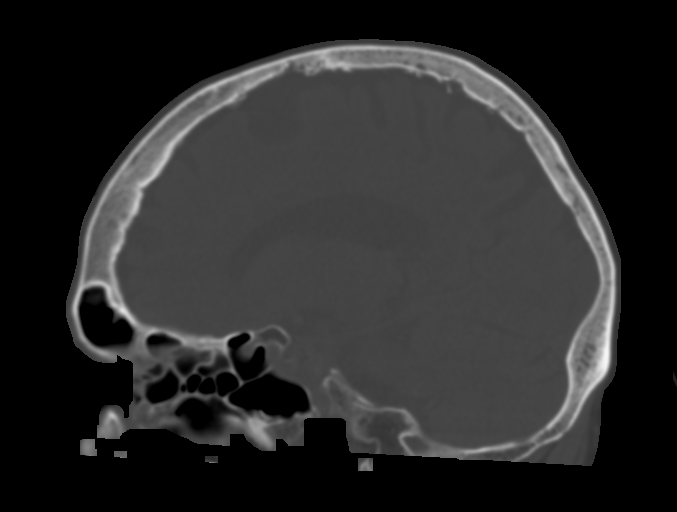
[im 33/50  bone]
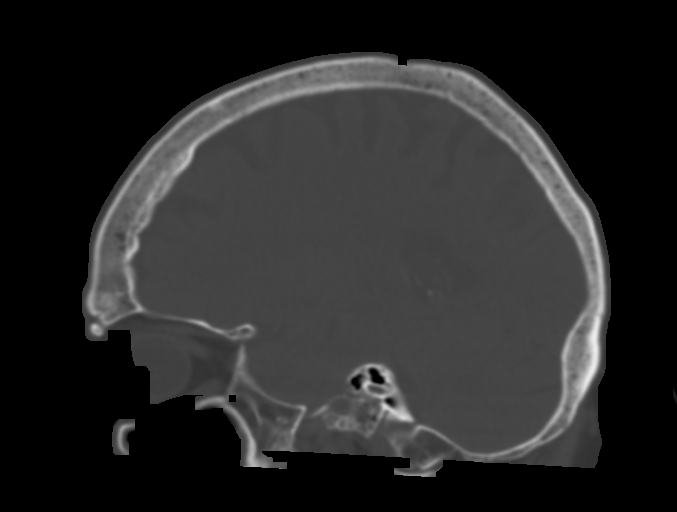

[Series 7: c spine bone · axial · 0.37mm/px · z∈[-138,-12]mm · 5 of 93 slices shown, 7 images]
[im 15/93  soft-tissue]
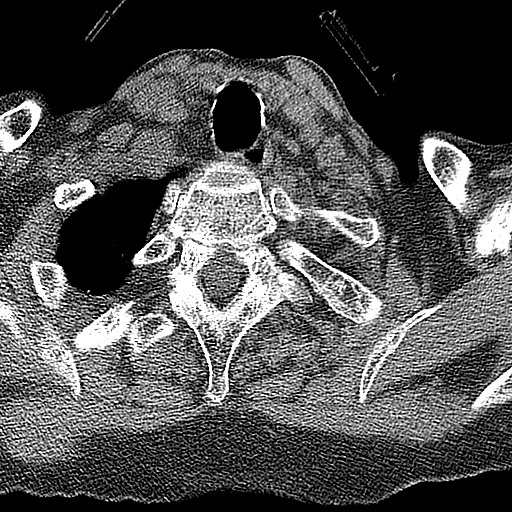
[im 15/93  bone]
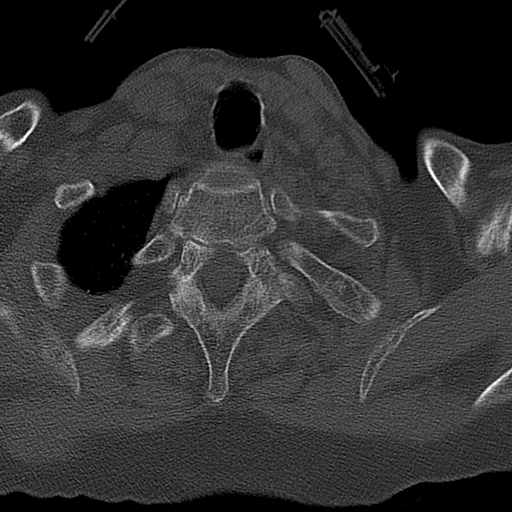
[im 29/93  bone]
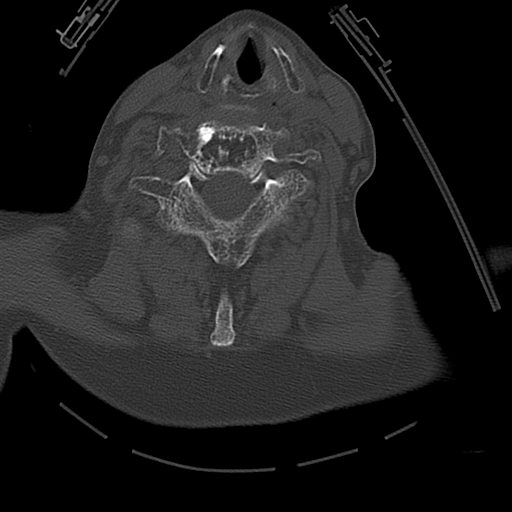
[im 50/93  bone]
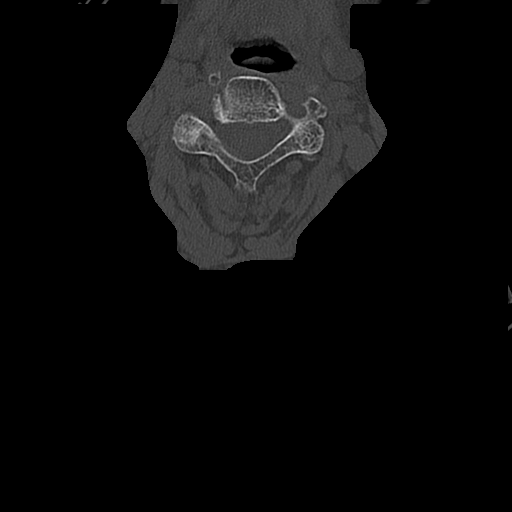
[im 64/93  bone]
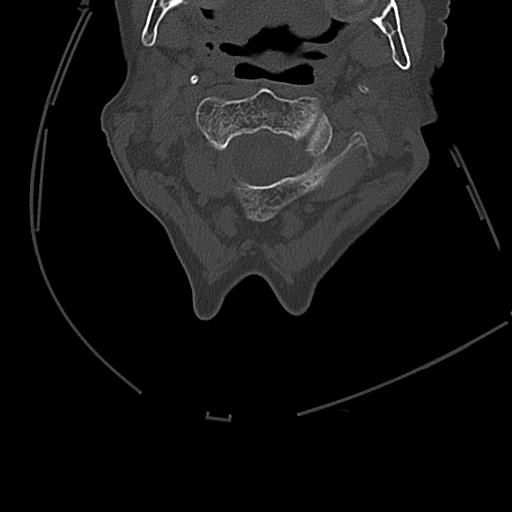
[im 78/93  soft-tissue]
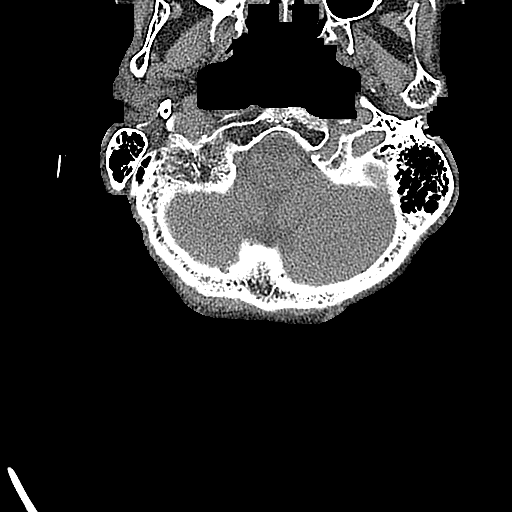
[im 78/93  bone]
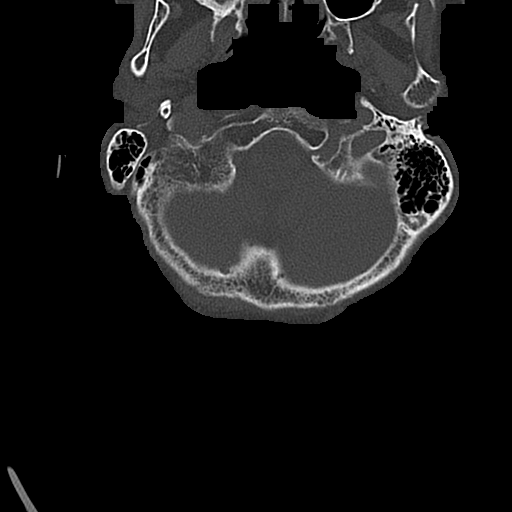

[Series 10: coronal bone · coronal · 0.27mm/px · 3 of 67 slices shown]
[im 14/67  bone]
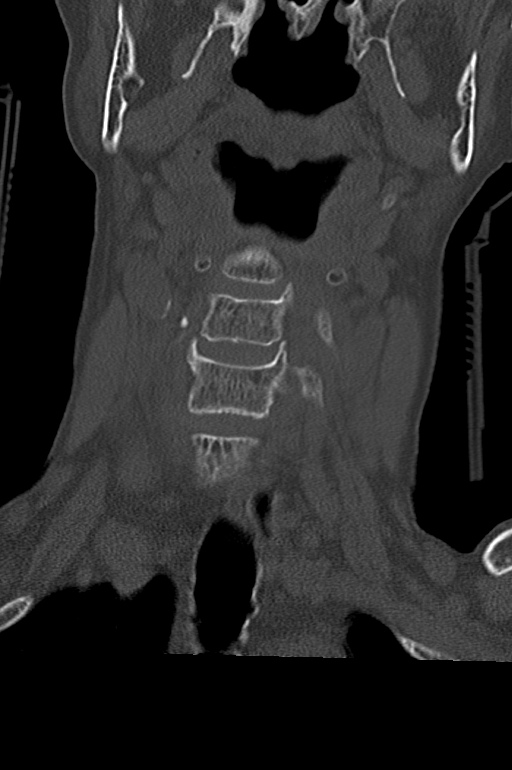
[im 27/67  bone]
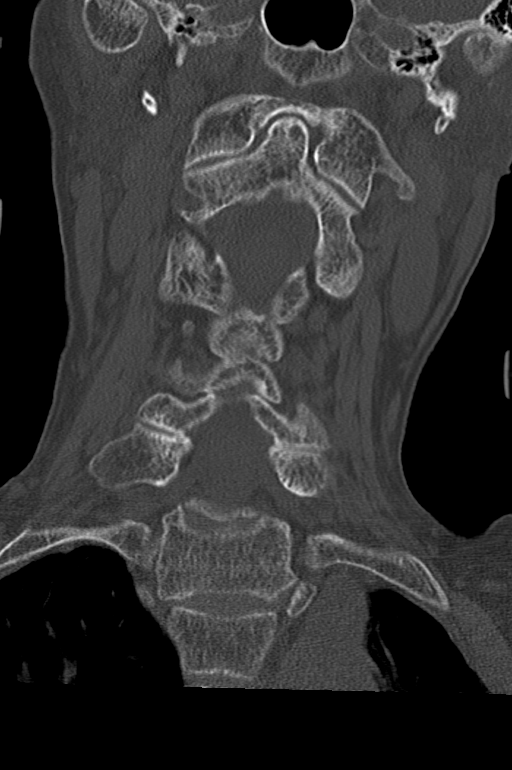
[im 40/67  bone]
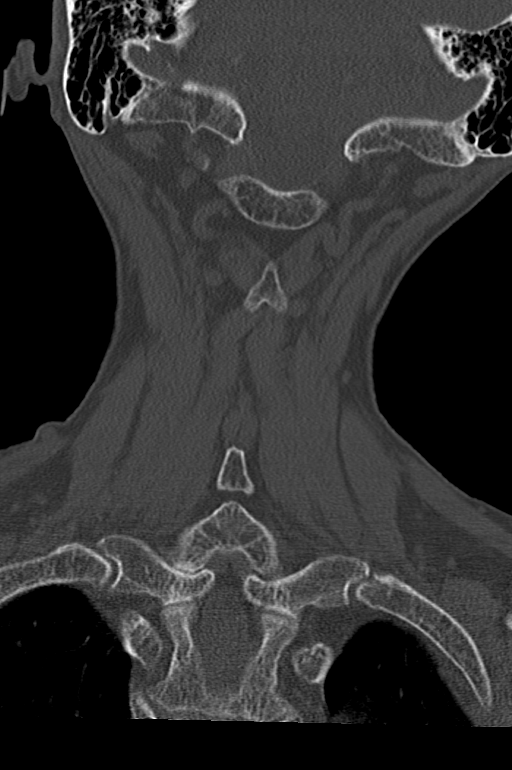

[13 of 35 positions shown; findings below may reference images not displayed]

FINDINGS: CT HEAD FINDINGS

Brain: No evidence of acute infarction, hemorrhage, hydrocephalus,
extra-axial collection or mass lesion/mass effect.

Vascular: No hyperdense vessel or unexpected calcification.

Skull: Normal. Negative for fracture or focal lesion.

Sinuses/Orbits: No acute finding.

Other: None.

CT CERVICAL SPINE FINDINGS

Alignment: Normal.

Skull base and vertebrae: No acute fracture. No primary bone lesion
or focal pathologic process. Benign, trabeculated vertebral body
hemangioma of C6.

Soft tissues and spinal canal: No prevertebral fluid or swelling. No
visible canal hematoma.

Disc levels: Mild multilevel disc space height loss and
osteophytosis.

Upper chest: Negative.

Other: None.
IMPRESSION: 1.  No acute intracranial pathology.

2.  No fracture or static subluxation of the cervical spine.

## 2020-04-13 IMAGING — CT CT ABD-PELV W/O CM
3 of 6 series · 15 of 36 positions shown, 16 images · non-contrast
Comparison: Radiographs from 09/08/2019

CLINICAL DATA: A tree fell on the patient today.  Back pain.

EXAM:
CT CHEST, ABDOMEN AND PELVIS WITHOUT CONTRAST
TECHNIQUE: Multidetector CT imaging of the chest, abdomen and pelvis was
performed following the standard protocol without IV contrast.

[Series 4: thins · axial · 0.83mm/px · z∈[-656,-144]mm · 8 of 1245 slices shown]
[im 147/1245  lung]
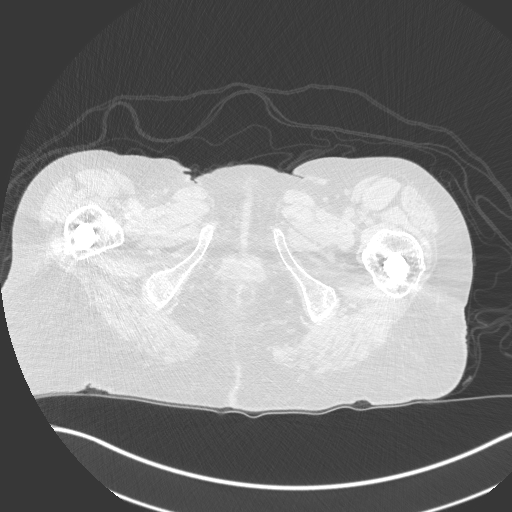
[im 293/1245  lung]
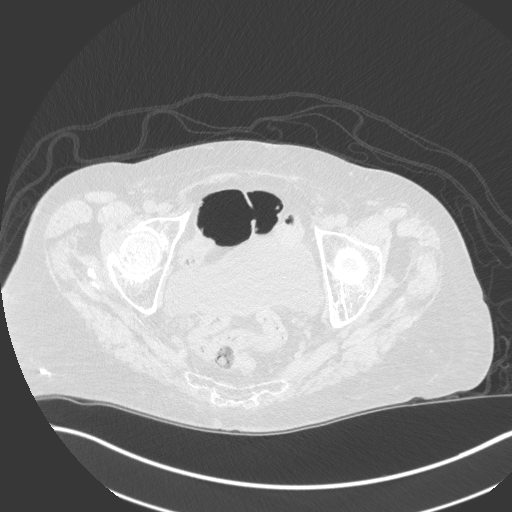
[im 440/1245  lung]
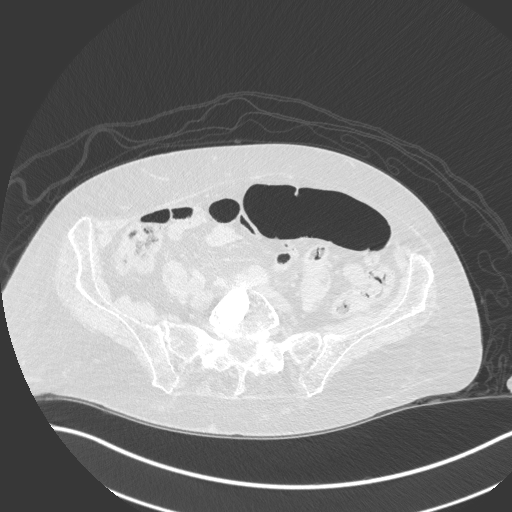
[im 586/1245  lung]
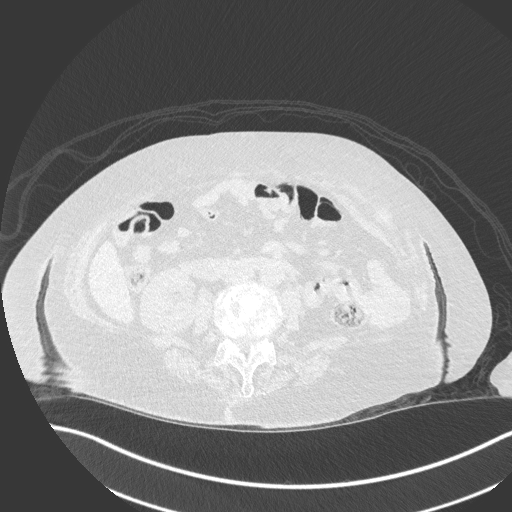
[im 732/1245  lung]
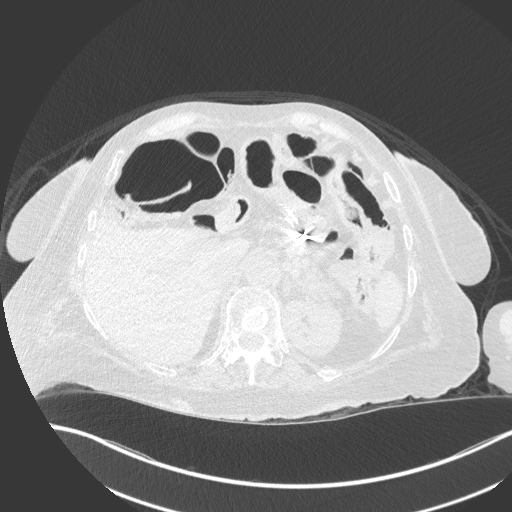
[im 879/1245  lung]
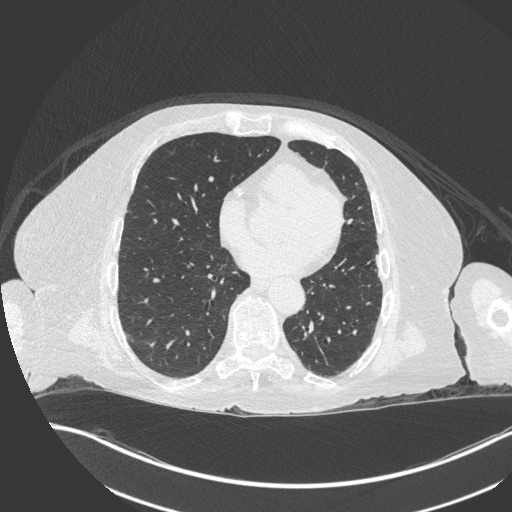
[im 1025/1245  lung]
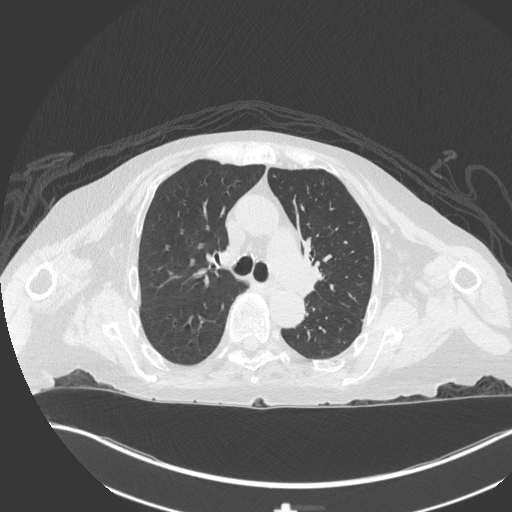
[im 1171/1245  lung]
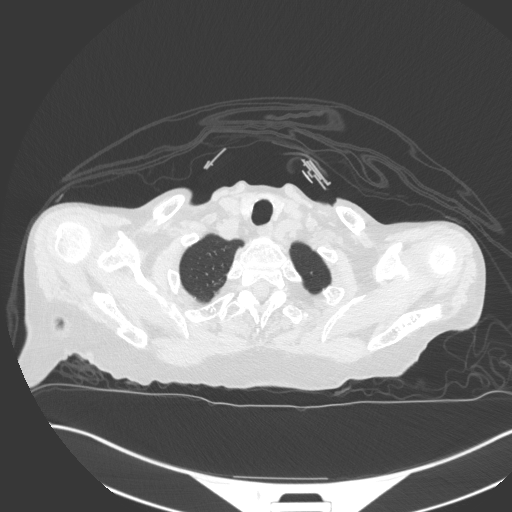

[Series 5: coronal · coronal · 0.85mm/px · 3 of 156 slices shown]
[im 32/156  lung]
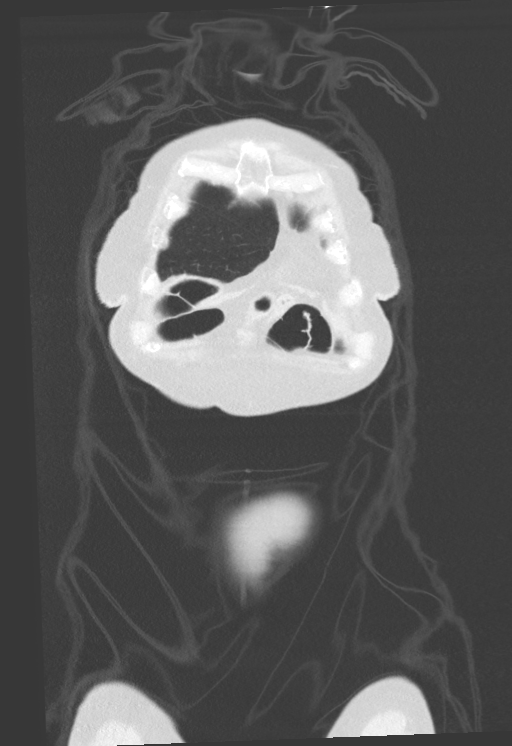
[im 63/156  lung]
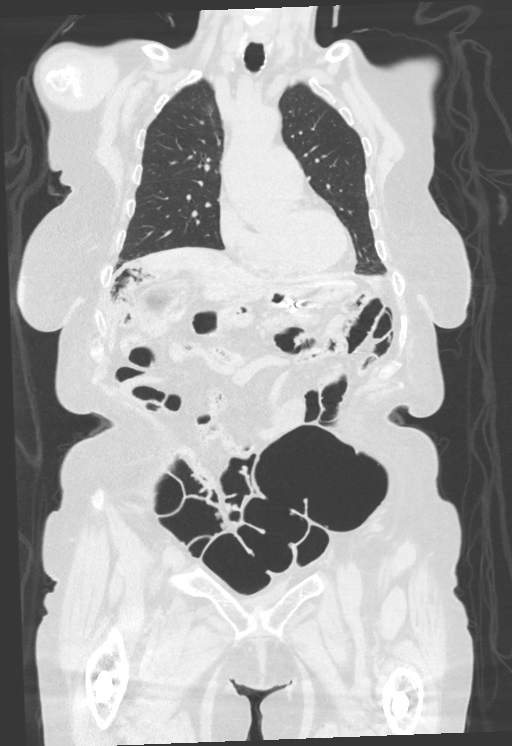
[im 94/156  lung]
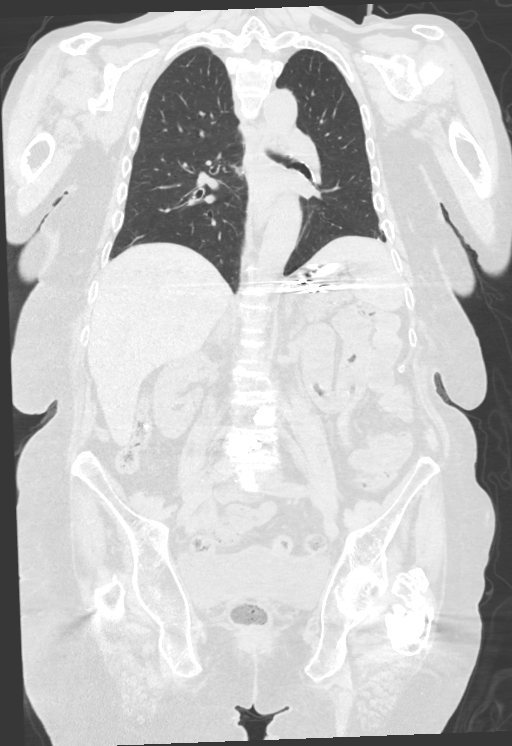

[Series 7: super d · axial · 0.83mm/px · z∈[-362,-170]mm · 4 of 400 slices shown, 5 images]
[im 80/400  mediastinal]
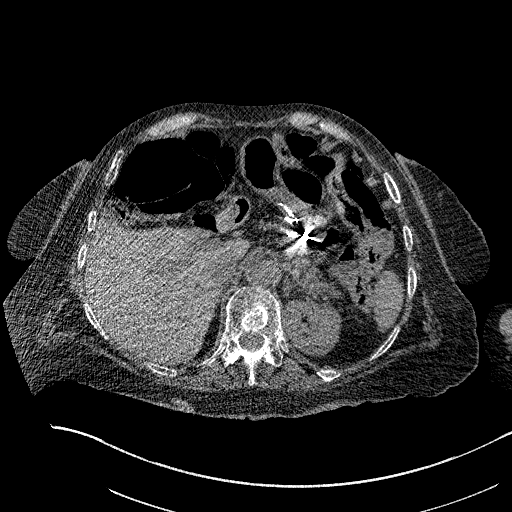
[im 80/400  lung]
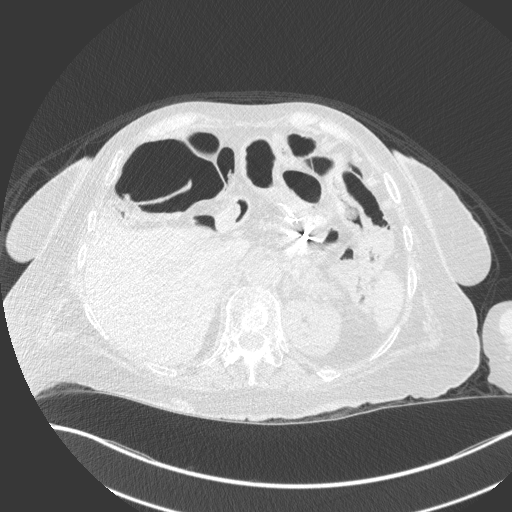
[im 160/400  lung]
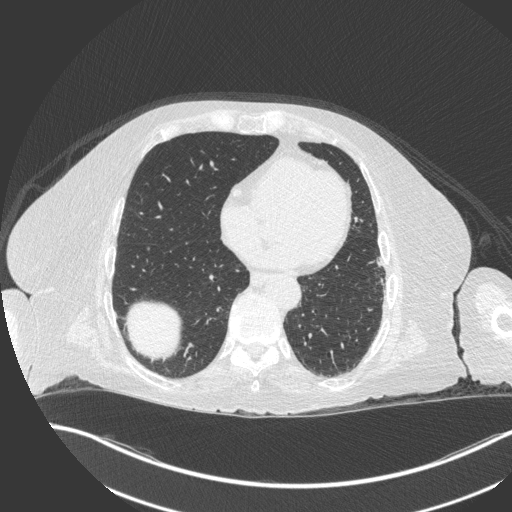
[im 240/400  lung]
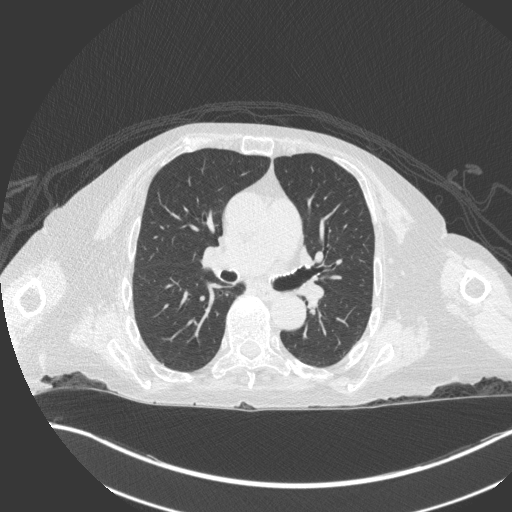
[im 320/400  lung]
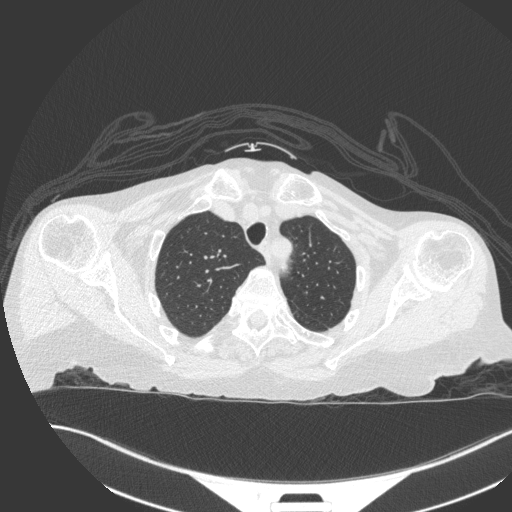

[15 of 36 positions shown; findings below may reference images not displayed]

FINDINGS: CT CHEST FINDINGS

Cardiovascular: Atherosclerotic calcification of the aortic arch and
right coronary artery.

Mediastinum/Nodes: Unremarkable

Lungs/Pleura: Mild scarring or atelectasis peripherally in the left
lower lobe. 3 mm calcified nodule in the left lower lobe on image
75/3 likely from old granulomatous disease.

Musculoskeletal: Large hemangioma in the C6 vertebral body. Other
hemangiomas at T5 and T7. Multiple bilateral rib deformities are
probably from old fractures. Schmorl's node along the superior
endplate of T12.

CT ABDOMEN PELVIS FINDINGS

Hepatobiliary: Cholecystectomy.  Otherwise unremarkable.

Pancreas: Unremarkable

Spleen: Unremarkable although partially obscured by streak artifact
from the postoperative clips related to the patient's prior gastric
bypass. No perisplenic ascites identified.

Adrenals/Urinary Tract: Unremarkable

Stomach/Bowel: Prior gastric bypass.  Otherwise unremarkable.

Vascular/Lymphatic: Mild aortoiliac atherosclerotic vascular
disease.

Reproductive: Uterus absent.  Adnexa unremarkable.

Other: No ascites identified.

Musculoskeletal: Defect in the left lateral abdominal wall
musculature for example on image 70/2 allowing for left lumbar
hernia containing loops of small bowel.

Bilateral hip screws.

Vertebral augmentations at L3, L4, and L5 with prior compression
fractures. Remote compression fractures at L1 and L2, the L2
compression is not changed from 12/25/2011 but the L1 compression
fracture is new compared to 4899. There is likely mild foraminal
impingement at the L4-5 and L5-S1 levels due to spurring.

Prominent degenerative loss of articular space in both hips.
IMPRESSION: 1. Old compression fractures at L2 through L5, with a compression
fracture at L1 which is probably remote but which was not present on
the most recent lumbar radiograph of 12/25/2011. No paraspinal edema
or other factors to establish this as acute. These multiple
fractures indicate osteoporosis.
2. Likewise there are multiple bilateral rib deformities likely from
old healed fractures.
3. Left lobar hernia containing loops of small bowel, probably
chronic given the lack of surrounding edema.
4. Aortic Atherosclerosis ([id number]). Right coronary artery
atherosclerosis.
5. No findings of ascites or solid organ injury. Please note that
the lack of IV contrast reduces sensitivity for solid organ injury,
and also there is a notable degree of streak artifact through the
spleen due to metal clips from the patient's prior gastric bypass.

## 2020-05-07 NOTE — Telephone Encounter (Signed)
Error

## 2020-05-16 ENCOUNTER — Other Ambulatory Visit (HOSPITAL_COMMUNITY)
Admission: RE | Admit: 2020-05-16 | Discharge: 2020-05-16 | Disposition: A | Discharge status: Home / Self Care | Payer: Medicare Other | Source: Ambulatory Visit | Attending: Internal Medicine | Admitting: Internal Medicine

## 2020-05-16 ENCOUNTER — Encounter (HOSPITAL_COMMUNITY)
Admission: RE | Admit: 2020-05-16 | Discharge: 2020-05-16 | Disposition: A | Discharge status: Home / Self Care | Payer: Medicare Other | Source: Ambulatory Visit | Attending: Internal Medicine | Admitting: Internal Medicine

## 2020-05-16 ENCOUNTER — Encounter (HOSPITAL_COMMUNITY): Payer: Self-pay

## 2020-05-16 ENCOUNTER — Other Ambulatory Visit: Payer: Self-pay

## 2020-05-16 DIAGNOSIS — Z01812 Encounter for preprocedural laboratory examination: Secondary | ICD-10-CM | POA: Diagnosis present

## 2020-05-16 DIAGNOSIS — Z20822 Contact with and (suspected) exposure to covid-19: Secondary | ICD-10-CM | POA: Diagnosis not present

## 2020-05-16 LAB — SARS CORONAVIRUS 2 (TAT 6-24 HRS): SARS Coronavirus 2: NEGATIVE

## 2020-05-20 ENCOUNTER — Encounter (HOSPITAL_COMMUNITY)
Admission: RE | Disposition: A | Discharge status: Home / Self Care | Payer: Self-pay | Source: Home / Self Care | Attending: Internal Medicine

## 2020-05-20 ENCOUNTER — Ambulatory Visit (HOSPITAL_COMMUNITY): Payer: Medicare Other | Admitting: Anesthesiology

## 2020-05-20 ENCOUNTER — Ambulatory Visit (HOSPITAL_COMMUNITY)
Admission: RE | Admit: 2020-05-20 | Discharge: 2020-05-20 | Disposition: A | Discharge status: Home / Self Care | Payer: Medicare Other | Attending: Internal Medicine | Admitting: Internal Medicine

## 2020-05-20 ENCOUNTER — Encounter (HOSPITAL_COMMUNITY): Payer: Self-pay | Admitting: Internal Medicine

## 2020-05-20 ENCOUNTER — Other Ambulatory Visit: Payer: Self-pay

## 2020-05-20 DIAGNOSIS — R1314 Dysphagia, pharyngoesophageal phase: Secondary | ICD-10-CM | POA: Insufficient documentation

## 2020-05-20 DIAGNOSIS — G8929 Other chronic pain: Secondary | ICD-10-CM | POA: Diagnosis not present

## 2020-05-20 DIAGNOSIS — Z79899 Other long term (current) drug therapy: Secondary | ICD-10-CM | POA: Diagnosis not present

## 2020-05-20 DIAGNOSIS — M545 Low back pain: Secondary | ICD-10-CM | POA: Insufficient documentation

## 2020-05-20 DIAGNOSIS — Q394 Esophageal web: Secondary | ICD-10-CM | POA: Diagnosis not present

## 2020-05-20 DIAGNOSIS — I10 Essential (primary) hypertension: Secondary | ICD-10-CM | POA: Diagnosis not present

## 2020-05-20 DIAGNOSIS — Z9884 Bariatric surgery status: Secondary | ICD-10-CM | POA: Insufficient documentation

## 2020-05-20 DIAGNOSIS — K2270 Barrett's esophagus without dysplasia: Secondary | ICD-10-CM | POA: Diagnosis not present

## 2020-05-20 DIAGNOSIS — Z9049 Acquired absence of other specified parts of digestive tract: Secondary | ICD-10-CM | POA: Diagnosis not present

## 2020-05-20 DIAGNOSIS — Z7982 Long term (current) use of aspirin: Secondary | ICD-10-CM | POA: Diagnosis not present

## 2020-05-20 DIAGNOSIS — R1310 Dysphagia, unspecified: Secondary | ICD-10-CM

## 2020-05-20 HISTORY — PX: BIOPSY: SHX5522

## 2020-05-20 HISTORY — PX: ESOPHAGOGASTRODUODENOSCOPY (EGD) WITH PROPOFOL: SHX5813

## 2020-05-20 HISTORY — PX: MALONEY DILATION: SHX5535

## 2020-05-20 SURGERY — ESOPHAGOGASTRODUODENOSCOPY (EGD) WITH PROPOFOL
Anesthesia: General

## 2020-05-20 MED ORDER — PROPOFOL 10 MG/ML IV BOLUS
INTRAVENOUS | Status: AC
  Filled 2020-05-20: qty 40

## 2020-05-20 MED ORDER — PROPOFOL 10 MG/ML IV BOLUS
INTRAVENOUS | Status: DC | PRN
  Administered 2020-05-20: 20 mg via INTRAVENOUS
  Administered 2020-05-20: 50 mg via INTRAVENOUS

## 2020-05-20 MED ORDER — PROPOFOL 500 MG/50ML IV EMUL
INTRAVENOUS | Status: DC | PRN
  Administered 2020-05-20: 150 ug/kg/min via INTRAVENOUS

## 2020-05-20 MED ORDER — CHLORHEXIDINE GLUCONATE CLOTH 2 % EX PADS: 6.0000 | MEDICATED_PAD | Freq: Once | CUTANEOUS | Status: DC

## 2020-05-20 MED ORDER — LACTATED RINGERS IV SOLN
INTRAVENOUS | Status: DC
  Administered 2020-05-20: 1000 mL via INTRAVENOUS

## 2020-05-20 MED ORDER — LIDOCAINE VISCOUS HCL 2 % MT SOLN
OROMUCOSAL | Status: AC
  Filled 2020-05-20: qty 15

## 2020-05-20 NOTE — Discharge Instructions (Signed)
EGD Discharge instructions Please read the instructions outlined below and refer to this sheet in the next few weeks. These discharge instructions provide you with general information on caring for yourself after you leave the hospital. Your doctor may also give you specific instructions. While your treatment has been planned according to the most current medical practices available, unavoidable complications occasionally occur. If you have any problems or questions after discharge, please call your doctor. ACTIVITY  You may resume your regular activity but move at a slower pace for the next 24 hours.   Take frequent rest periods for the next 24 hours.   Walking will help expel (get rid of) the air and reduce the bloated feeling in your abdomen.   No driving for 24 hours (because of the anesthesia (medicine) used during the test).   You may shower.   Do not sign any important legal documents or operate any machinery for 24 hours (because of the anesthesia used during the test).  NUTRITION  Drink plenty of fluids.   You may resume your normal diet.   Begin with a light meal and progress to your normal diet.   Avoid alcoholic beverages for 24 hours or as instructed by your caregiver.  MEDICATIONS  You may resume your normal medications unless your caregiver tells you otherwise.  WHAT YOU CAN EXPECT TODAY  You may experience abdominal discomfort such as a feeling of fullness or "gas" pains.  FOLLOW-UP  Your doctor will discuss the results of your test with you.  SEEK IMMEDIATE MEDICAL ATTENTION IF ANY OF THE FOLLOWING OCCUR:  Excessive nausea (feeling sick to your stomach) and/or vomiting.   Severe abdominal pain and distention (swelling).   Trouble swallowing.   Temperature over 101 F (37.8 C).   Rectal bleeding or vomiting of blood.   Your esophagus was dilated today -I did biopsy your esophagus today to evaluate possible scar tissue.  Continue omeprazole 20 mg  daily  Use Chloraseptic spray for any transient sore throat or chest discomfort you may experience over the next couple of days  Further recommendations to follow  Office visit with [place] in 6 months  At patient request, spoke to Ellen Anderson regarding results

## 2020-05-20 NOTE — Transfer of Care (Signed)
Immediate Anesthesia Transfer of Care Note  Patient: Ellen Anderson  Procedure(s) Performed: ESOPHAGOGASTRODUODENOSCOPY (EGD) WITH PROPOFOL (N/A ) MALONEY DILATION (N/A ) BIOPSY  Patient Location: PACU  Anesthesia Type:General  Level of Consciousness: awake  Airway & Oxygen Therapy: Patient Spontanous Breathing  Post-op Assessment: Report given to RN  Post vital signs: Reviewed  Last Vitals:  Vitals Value Taken Time  BP    Temp    Pulse 68 05/20/20 1354  Resp    SpO2 98 % 05/20/20 1354  Vitals shown include unvalidated device data.  Last Pain:  Vitals:   05/20/20 1332  TempSrc:   PainSc: 0-No pain      Patients Stated Pain Goal: 7 (42/37/02 3017)  Complications: No complications documented.

## 2020-05-20 NOTE — H&P (Signed)
@LOGO @   Primary Care Physician:  Jani Gravel, MD Primary Gastroenterologist:  Dr. Gala Romney  Pre-Procedure History & Physical: HPI:  Ellen Anderson is a 74 y.o. female here for recurrent esophageal dysphagia.  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  Recurrent esophageal dysphagia 9 months after procedure.  Reflux not an issue on omeprazole.  Past Medical History:  Diagnosis Date  . Anemia   . Chronic low back pain   . Degenerative joint disease   . Hip fracture, left ([place]) 03/12/2012  . Hypertension   . PONV (postoperative nausea and vomiting)     Past Surgical History:  Procedure Laterality Date  . ABDOMINAL HYSTERECTOMY    . ANKLE SURGERY Left    pins and screws  . APPENDECTOMY    . BACK SURGERY    . CATARACT EXTRACTION W/PHACO Right 02/18/2015   Procedure: CATARACT EXTRACTION PHACO AND INTRAOCULAR LENS PLACEMENT RIGHT EYE; CDE:  10.48;  Surgeon: Tonny Branch, MD;  Location: [facility];  Service: Ophthalmology;  Laterality: Right;  . CATARACT EXTRACTION W/PHACO Left 03/07/2015   Procedure: CATARACT EXTRACTION PHACO AND INTRAOCULAR LENS PLACEMENT (IOC);  Surgeon: Tonny Branch, MD;  Location: [facility];  Service: Ophthalmology;  Laterality: Left;  CDE:7.14  . CHOLECYSTECTOMY    . COLONOSCOPY N/A 03/04/2016   Procedure: COLONOSCOPY;  Surgeon: Danie Binder, MD;  Location: [facility];  Service: Endoscopy;  Laterality: N/A;  930   . ESOPHAGOGASTRODUODENOSCOPY  07/23/2004   Dr. Rehman:foreign body impactedat cervical esophagus PORK CHOP, dilation of esophageal web  . ESOPHAGOGASTRODUODENOSCOPY  2011   Dr. Oneida Alar: food bolus which would not pass through gastric remnant into jejunostomy, PORK CHOP removed. AVOID PORK CHOPS  . ESOPHAGOGASTRODUODENOSCOPY N/A 03/04/2016   Procedure: ESOPHAGOGASTRODUODENOSCOPY (EGD);  Surgeon: Danie Binder, MD;  [facility]: [facility];  Service: Endoscopy;  Laterality: N/A;  . ESOPHAGOGASTRODUODENOSCOPY (EGD) WITH PROPOFOL N/A 05/23/2019    Procedure: ESOPHAGOGASTRODUODENOSCOPY (EGD) WITH PROPOFOL;  Surgeon: Danie Binder, MD;  Location: [facility];  Service: Endoscopy;  Laterality: N/A;  1:45pm  . GASTRIC BYPASS  1981  . HIP SURGERY Bilateral    fractured bilateral hips  . INTRAMEDULLARY (IM) NAIL INTERTROCHANTERIC Right 03/08/2013   Procedure: INTRAMEDULLARY (IM) NAIL INTERTROCHANTRIC;  Surgeon: Carole Civil, MD;  Location: [facility];  Service: Orthopedics;  Laterality: Right;  . SAVORY DILATION N/A 03/04/2016   Procedure: SAVORY DILATION;  Surgeon: Danie Binder, MD;  Location: [facility];  Service: Endoscopy;  Laterality: N/A;  . SAVORY DILATION N/A 05/23/2019   Procedure: SAVORY DILATION;  Surgeon: Danie Binder, MD;  Location: [facility];  Service: Endoscopy;  Laterality: N/A;    Prior to Admission medications   Medication Sig Start Date End Date Taking? Authorizing Provider  AMITIZA 8 MCG capsule TAKE 1 CAPSULE BY MOUTH TWICE DAILY WITH FOOD Patient taking differently: Take 24 mcg by mouth 2 (two) times daily with a meal.  08/17/17  Yes Carlis Stable, NP  Ascorbic Acid (VITAMIN C WITH ROSE HIPS) 1000 MG tablet Take 1,000 mg by mouth daily.   Yes [provider]  aspirin 81 MG EC tablet Take 81 mg by mouth daily.    Yes [provider]  denosumab (PROLIA) 60 MG/ML SOLN injection Inject 60 mg into the skin every 6 (six) months. Administer in upper arm, thigh, or abdomen   Yes [provider]  diphenhydrAMINE (BENADRYL) 25 mg capsule Take 75 mg by mouth every 6 (  six) hours as needed for itching. Takes 3 before and 3 after taking taking Oxycodone    Yes [provider]  magnesium oxide (MAG-OX) 400 MG tablet Take 400 mg by mouth daily.   Yes [provider]  omeprazole (PRILOSEC) 20 MG capsule TAKE 1 CAPSULE BY MOUTH DAILY BEFORE BREAKFAST Patient taking differently: Take 20 mg by mouth daily.  11/08/19  Yes Carlis Stable, NP  Oxycodone HCl 10 MG TABS Take 10 mg by mouth 3  (three) times daily as needed. 03/09/20  Yes [provider]  promethazine (PHENERGAN) 12.5 MG tablet Take 1 tablet (12.5 mg total) by mouth every 6 (six) hours as needed for nausea or vomiting. 07/03/19  Yes Carole Civil, MD  Propylene Glycol (SYSTANE BALANCE) 0.6 % SOLN Place 1 drop into both eyes daily as needed (dry eyes).   Yes [provider]    Allergies as of 02/28/2020 - Review Complete 02/13/2020  Allergen Reaction Noted  . Amoxicillin-pot clavulanate Swelling 12/25/2011  . Ceclor [cefaclor] Swelling 12/25/2011  . Morphine and related Other (See Comments) 12/25/2011  . Sulfur Shortness Of Breath 12/25/2011  . Azithromycin Other (See Comments) 12/26/2011  . Flexeril [cyclobenzaprine] Hives and Swelling 02/20/2016  . Ivp dye [iodinated diagnostic agents] Other (See Comments) 12/25/2011  . Methadone Other (See Comments) 12/25/2011  . Percocet [oxycodone-acetaminophen] Itching 12/25/2011  . Betadine [povidone iodine] Rash 02/18/2015  . Codeine Hives and Rash 12/25/2011  . Escitalopram oxalate Other (See Comments) 12/25/2011  . Ibuprofen Hives and Rash 12/26/2011  . Tylenol [acetaminophen] Hives and Rash 12/26/2011    Family History  Problem Relation Age of Onset  . Throat cancer Mother   . COPD Father   . Stomach cancer Sister   . COPD Sister   . Colon polyps Sister   . COPD Sister   . Colon cancer Neg Hx   . Esophageal cancer Neg Hx     Social History   Socioeconomic History  . Marital status: Widowed    Spouse name: Not on file  . Number of children: Not on file  . Years of education: Not on file  . Highest education level: Not on file  Occupational History  . Not on file  Tobacco Use  . Smoking status: Never Smoker  . Smokeless tobacco: Never Used  Vaping Use  . Vaping Use: Never used  Substance and Sexual Activity  . Alcohol use: No    Alcohol/week: 0.0 standard drinks  . Drug use: No  . Sexual activity: Never    Birth  control/protection: Surgical  Other Topics Concern  . Not on file  Social History Narrative  . Not on file   Social Determinants of Health   Financial Resource Strain:   . Difficulty of Paying Living Expenses:   Food Insecurity:   . Worried About Charity fundraiser in the Last Year:   . Arboriculturist in the Last Year:   Transportation Needs:   . Film/video editor (Medical):   Marland Kitchen Lack of Transportation (Non-Medical):   Physical Activity:   . Days of Exercise per Week:   . Minutes of Exercise per Session:   Stress:   . Feeling of Stress :   Social Connections:   . Frequency of Communication with Friends and Family:   . Frequency of Social Gatherings with Friends and Family:   . Attends Religious Services:   . Active Member of Clubs or Organizations:   . Attends Club or  Organization Meetings:   Marland Kitchen Marital Status:   Intimate Partner Violence:   . Fear of Current or Ex-Partner:   . Emotionally Abused:   Marland Kitchen Physically Abused:   . Sexually Abused:     Review of Systems: See HPI, otherwise negative ROS  Physical Exam: BP 124/68   Pulse 69   Temp 97.9 F (36.6 C) (Oral)   Resp 15   Ht 5\' 2"  (1.575 m)   Wt 70.8 kg   SpO2 98%   BMI 28.53 kg/m  General:   Alert,  Well-developed, well-nourished, pleasant and cooperative in NAD Neck:  Supple; no masses or thyromegaly. No significant cervical adenopathy. Lungs:  Clear throughout to auscultation.   No wheezes, crackles, or rhonchi. No acute distress. Heart:  Regular rate and rhythm; no murmurs, clicks, rubs,  or gallops. Abdomen: Non-distended, normal bowel sounds.  Soft and nontender without appreciable mass or hepatosplenomegaly.  Pulses:  Normal pulses noted. Extremities:  Without clubbing or edema.  Impression/Plan: 74 year old lady with recurrent esophageal dysphagia.  Known esophageal web.  Here for repeat dilation as feasible/appropriate per plan.  The risks, benefits, limitations, alternatives and imponderables  have been reviewed with the patient. Potential for esophageal dilation, biopsy, etc. have also been reviewed.  Questions have been answered. All parties agreeable.     Notice: This dictation was prepared with Dragon dictation along with smaller phrase technology. Any transcriptional errors that result from this process are unintentional and may not be corrected upon review.

## 2020-05-20 NOTE — Anesthesia Preprocedure Evaluation (Signed)
Anesthesia Evaluation  Patient identified by MRN, date of birth, ID band Patient awake    Reviewed: Allergy & Precautions, H&P , NPO status , Patient's Chart, lab work & pertinent test results, reviewed documented beta blocker date and time   History of Anesthesia Complications (+) PONV  Airway Mallampati: II  TM Distance: >3 FB Neck ROM: full    Dental no notable dental hx. (+) Edentulous Upper, Edentulous Lower   Pulmonary neg pulmonary ROS,    Pulmonary exam normal breath sounds clear to auscultation       Cardiovascular Exercise Tolerance: Good hypertension, negative cardio ROS   Rhythm:regular Rate:Normal     Neuro/Psych Seizures -, Well Controlled,  negative psych ROS   GI/Hepatic negative GI ROS, Neg liver ROS,   Endo/Other  negative endocrine ROS  Renal/GU negative Renal ROS  negative genitourinary   Musculoskeletal   Abdominal   Peds  Hematology  (+) Blood dyscrasia, anemia ,   Anesthesia Other Findings   Reproductive/Obstetrics negative OB ROS                             Anesthesia Physical Anesthesia Plan  ASA: II  Anesthesia Plan: General   Post-op Pain Management:    Induction:   PONV Risk Score and Plan: Propofol infusion  Airway Management Planned:   Additional Equipment:   Intra-op Plan:   Post-operative Plan:   Informed Consent: I have reviewed the patients History and Physical, chart, labs and discussed the procedure including the risks, benefits and alternatives for the proposed anesthesia with the patient or authorized representative who has indicated his/her understanding and acceptance.     Dental Advisory Given  Plan Discussed with: CRNA  Anesthesia Plan Comments:         Anesthesia Quick Evaluation

## 2020-05-20 NOTE — Op Note (Signed)
[facility] Patient Name: Ellen Anderson Procedure Date: 05/20/2020 1:05 PM MRN: [id number] Date of Birth: [birth year] Attending MD: Norvel Richards , MD CSN: [id number] Age: 74 Admit Type: Outpatient Procedure:                Upper GI endoscopy Indications:              Dysphagia Providers:                Norvel Richards, MD, Janeece Riggers, RN, Tammy                            Vaught, RN, Aram Candela Referring MD:              Medicines:                Propofol per Anesthesia Complications:            No immediate complications. Estimated Blood Loss:     Estimated blood loss was minimal. Procedure:                After obtaining informed consent, the endoscope was                            passed under direct vision. Throughout the                            procedure, the patient's blood pressure, pulse, and                            oxygen saturations were monitored continuously. The                            [id number]-H190 ([id number]) scope was introduced through the                            mouth, and advanced to the third part of duodenum. Scope In: 1:36:05 PM Scope Out: 1:46:50 PM Total Procedure Duration: 0 hours 10 minutes 45 seconds  Findings:      Tubular office appears patent throughout its course. There was a single       "island" of salmon-colored epithelium just above the GE junction.       Nodularity. No esophagitis. Surgically altered stomach small gastric       pouch patent and normal-appearing efferent limb. Residual gastric mucosa       appeared normal.      Scope was withdrawn and a 54 [place] Maloney dilator was passed to full       insertion easily. Subsequently, a 73 [place] Maloney dilator was passed       to full insertion with ease. A look back revealed a small tear through       the UES mucosa;otherwise no apparent complication related to these       maneuvers. Finally, biopsy of the "island" of salmon-colored epithelium       taken. Impression:                Patent tubular esophagus status post dilation.  Single island of salmon-colored epithelium                            biopsied. Surgically altered stomach - consistent                            with prior history of gastric bypass surgery Moderate Sedation:      Moderate (conscious) sedation was personally administered by an       anesthesia professional. The following parameters were monitored: oxygen       saturation, heart rate, blood pressure, respiratory rate, EKG, adequacy       of pulmonary ventilation, and response to care. Recommendation:           - Patient has a contact number available for                            emergencies. The signs and symptoms of potential                            delayed complications were discussed with the                            patient. Return to normal activities tomorrow.                            Written discharge instructions were provided to the                            patient.                           - Advance diet as tolerated. Continue omeprazole 20                            mg daily. Office visit with [place] in 6 months                           Further recommendations to follow pending review of                            pathology report. Procedure Code(s):        --- Professional ---                           [phone], Esophagogastroduodenoscopy, flexible,                            transoral; diagnostic, including collection of                            specimen(s) by brushing or washing, when performed                            (separate procedure) Diagnosis Code(s):        --- Professional ---  R13.10, Dysphagia, unspecified CPT copyright 2019 American Medical Association. All rights reserved. The codes documented in this report are preliminary and upon coder review may  be revised to meet current compliance requirements. Ellen Anderson. Ellen Rudin, MD Norvel Richards, MD 05/20/2020 1:57:55 PM This report has been signed electronically. Number of Addenda: 0

## 2020-05-20 NOTE — Anesthesia Postprocedure Evaluation (Signed)
Anesthesia Post Note  Patient: Ellen Anderson  Procedure(s) Performed: ESOPHAGOGASTRODUODENOSCOPY (EGD) WITH PROPOFOL (N/A ) MALONEY DILATION (N/A ) BIOPSY  Patient location during evaluation: PACU Anesthesia Type: General Level of consciousness: awake and alert and oriented Pain management: pain level controlled Vital Signs Assessment: post-procedure vital signs reviewed and stable Respiratory status: spontaneous breathing Cardiovascular status: blood pressure returned to baseline and stable Postop Assessment: no apparent nausea or vomiting Anesthetic complications: no   No complications documented.   Last Vitals:  Vitals:   05/20/20 1242  BP: 124/68  Pulse: 69  Resp: 15  Temp: 36.6 C  SpO2: 98%    Last Pain:  Vitals:   05/20/20 1332  TempSrc:   PainSc: 0-No pain                 Briggett Tuccillo

## 2020-05-21 ENCOUNTER — Other Ambulatory Visit: Payer: Self-pay | Admitting: Gastroenterology

## 2020-05-21 ENCOUNTER — Telehealth: Payer: Self-pay | Admitting: Internal Medicine

## 2020-05-21 DIAGNOSIS — K5900 Constipation, unspecified: Secondary | ICD-10-CM

## 2020-05-21 MED ORDER — LUBIPROSTONE 24 MCG PO CAPS: 24.0000 ug | ORAL_CAPSULE | Freq: Two times a day (BID) | ORAL | 5 refills | Status: DC

## 2020-05-21 NOTE — Telephone Encounter (Signed)
Pt had EGD yesterday and said that she needed a refill of Amitza sent to [facility] on [address]. [phone]

## 2020-05-21 NOTE — Telephone Encounter (Signed)
Pt would like a refill of Amitiza 24 mcg sent to her pharmacy. Pt was changed from Amitiza 8 mcg to Amitiza 24 mcg at her last apt with [facility].

## 2020-05-21 NOTE — Telephone Encounter (Signed)
Noted  

## 2020-05-21 NOTE — Telephone Encounter (Signed)
Rx sent 

## 2020-05-21 NOTE — Telephone Encounter (Signed)
Correction. Routing to [facility] refill box.

## 2020-05-22 ENCOUNTER — Encounter: Payer: Self-pay | Admitting: Internal Medicine

## 2020-05-22 LAB — SURGICAL PATHOLOGY

## 2020-05-23 ENCOUNTER — Encounter (HOSPITAL_COMMUNITY): Payer: Self-pay | Admitting: Internal Medicine

## 2020-06-05 ENCOUNTER — Ambulatory Visit: Payer: Medicare Other | Admitting: Nurse Practitioner

## 2020-07-02 ENCOUNTER — Telehealth: Payer: Self-pay | Admitting: Emergency Medicine

## 2020-07-02 NOTE — Telephone Encounter (Signed)
Pt states she has not had a good bm in 3 weeks and she is having a lot of swelling in her abd. Pt states it in more so on her left side and she states "I look pregnant" pt denies nausea  or vomiting  And does not have any fever. Pt is taking lubiprostone but states it is not helping anymore. Pt  Taken Doculax and  has done 3 fleet enemas [phone]

## 2020-07-03 NOTE — Telephone Encounter (Signed)
She seems to have had a good result initially with Amitiza 8 mcg. Have her try to increase Amitiza to 24 mcg bid (she can take three of the 8 mcg doses). Let me know if no bowel movement in the next 24-48 hours.  She can also try to add MiraLAX 1-2 a day to see if this helps her get "over the hump"  If any severe abdominal pain, bleeding, persistent vomiting call us or go to the ER

## 2020-07-03 NOTE — Telephone Encounter (Signed)
Let's have her pick up samples of Linzess 145 mcg. Take once a day on an empty stomach (usually first thing in the morning). Give her 4 boxes to try.  Call with a progress report in 1-2 weeks.  Until she can get here to get there samples she can use the MiraLAX

## 2020-07-03 NOTE — Telephone Encounter (Signed)
Called pt and she states she is currently taking amitiza 70mcg BID and wants to know if it can be changed to something else or do you want her to continue taking the amitiza 48mcg and incorporate the mirLAX 1-2 a day

## 2020-07-04 NOTE — Telephone Encounter (Signed)
Called and notified pt of recommendations pt is agreeable to try linzess will pick up samples placed t front desk today and she will call us in 1-2 weeks with progress

## 2020-10-17 ENCOUNTER — Emergency Department (HOSPITAL_COMMUNITY): Payer: Medicare Other

## 2020-10-17 ENCOUNTER — Ambulatory Visit (HOSPITAL_COMMUNITY): Payer: Medicare Other | Admitting: Anesthesiology

## 2020-10-17 ENCOUNTER — Other Ambulatory Visit: Payer: Self-pay

## 2020-10-17 ENCOUNTER — Encounter (HOSPITAL_COMMUNITY)
Admission: EM | Disposition: A | Discharge status: Home / Self Care | Payer: Self-pay | Source: Home / Self Care | Attending: Emergency Medicine

## 2020-10-17 ENCOUNTER — Encounter (HOSPITAL_COMMUNITY): Payer: Self-pay | Admitting: Emergency Medicine

## 2020-10-17 ENCOUNTER — Ambulatory Visit (HOSPITAL_COMMUNITY)
Admission: EM | Admit: 2020-10-17 | Discharge: 2020-10-18 | Disposition: A | Discharge status: Home / Self Care | Payer: Medicare Other | Attending: Emergency Medicine | Admitting: Emergency Medicine

## 2020-10-17 DIAGNOSIS — T18128A Food in esophagus causing other injury, initial encounter: Secondary | ICD-10-CM | POA: Diagnosis not present

## 2020-10-17 DIAGNOSIS — G8929 Other chronic pain: Secondary | ICD-10-CM | POA: Diagnosis not present

## 2020-10-17 DIAGNOSIS — Z7982 Long term (current) use of aspirin: Secondary | ICD-10-CM | POA: Insufficient documentation

## 2020-10-17 DIAGNOSIS — M549 Dorsalgia, unspecified: Secondary | ICD-10-CM | POA: Diagnosis not present

## 2020-10-17 DIAGNOSIS — Z79899 Other long term (current) drug therapy: Secondary | ICD-10-CM | POA: Diagnosis not present

## 2020-10-17 DIAGNOSIS — Z20822 Contact with and (suspected) exposure to covid-19: Secondary | ICD-10-CM | POA: Insufficient documentation

## 2020-10-17 DIAGNOSIS — M810 Age-related osteoporosis without current pathological fracture: Secondary | ICD-10-CM | POA: Insufficient documentation

## 2020-10-17 DIAGNOSIS — Z9884 Bariatric surgery status: Secondary | ICD-10-CM | POA: Diagnosis not present

## 2020-10-17 DIAGNOSIS — T18108A Unspecified foreign body in esophagus causing other injury, initial encounter: Secondary | ICD-10-CM

## 2020-10-17 DIAGNOSIS — X58XXXA Exposure to other specified factors, initial encounter: Secondary | ICD-10-CM | POA: Insufficient documentation

## 2020-10-17 DIAGNOSIS — K219 Gastro-esophageal reflux disease without esophagitis: Secondary | ICD-10-CM | POA: Insufficient documentation

## 2020-10-17 DIAGNOSIS — K5909 Other constipation: Secondary | ICD-10-CM | POA: Diagnosis not present

## 2020-10-17 HISTORY — PX: ESOPHAGOGASTRODUODENOSCOPY (EGD) WITH PROPOFOL: SHX5813

## 2020-10-17 LAB — RESPIRATORY PANEL BY RT PCR (FLU A&B, COVID)
Influenza A by PCR: NEGATIVE
Influenza B by PCR: NEGATIVE
SARS Coronavirus 2 by RT PCR: NEGATIVE

## 2020-10-17 SURGERY — ESOPHAGOGASTRODUODENOSCOPY (EGD) WITH PROPOFOL
Anesthesia: General

## 2020-10-17 MED ORDER — PROPOFOL 10 MG/ML IV BOLUS
INTRAVENOUS | Status: AC
  Filled 2020-10-17: qty 20

## 2020-10-17 MED ORDER — DEXAMETHASONE SODIUM PHOSPHATE 4 MG/ML IJ SOLN
INTRAMUSCULAR | Status: DC | PRN
  Administered 2020-10-17: 4 mg via INTRAVENOUS

## 2020-10-17 MED ORDER — FENTANYL CITRATE (PF) 100 MCG/2ML IJ SOLN
INTRAMUSCULAR | Status: DC | PRN
  Administered 2020-10-17: 25 ug via INTRAVENOUS

## 2020-10-17 MED ORDER — CLINDAMYCIN PHOSPHATE 600 MG/50ML IV SOLN
600.0000 mg | Freq: Once | INTRAVENOUS | Status: AC
  Administered 2020-10-17: 600 mg via INTRAVENOUS
  Filled 2020-10-17: qty 50

## 2020-10-17 MED ORDER — LIDOCAINE 2% (20 MG/ML) 5 ML SYRINGE
INTRAMUSCULAR | Status: DC | PRN
  Administered 2020-10-17: 60 mg via INTRAVENOUS

## 2020-10-17 MED ORDER — STERILE WATER FOR IRRIGATION IR SOLN
Status: DC | PRN
  Administered 2020-10-17: 1.5 mL

## 2020-10-17 MED ORDER — SUCCINYLCHOLINE CHLORIDE 200 MG/10ML IV SOSY
PREFILLED_SYRINGE | INTRAVENOUS | Status: DC | PRN
  Administered 2020-10-17: 100 mg via INTRAVENOUS

## 2020-10-17 MED ORDER — ONDANSETRON HCL 4 MG/2ML IJ SOLN
INTRAMUSCULAR | Status: DC | PRN
  Administered 2020-10-17: 4 mg via INTRAVENOUS

## 2020-10-17 MED ORDER — DEXAMETHASONE SODIUM PHOSPHATE 4 MG/ML IJ SOLN
INTRAMUSCULAR | Status: AC
  Filled 2020-10-17: qty 1

## 2020-10-17 MED ORDER — PROPOFOL 10 MG/ML IV BOLUS
INTRAVENOUS | Status: DC | PRN
  Administered 2020-10-17: 80 mg via INTRAVENOUS

## 2020-10-17 MED ORDER — LIDOCAINE HCL URETHRAL/MUCOSAL 2 % EX GEL
CUTANEOUS | Status: DC | PRN
  Administered 2020-10-17: 1 via INTRATRACHEAL

## 2020-10-17 MED ORDER — LIDOCAINE 2% (20 MG/ML) 5 ML SYRINGE
INTRAMUSCULAR | Status: AC
  Filled 2020-10-17: qty 5

## 2020-10-17 MED ORDER — FENTANYL CITRATE (PF) 100 MCG/2ML IJ SOLN
50.0000 ug | Freq: Once | INTRAMUSCULAR | Status: AC
  Administered 2020-10-17: 50 ug via INTRAVENOUS
  Filled 2020-10-17: qty 2

## 2020-10-17 MED ORDER — SUCCINYLCHOLINE CHLORIDE 200 MG/10ML IV SOSY
PREFILLED_SYRINGE | INTRAVENOUS | Status: AC
  Filled 2020-10-17: qty 10

## 2020-10-17 MED ORDER — LACTATED RINGERS IV SOLN: INTRAVENOUS | Status: DC | PRN

## 2020-10-17 MED ORDER — ONDANSETRON HCL 4 MG/2ML IJ SOLN
4.0000 mg | Freq: Once | INTRAMUSCULAR | Status: AC
  Administered 2020-10-17: 4 mg via INTRAVENOUS
  Filled 2020-10-17: qty 2

## 2020-10-17 MED ORDER — ONDANSETRON HCL 4 MG/2ML IJ SOLN
INTRAMUSCULAR | Status: AC
  Filled 2020-10-17: qty 2

## 2020-10-17 MED ORDER — OMEPRAZOLE 20 MG PO CPDR: 20.0000 mg | DELAYED_RELEASE_CAPSULE | Freq: Two times a day (BID) | ORAL | 1 refills | Status: DC

## 2020-10-17 MED ORDER — FENTANYL CITRATE (PF) 100 MCG/2ML IJ SOLN
INTRAMUSCULAR | Status: AC
  Filled 2020-10-17: qty 2

## 2020-10-17 NOTE — ED Triage Notes (Signed)
Pt c/o chicken bone stuck in her throat x 2-3 hours; reports hx of same 04/2020; pt reports she is unable to drink anything-tried water and Pepsi

## 2020-10-17 NOTE — H&P (Signed)
Consulting  Provider: Dr. Laverta Baltimore Primary Care Physician:  Jani Gravel, MD Primary Gastroenterologist:  Dr. Gala Romney  Reason for Consultation: Esophageal obstruction  HPI:  Ellen Anderson is a 74 y.o. female with a past medical history of GERD, chronic constipation, chronic back pain, who presented to [facility], ER earlier this evening with chief complaint of esophageal food obstruction. Patient states she was eating chicken tonight when she inadvertently swallowed a bone. Feels as though it is "stuck" in her esophagus. Is able to tolerate her secretions. She states she try to drink liquids after this happened and she regurgitated fluids through her nose. Has not attempted to drink anything since that time. In the ER, patient had x-rays which showed possible retained foreign body within expected location of the proximal esophagus. Subsequent CT scan which I personally reviewed showed a 2.7 cm curvilinear chicken bone lodged in the proximal cervical esophagus at the C6-C7 level. No evidence of perforation. Patient is chronically maintained on omeprazole for her reflux.  Past Medical History:  Diagnosis Date  . Anemia   . Chronic low back pain   . Degenerative joint disease   . Hip fracture, left ([place]) 03/12/2012  . Hypertension   . PONV (postoperative nausea and vomiting)     Past Surgical History:  Procedure Laterality Date  . ABDOMINAL HYSTERECTOMY    . ANKLE SURGERY Left    pins and screws  . APPENDECTOMY    . BACK SURGERY    . BIOPSY  05/20/2020   Procedure: BIOPSY;  Surgeon: Daneil Dolin, MD;  [facility]: [facility];  Service: Endoscopy;;  . CATARACT EXTRACTION W/PHACO Right 02/18/2015   Procedure: CATARACT EXTRACTION PHACO AND INTRAOCULAR LENS PLACEMENT RIGHT EYE; CDE:  10.48;  Surgeon: Tonny Branch, MD;  Location: [facility];  Service: Ophthalmology;  Laterality: Right;  . CATARACT EXTRACTION W/PHACO Left 03/07/2015   Procedure: CATARACT EXTRACTION PHACO AND INTRAOCULAR LENS PLACEMENT (IOC);   Surgeon: Tonny Branch, MD;  Location: [facility];  Service: Ophthalmology;  Laterality: Left;  CDE:7.14  . CHOLECYSTECTOMY    . COLONOSCOPY N/A 03/04/2016   Procedure: COLONOSCOPY;  Surgeon: Danie Binder, MD;  [facility];  Service: Endoscopy;  Laterality: N/A;  930   . ESOPHAGOGASTRODUODENOSCOPY  07/23/2004   Dr. Rehman:foreign body impactedat cervical esophagus PORK CHOP, dilation of esophageal web  . ESOPHAGOGASTRODUODENOSCOPY  2011   Dr. Oneida Alar: food bolus which would not pass through gastric remnant into jejunostomy, PORK CHOP removed. AVOID PORK CHOPS  . ESOPHAGOGASTRODUODENOSCOPY N/A 03/04/2016   Procedure: ESOPHAGOGASTRODUODENOSCOPY (EGD);  Surgeon: Danie Binder, MD;  [facility];  Service: Endoscopy;  Laterality: N/A;  . ESOPHAGOGASTRODUODENOSCOPY (EGD) WITH PROPOFOL N/A 05/23/2019   Procedure: ESOPHAGOGASTRODUODENOSCOPY (EGD) WITH PROPOFOL;  Surgeon: Danie Binder, MD;  [facility];  Service: Endoscopy;  Laterality: N/A;  1:45pm  . ESOPHAGOGASTRODUODENOSCOPY (EGD) WITH PROPOFOL N/A 05/20/2020   Procedure: ESOPHAGOGASTRODUODENOSCOPY (EGD) WITH PROPOFOL;  Surgeon: Daneil Dolin, MD;  [facility];  Service: Endoscopy;  Laterality: N/A;  2:30pm  . GASTRIC BYPASS  1981  . HIP SURGERY Bilateral    fractured bilateral hips  . INTRAMEDULLARY (IM) NAIL INTERTROCHANTERIC Right 03/08/2013   Procedure: INTRAMEDULLARY (IM) NAIL INTERTROCHANTRIC;  Surgeon: Carole Civil, MD;  Location: [facility];  Service: Orthopedics;  Laterality: Right;  . MALONEY DILATION N/A 05/20/2020   Procedure: Venia Minks DILATION;  Surgeon: Daneil Dolin, MD;  [facility];  Service: Endoscopy;  Laterality: N/A;  .  SAVORY DILATION N/A 03/04/2016   Procedure: SAVORY DILATION;  Surgeon: Danie Binder, MD;  [facility];  Service: Endoscopy;  Laterality: N/A;  . SAVORY DILATION N/A 05/23/2019   Procedure: SAVORY DILATION;  Surgeon: Danie Binder, MD;   [facility];  Service: Endoscopy;  Laterality: N/A;    Prior to Admission medications   Medication Sig Start Date End Date Taking? Authorizing Provider  Ascorbic Acid (VITAMIN C WITH ROSE HIPS) 1000 MG tablet Take 1,000 mg by mouth daily.    [provider]  aspirin 81 MG EC tablet Take 81 mg by mouth daily.     [provider]  denosumab (PROLIA) 60 MG/ML SOLN injection Inject 60 mg into the skin every 6 (six) months. Administer in upper arm, thigh, or abdomen    [provider]  diphenhydrAMINE (BENADRYL) 25 mg capsule Take 75 mg by mouth every 6 (six) hours as needed for itching. Takes 3 before and 3 after taking taking Oxycodone     [provider]  lubiprostone (AMITIZA) 24 MCG capsule Take 1 capsule (24 mcg total) by mouth 2 (two) times daily with a meal. 05/21/20   Erenest Rasher, PA-C  magnesium oxide (MAG-OX) 400 MG tablet Take 400 mg by mouth daily.    [provider]  omeprazole (PRILOSEC) 20 MG capsule TAKE 1 CAPSULE BY MOUTH DAILY BEFORE BREAKFAST Patient taking differently: Take 20 mg by mouth daily.  11/08/19   Carlis Stable, NP  Oxycodone HCl 10 MG TABS Take 10 mg by mouth 3 (three) times daily as needed. 03/09/20   [provider]  promethazine (PHENERGAN) 12.5 MG tablet Take 1 tablet (12.5 mg total) by mouth every 6 (six) hours as needed for nausea or vomiting. 07/03/19   Carole Civil, MD  Propylene Glycol (SYSTANE BALANCE) 0.6 % SOLN Place 1 drop into both eyes daily as needed (dry eyes).    [provider]    No current facility-administered medications for this encounter.   Current Outpatient Medications  Medication Sig Dispense Refill  . Ascorbic Acid (VITAMIN C WITH ROSE HIPS) 1000 MG tablet Take 1,000 mg by mouth daily.    Marland Kitchen aspirin 81 MG EC tablet Take 81 mg by mouth daily.     Marland Kitchen denosumab (PROLIA) 60 MG/ML SOLN injection Inject 60 mg into the skin every 6 (six) months. Administer in upper  arm, thigh, or abdomen    . diphenhydrAMINE (BENADRYL) 25 mg capsule Take 75 mg by mouth every 6 (six) hours as needed for itching. Takes 3 before and 3 after taking taking Oxycodone     . lubiprostone (AMITIZA) 24 MCG capsule Take 1 capsule (24 mcg total) by mouth 2 (two) times daily with a meal. 60 capsule 5  . magnesium oxide (MAG-OX) 400 MG tablet Take 400 mg by mouth daily.    Marland Kitchen omeprazole (PRILOSEC) 20 MG capsule TAKE 1 CAPSULE BY MOUTH DAILY BEFORE BREAKFAST (Patient taking differently: Take 20 mg by mouth daily. ) 90 capsule 3  . Oxycodone HCl 10 MG TABS Take 10 mg by mouth 3 (three) times daily as needed.    . promethazine (PHENERGAN) 12.5 MG tablet Take 1 tablet (12.5 mg total) by mouth every 6 (six) hours as needed for nausea or vomiting. 30 tablet 0  . Propylene Glycol (SYSTANE BALANCE) 0.6 % SOLN Place 1 drop into both eyes daily as needed (dry eyes).      Allergies as of 10/17/2020 -  Review Complete 10/17/2020  Allergen Reaction Noted  . Amoxicillin-pot clavulanate Swelling 12/25/2011  . Ceclor [cefaclor] Swelling 12/25/2011  . Morphine and related Other (See Comments) 12/25/2011  . Sulfur Shortness Of Breath 12/25/2011  . Azithromycin Other (See Comments) 12/26/2011  . Flexeril [cyclobenzaprine] Hives and Swelling 02/20/2016  . Ivp dye [iodinated diagnostic agents] Other (See Comments) 12/25/2011  . Methadone Other (See Comments) 12/25/2011  . Percocet [oxycodone-acetaminophen] Itching 12/25/2011  . Betadine [povidone iodine] Rash 02/18/2015  . Codeine Hives and Rash 12/25/2011  . Escitalopram oxalate Other (See Comments) 12/25/2011  . Ibuprofen Hives and Rash 12/26/2011  . Tylenol [acetaminophen] Hives and Rash 12/26/2011    Family History  Problem Relation Age of Onset  . Throat cancer Mother   . COPD Father   . Stomach cancer Sister   . COPD Sister   . Colon polyps Sister   . COPD Sister   . Colon cancer Neg Hx   . Esophageal cancer Neg Hx     Social History    Socioeconomic History  . Marital status: Widowed    Spouse name: Not on file  . Number of children: Not on file  . Years of education: Not on file  . Highest education level: Not on file  Occupational History  . Not on file  Tobacco Use  . Smoking status: Never Smoker  . Smokeless tobacco: Never Used  Vaping Use  . Vaping Use: Never used  Substance and Sexual Activity  . Alcohol use: No    Alcohol/week: 0.0 standard drinks  . Drug use: No  . Sexual activity: Never    Birth control/protection: Surgical  Other Topics Concern  . Not on file  Social History Narrative  . Not on file   Social Determinants of Health   Financial Resource Strain:   . Difficulty of Paying Living Expenses: Not on file  Food Insecurity:   . Worried About Charity fundraiser in the Last Year: Not on file  . Ran Out of Food in the Last Year: Not on file  Transportation Needs:   . Lack of Transportation (Medical): Not on file  . Lack of Transportation (Non-Medical): Not on file  Physical Activity:   . Days of Exercise per Week: Not on file  . Minutes of Exercise per Session: Not on file  Stress:   . Feeling of Stress : Not on file  Social Connections:   . Frequency of Communication with Friends and Family: Not on file  . Frequency of Social Gatherings with Friends and Family: Not on file  . Attends Religious Services: Not on file  . Active Member of Clubs or Organizations: Not on file  . Attends Archivist Meetings: Not on file  . Marital Status: Not on file  Intimate Partner Violence:   . Fear of Current or Ex-Partner: Not on file  . Emotionally Abused: Not on file  . Physically Abused: Not on file  . Sexually Abused: Not on file    Review of Systems: General: Negative for anorexia, weight loss, fever, chills, fatigue, weakness. Eyes: Negative for vision changes.  ENT: Negative for hoarseness, difficulty swallowing , nasal congestion. CV: Negative for chest pain, angina,  palpitations, dyspnea on exertion, peripheral edema.  Respiratory: Negative for dyspnea at rest, dyspnea on exertion, cough, sputum, wheezing.  GI: See history of present illness. GU:  Negative for dysuria, hematuria, urinary incontinence, urinary frequency, nocturnal urination.  MS: Negative for joint pain, low back pain.  Derm: Negative  for rash or itching.  Neuro: Negative for weakness, abnormal sensation, seizure, frequent headaches, memory loss, confusion.  Psych: Negative for anxiety, depression, suicidal ideation, hallucinations.  Endo: Negative for unusual weight change.  Heme: Negative for bruising or bleeding. Allergy: Negative for rash or hives.  Physical Exam: Vital signs in last 24 hours: Temp:  [97.9 F (36.6 C)] 97.9 F (36.6 C) (11/11 1917) Pulse Rate:  [71-75] 75 (11/11 2243) Resp:  [16-18] 17 (11/11 2243) BP: (109-131)/(71-90) 131/90 (11/11 2243) SpO2:  [97 %-99 %] 98 % (11/11 2243) Weight:  [69.1 kg] 69.1 kg (11/11 1918)   General:   Alert,  Well-developed, well-nourished, pleasant and cooperative in NAD Head:  Normocephalic and atraumatic. Eyes:  Sclera clear, no icterus.   Conjunctiva pink. Ears:  Normal auditory acuity. Nose:  No deformity, discharge,  or lesions. Mouth:  No deformity or lesions, dentition normal. Neck:  Supple; no masses or thyromegaly. Lungs:  Clear throughout to auscultation.   No wheezes, crackles, or rhonchi. No acute distress. Heart:  Regular rate and rhythm; no murmurs, clicks, rubs,  or gallops. Abdomen:  Soft, nontender and nondistended. No masses, hepatosplenomegaly or hernias noted. Normal bowel sounds, without guarding, and without rebound.   Rectal:  Deferred until time of colonoscopy.   Msk:  Symmetrical without gross deformities. Normal posture. Pulses:  Normal pulses noted. Extremities:  Without clubbing or edema. Neurologic:  Alert and  oriented x4;  grossly normal neurologically. Skin:  Intact without significant lesions  or rashes. Cervical Nodes:  No significant cervical adenopathy. Psych:  Alert and cooperative. Normal mood and affect.  Intake/Output from previous day: No intake/output data recorded. Intake/Output this shift: No intake/output data recorded.  Lab Results: No results for input(s): WBC, HGB, HCT, PLT in the last 72 hours. BMET No results for input(s): NA, K, CL, CO2, GLUCOSE, BUN, CREATININE, CALCIUM in the last 72 hours. LFT No results for input(s): PROT, ALBUMIN, AST, ALT, ALKPHOS, BILITOT, BILIDIR, IBILI in the last 72 hours. PT/INR No results for input(s): LABPROT, INR in the last 72 hours. Hepatitis Panel No results for input(s): HEPBSAG, HCVAB, HEPAIGM, HEPBIGM in the last 72 hours. C-Diff No results for input(s): CDIFFTOX in the last 72 hours.  Studies/Results: DG Neck Soft Tissue  Result Date: 10/17/2020 CLINICAL DATA:  Foreign body.  Ingested chicken bone. EXAM: NECK SOFT TISSUES - 1+ VIEW COMPARISON:  None. FINDINGS: There is a curvilinear calcific density seen anterior to C7 with a small amount of subcutaneous gas noted within this region possibly representing a retained calcified foreign body. Less likely, this could represent senescent calcification of the posterior aspect of the cricoid cartilage. The adenoidal and tonsillar shadows are within normal limits. The hypopharynx is not well aerated, but is unremarkable. Epiglottic and aryepiglottic folds are unremarkable. Subglottic airway is widely patent. IMPRESSION: Possible retained foreign body within the a expected location of the proximal esophagus with a small amount of subcutaneous gas. This should be further assessed with dedicated CT imaging of the neck without contrast. Electronically Signed   By: Fidela Salisbury MD   On: 10/17/2020 20:45   CT Soft Tissue Neck Wo Contrast  Result Date: 10/17/2020 CLINICAL DATA:  74 year old female with suspected chicken bone stuck in throat for the past several hours. EXAM: CT NECK  WITHOUT CONTRAST TECHNIQUE: Multidetector CT imaging of the neck was performed following the standard protocol without intravenous contrast. COMPARISON:  Neck radiographs today. FINDINGS: Pharynx and larynx: Laryngeal and pharyngeal soft tissue contours remain within normal  limits. Negative noncontrast parapharyngeal and retropharyngeal spaces. No tracking soft tissue gas in the neck. Within the proximal aspect of the cervical esophagus curvilinear chicken bone and associated debris is lodged as suspected on the earlier radiographs. This is at the C6-C7 spinal level. See series 2, images 83-86 and coronal images 55 through 58. Length of the lodged chicken bone is up to 2.7 cm. The wall of the esophagus is mildly stretched in this region but there is no extraluminal gas or fluid to indicate perforation. Distal to the foreign body the visible esophagus is normal. Salivary glands: Negative sublingual space, noncontrast submandibular and parotid glands. Thyroid: Negative. Lymph nodes: No cervical lymphadenopathy. Vascular: Vascular patency is not evaluated in the absence of IV contrast. Mild calcified atherosclerosis in the neck and at the skull base. Limited intracranial: Negative. Visualized orbits: Postoperative changes to both globes, otherwise negative. Mastoids and visualized paranasal sinuses: Chronic right maxillary sinusitis with mucoperiosteal thickening. Otherwise well aerated. Skeleton: Osteopenia. Absent dentition. Benign vertebral hemangioma at C6. No acute osseous abnormality identified. Upper chest: Negative. IMPRESSION: 1. Confirmed retained curvilinear chicken bone (up to 2.7 cm in length) and debris lodged in the proximal cervical esophagus corresponding to the C6-C7 level. No evidence of esophageal perforation. 2. Otherwise negative noncontrast Neck CT; chronic right maxillary sinusitis. Electronically Signed   By: Genevie Ann M.D.   On: 10/17/2020 21:33    Impression: *Foreign body in the  esophagus *Esophageal obstruction due to above  Plan: We will proceed with emergent EGD to remove esophageal foreign body as this is a potentially life-threatening situation.The risks including infection, bleed, or perforation as well as benefits, limitations, alternatives and imponderables have been reviewed with the patient. Potential for esophageal dilation, biopsy, etc. have also been reviewed.  Questions have been answered. All parties agreeable.  Keep patient NPO for now  Elon Alas. Abbey Chatters, D.O. Gastroenterology and Hepatology [facility]    LOS: 0 days     10/17/2020, 11:13 PM

## 2020-10-17 NOTE — Discharge Instructions (Signed)
EGD Discharge instructions Please read the instructions outlined below and refer to this sheet in the next few weeks. These discharge instructions provide you with general information on caring for yourself after you leave the hospital. Your doctor may also give you specific instructions. While your treatment has been planned according to the most current medical practices available, unavoidable complications occasionally occur. If you have any problems or questions after discharge, please call your doctor. ACTIVITY  You may resume your regular activity but move at a slower pace for the next 24 hours.   Take frequent rest periods for the next 24 hours.   Walking will help expel (get rid of) the air and reduce the bloated feeling in your abdomen.   No driving for 24 hours (because of the anesthesia (medicine) used during the test).   You may shower.   Do not sign any important legal documents or operate any machinery for 24 hours (because of the anesthesia used during the test).  NUTRITION  Drink plenty of fluids.   You may resume your normal diet.   Begin with a light meal and progress to your normal diet.   Avoid alcoholic beverages for 24 hours or as instructed by your caregiver.  MEDICATIONS  You may resume your normal medications unless your caregiver tells you otherwise.  WHAT YOU CAN EXPECT TODAY  You may experience abdominal discomfort such as a feeling of fullness or "gas" pains.  FOLLOW-UP  Your doctor will discuss the results of your test with you.  SEEK IMMEDIATE MEDICAL ATTENTION IF ANY OF THE FOLLOWING OCCUR:  Excessive nausea (feeling sick to your stomach) and/or vomiting.   Severe abdominal pain and distention (swelling).   Trouble swallowing.   Temperature over 101 F (37.8 C).   Rectal bleeding or vomiting of blood.    Food bolus was removed. I want you to increase your Omeprazole to twice daily for the next 8 weeks. Be sure to cut up your food into  very small pieces. Follow up with GI as previously scheduled.  I hope you have a great rest of your week!  Elon Alas. Abbey Chatters, D.O. Gastroenterology and Hepatology [facility]

## 2020-10-17 NOTE — Anesthesia Preprocedure Evaluation (Addendum)
Anesthesia Evaluation  Patient identified by MRN, date of birth, ID band Patient awake    Reviewed: Allergy & Precautions, NPO status , Patient's Chart, lab work & pertinent test results  History of Anesthesia Complications (+) PONV and history of anesthetic complications  Airway Mallampati: II  TM Distance: >3 FB Neck ROM: Full    Dental  (+) Edentulous Upper, Edentulous Lower   Pulmonary neg pulmonary ROS,    Pulmonary exam normal breath sounds clear to auscultation       Cardiovascular Exercise Tolerance: Good hypertension, Pt. on medications Normal cardiovascular exam Rhythm:Regular Rate:Normal     Neuro/Psych neg Seizures negative psych ROS   GI/Hepatic Neg liver ROS, GERD  Medicated,  Endo/Other  negative endocrine ROS  Renal/GU negative Renal ROS     Musculoskeletal  (+) Arthritis ,   Abdominal   Peds  Hematology  (+) anemia ,   Anesthesia Other Findings   Reproductive/Obstetrics                            Anesthesia Physical Anesthesia Plan  ASA: II and emergent  Anesthesia Plan: General   Post-op Pain Management:    Induction: Intravenous  PONV Risk Score and Plan: 3 and Ondansetron and Dexamethasone  Airway Management Planned: Oral ETT  Additional Equipment:   Intra-op Plan:   Post-operative Plan: Extubation in OR  Informed Consent: I have reviewed the patients History and Physical, chart, labs and discussed the procedure including the risks, benefits and alternatives for the proposed anesthesia with the patient or authorized representative who has indicated his/her understanding and acceptance.       Plan Discussed with: Surgeon  Anesthesia Plan Comments:       Anesthesia Quick Evaluation

## 2020-10-17 NOTE — Op Note (Signed)
[facility] Patient Name: Ellen Anderson Procedure Date: 10/17/2020 11:13 PM MRN: [id number] Date of Birth: [birth year] Attending MD: Elon Alas. Abbey Chatters DO CSN: [id number] Age: 74 Admit Type: Outpatient Procedure:                Upper GI endoscopy Indications:              Foreign body in the esophagus Providers:                Elon Alas. Abbey Chatters, DO, Charlsie Quest. Theda Sers RN, RN,                            Aram Candela Referring MD:              Medicines:                See the Anesthesia note for documentation of the                            administered medications Complications:            No immediate complications. Estimated Blood Loss:     Estimated blood loss was minimal. Procedure:                Pre-Anesthesia Assessment:                           - The anesthesia plan was to use general anesthesia.                           After obtaining informed consent, the endoscope was                            passed under direct vision. Throughout the                            procedure, the patient's blood pressure, pulse, and                            oxygen saturations were monitored continuously. The                            GIF-H190 ([id number]) was introduced through the                            mouth, and advanced to the efferent jejunal loop.                            The upper GI endoscopy was accomplished without                            difficulty. The patient tolerated the procedure                            well. Scope In: 11:42:11 PM Scope Out: 11:46:03 PM Total Procedure Duration: 0 hours 3 minutes 52 seconds  Findings:      Food was found in the upper third of  the esophagus. Removed with raptor       tooth grasper. Proximal esophagus was then examined which did show       evidence of esophagitis likely from food bolus. No perforation or       bleeding.      Evidence of a gastric bypass was found. A gastric pouch with a normal       size was found  containing a bezoar. The staple line appeared intact. The       gastrojejunal anastomosis was characterized by healthy appearing mucosa.       This was traversed. The pouch-to-jejunum limb was characterized by       healthy appearing mucosa. The jejunojejunal anastomosis was       characterized by healthy appearing mucosa. The duodenum-to-jejunum limb       was not examined as it could not be reached. Impression:               - Food in the upper third of the esophagus.                           - Gastric bypass with a normal-sized pouch and                            intact staple line. Gastrojejunal anastomosis                            characterized by healthy appearing mucosa.                           - No specimens collected. Moderate Sedation:      Per Anesthesia Care Recommendation:           - Patient has a contact number available for                            emergencies. The signs and symptoms of potential                            delayed complications were discussed with the                            patient. Return to normal activities tomorrow.                            Written discharge instructions were provided to the                            patient.                           - Resume previous diet.                           - Use a proton pump inhibitor PO BID for 8 weeks.                           - Mechanical soft diet.                           -  Return to GI clinic in 3 weeks. Procedure Code(s):        --- Professional ---                           [phone], Esophagogastroduodenoscopy, flexible,                            transoral; diagnostic, including collection of                            specimen(s) by brushing or washing, when performed                            (separate procedure) Diagnosis Code(s):        --- Professional ---                           W43.154M, Food in esophagus causing other injury,                            initial encounter                            Z98.84, Bariatric surgery status                           T18.108A, Unspecified foreign body in esophagus                            causing other injury, initial encounter CPT copyright 2019 [employer]. All rights reserved. The codes documented in this report are preliminary and upon coder review may  be revised to meet current compliance requirements. Elon Alas. Abbey Chatters, DO [name], DO 10/17/2020 11:51:39 PM This report has been signed electronically. Number of Addenda: 0

## 2020-10-18 DIAGNOSIS — T18128A Food in esophagus causing other injury, initial encounter: Secondary | ICD-10-CM | POA: Diagnosis not present

## 2020-10-18 NOTE — ED Provider Notes (Signed)
[facility] Provider Note   CSN: [id number] Arrival date & time: 10/17/20  1901     History Chief Complaint  Patient presents with  . Foreign Body    Ellen Anderson is a 74 y.o. female who presents with concern for chicken bone stuck in her throat x2 to 3 hours.  She reports she was eating dinner, swallowed a piece of chicken and felt like something was stuck in her throat.  This sensation did not resolve after drinking fluids, became difficult for her to swallow liquids, so she presented to the emergency department.  She denies difficulty breathing, difficulty speaking.  Endorses pain with swallowing. States she had a similar episode in May 2021.  Personally reviewed this patient's medical records.  She has history of chronic back pain, hypokalemia, osteoporosis.  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  HPI     Past Medical History:  Diagnosis Date  . Anemia   . Chronic low back pain   . Degenerative joint disease   . Hip fracture, left ([place]) 03/12/2012  . Hypertension   . PONV (postoperative nausea and vomiting)     Patient Active Problem List   Diagnosis Date Noted  . S/P ORIF right IM nail hip 03/08/13 03/21/2018  . Constipation 07/06/2016  . Special screening for malignant neoplasms, colon   . Dysphagia 02/07/2016  . Encounter for screening colonoscopy 02/07/2016  . Chronic pain syndrome 08/15/2013  . Bursitis of left hip 05/30/2013  . Intertrochanteric fracture of right hip ([place]) s/p IM nail 03/08/13 04/27/2013  . Hip fracture, right ([place]) 03/05/2013  . Hypokalemia 03/05/2013  . Hip fracture, left ([place]) 03/12/2012  . Seizure ([place]) 12/26/2011  . UTI (lower urinary tract infection) 12/26/2011  . Anemia 12/26/2011  . Back pain, chronic 12/26/2011    Past Surgical History:  Procedure Laterality Date  . ABDOMINAL HYSTERECTOMY    . ANKLE SURGERY  Left    pins and screws  . APPENDECTOMY    . BACK SURGERY    . BIOPSY  05/20/2020   Procedure: BIOPSY;  Surgeon: Daneil Dolin, MD;  [facility]: [facility];  Service: Endoscopy;;  . CATARACT EXTRACTION W/PHACO Right 02/18/2015   Procedure: CATARACT EXTRACTION PHACO AND INTRAOCULAR LENS PLACEMENT RIGHT EYE; CDE:  10.48;  Surgeon: Tonny Branch, MD;  Location: [facility];  Service: Ophthalmology;  Laterality: Right;  . CATARACT EXTRACTION W/PHACO Left 03/07/2015   Procedure: CATARACT EXTRACTION PHACO AND INTRAOCULAR LENS PLACEMENT (IOC);  Surgeon: Tonny Branch, MD;  Location: [facility];  Service: Ophthalmology;  Laterality: Left;  CDE:7.14  . CHOLECYSTECTOMY    . COLONOSCOPY N/A 03/04/2016   Procedure: COLONOSCOPY;  Surgeon: Danie Binder, MD;  Location: [facility];  Service: Endoscopy;  Laterality: N/A;  930   . ESOPHAGOGASTRODUODENOSCOPY  07/23/2004   Dr. Rehman:foreign body impactedat cervical esophagus PORK CHOP, dilation of esophageal web  . ESOPHAGOGASTRODUODENOSCOPY  2011   Dr. Oneida Alar: food bolus which would not pass through gastric remnant into jejunostomy, PORK CHOP removed. AVOID PORK CHOPS  . ESOPHAGOGASTRODUODENOSCOPY N/A 03/04/2016   Procedure: ESOPHAGOGASTRODUODENOSCOPY (EGD);  Surgeon: Danie Binder, MD;  [facility];  Service: Endoscopy;  Laterality: N/A;  . ESOPHAGOGASTRODUODENOSCOPY (EGD) WITH PROPOFOL N/A 05/23/2019   Procedure: ESOPHAGOGASTRODUODENOSCOPY (EGD) WITH PROPOFOL;  Surgeon: Danie Binder, MD;  [facility];  Service: Endoscopy;  Laterality: N/A;  1:45pm  . ESOPHAGOGASTRODUODENOSCOPY (EGD) WITH PROPOFOL N/A 05/20/2020   Procedure: ESOPHAGOGASTRODUODENOSCOPY (EGD) WITH PROPOFOL;  Surgeon: Daneil Dolin, MD;  [facility];  Service: Endoscopy;  Laterality: N/A;  2:30pm  . GASTRIC BYPASS  1981  . HIP SURGERY Bilateral    fractured bilateral hips  . INTRAMEDULLARY (IM) NAIL INTERTROCHANTERIC Right 03/08/2013   Procedure: INTRAMEDULLARY (IM)  NAIL INTERTROCHANTRIC;  Surgeon: Carole Civil, MD;  Location: [facility];  Service: Orthopedics;  Laterality: Right;  . MALONEY DILATION N/A 05/20/2020   Procedure: Venia Minks DILATION;  Surgeon: Daneil Dolin, MD;  [facility]: [facility];  Service: Endoscopy;  Laterality: N/A;  . SAVORY DILATION N/A 03/04/2016   Procedure: SAVORY DILATION;  Surgeon: Danie Binder, MD;  [facility];  Service: Endoscopy;  Laterality: N/A;  . SAVORY DILATION N/A 05/23/2019   Procedure: SAVORY DILATION;  Surgeon: Danie Binder, MD;  [facility];  Service: Endoscopy;  Laterality: N/A;     OB History   No obstetric history on file.     Family History  Problem Relation Age of Onset  . Throat cancer Mother   . COPD Father   . Stomach cancer Sister   . COPD Sister   . Colon polyps Sister   . COPD Sister   . Colon cancer Neg Hx   . Esophageal cancer Neg Hx     Social History   Tobacco Use  . Smoking status: Never Smoker  . Smokeless tobacco: Never Used  Vaping Use  . Vaping Use: Never used  Substance Use Topics  . Alcohol use: No    Alcohol/week: 0.0 standard drinks  . Drug use: No    Home Medications Prior to Admission medications   Medication Sig Start Date End Date Taking? Authorizing Provider  Ascorbic Acid (VITAMIN C WITH ROSE HIPS) 1000 MG tablet Take 1,000 mg by mouth daily.   Yes [provider]  aspirin 81 MG EC tablet Take 81 mg by mouth daily.    Yes [provider]  denosumab (PROLIA) 60 MG/ML SOLN injection Inject 60 mg into the skin every 6 (six) months. Administer in upper arm, thigh, or abdomen   Yes [provider]  diphenhydrAMINE (BENADRYL) 25 mg capsule Take 75 mg by mouth every 6 (six) hours as needed for itching. Takes 3 before and 3 after taking taking Oxycodone    Yes [provider]  magnesium oxide (MAG-OX) 400 MG tablet Take 400 mg by mouth daily.   Yes [provider]  Oxycodone HCl 10 MG TABS Take  10 mg by mouth 3 (three) times daily as needed. 03/09/20  Yes [provider]  promethazine (PHENERGAN) 12.5 MG tablet Take 1 tablet (12.5 mg total) by mouth every 6 (six) hours as needed for nausea or vomiting. 07/03/19  Yes Carole Civil, MD  Propylene Glycol (SYSTANE BALANCE) 0.6 % SOLN Place 1 drop into both eyes daily as needed (dry eyes).   Yes [provider]  lubiprostone (AMITIZA) 24 MCG capsule Take 1 capsule (24 mcg total) by mouth 2 (two) times daily with a meal. 05/21/20   Jodi Mourning, Tivis Ringer, PA-C  omeprazole (PRILOSEC) 20 MG capsule Take 1 capsule (20 mg total) by mouth 2 (two) times daily before a meal. 10/17/20 04/15/21  Eloise Harman, DO    Allergies    Amoxicillin-pot clavulanate, Ceclor [cefaclor], Morphine and related, Sulfur, Azithromycin, Flexeril [cyclobenzaprine], Ivp dye [iodinated diagnostic agents], Methadone, Percocet [oxycodone-acetaminophen], Betadine [  povidone iodine], Codeine, Escitalopram oxalate, Ibuprofen, and Tylenol [acetaminophen]  Review of Systems   Review of Systems  Constitutional: Negative.   HENT: Positive for trouble swallowing. Negative for sore throat.        Sensation of something sharp lodged in her throat, concern for chicken bone as this started while she was eating dinner.  Respiratory: Negative for cough, chest tightness, shortness of breath, wheezing and stridor.   Cardiovascular: Negative.  Negative for chest pain and palpitations.  Gastrointestinal: Negative.   Genitourinary: Negative.   Musculoskeletal: Positive for neck pain.       Tenderness to palpation over left lateral neck.  Skin: Negative.   Neurological: Negative for dizziness, syncope, speech difficulty and weakness.  Hematological: Negative.   Psychiatric/Behavioral: Negative.     Physical Exam Updated Vital Signs BP 117/60   Pulse 71   Temp 98 F (36.7 C) (Oral)   Resp 17   Ht 5\' 2"  (1.575 m)   Wt 69.1 kg   SpO2 100%   BMI 27.87 kg/m    Physical Exam Vitals and nursing note reviewed.  HENT:     Head: Normocephalic and atraumatic.     Nose: Nose normal.     Mouth/Throat:     Mouth: Mucous membranes are moist.     Pharynx: Oropharynx is clear. Uvula midline. No oropharyngeal exudate or posterior oropharyngeal erythema.     Tonsils: No tonsillar exudate.  Eyes:     General:        Right eye: No discharge.        Left eye: No discharge.     Conjunctiva/sclera: Conjunctivae normal.     Pupils: Pupils are equal, round, and reactive to light.  Neck:     Trachea: Trachea and phonation normal.   Cardiovascular:     Rate and Rhythm: Normal rate and regular rhythm.     Pulses: Normal pulses.          Carotid pulses are 2+ on the right side and 2+ on the left side.      Dorsalis pedis pulses are 2+ on the right side and 2+ on the left side.     Heart sounds: Normal heart sounds. No murmur heard.   Pulmonary:     Effort: Pulmonary effort is normal. No respiratory distress.     Breath sounds: Normal breath sounds. No wheezing or rales.  Chest:     Chest wall: No tenderness, crepitus or edema.  Abdominal:     General: There is no distension.     Palpations: Abdomen is soft.     Tenderness: There is no abdominal tenderness.  Musculoskeletal:        General: No deformity.     Cervical back: Neck supple. Edema present. No erythema, rigidity or crepitus. No pain with movement, spinous process tenderness or muscular tenderness.     Right lower leg: No edema.     Left lower leg: No edema.  Lymphadenopathy:     Cervical: No cervical adenopathy.  Skin:    General: Skin is warm and dry.  Neurological:     General: No focal deficit present.     Mental Status: She is alert and oriented to person, place, and time.  Psychiatric:        Mood and Affect: Mood normal.        Behavior: Behavior normal.     ED Results / Procedures / Treatments   Labs (all labs ordered are listed, but only abnormal  results are  displayed) Labs Reviewed  RESPIRATORY PANEL BY RT PCR (FLU A&B, COVID)    EKG None  Radiology DG Neck Soft Tissue  Result Date: 10/17/2020 CLINICAL DATA:  Foreign body.  Ingested chicken bone. EXAM: NECK SOFT TISSUES - 1+ VIEW COMPARISON:  None. FINDINGS: There is a curvilinear calcific density seen anterior to C7 with a small amount of subcutaneous gas noted within this region possibly representing a retained calcified foreign body. Less likely, this could represent senescent calcification of the posterior aspect of the cricoid cartilage. The adenoidal and tonsillar shadows are within normal limits. The hypopharynx is not well aerated, but is unremarkable. Epiglottic and aryepiglottic folds are unremarkable. Subglottic airway is widely patent. IMPRESSION: Possible retained foreign body within the a expected location of the proximal esophagus with a small amount of subcutaneous gas. This should be further assessed with dedicated CT imaging of the neck without contrast. Electronically Signed   By: Fidela Salisbury MD   On: 10/17/2020 20:45   CT Soft Tissue Neck Wo Contrast  Result Date: 10/17/2020 CLINICAL DATA:  74 year old female with suspected chicken bone stuck in throat for the past several hours. EXAM: CT NECK WITHOUT CONTRAST TECHNIQUE: Multidetector CT imaging of the neck was performed following the standard protocol without intravenous contrast. COMPARISON:  Neck radiographs today. FINDINGS: Pharynx and larynx: Laryngeal and pharyngeal soft tissue contours remain within normal limits. Negative noncontrast parapharyngeal and retropharyngeal spaces. No tracking soft tissue gas in the neck. Within the proximal aspect of the cervical esophagus curvilinear chicken bone and associated debris is lodged as suspected on the earlier radiographs. This is at the C6-C7 spinal level. See series 2, images 83-86 and coronal images 55 through 58. Length of the lodged chicken bone is up to 2.7 cm. The wall of  the esophagus is mildly stretched in this region but there is no extraluminal gas or fluid to indicate perforation. Distal to the foreign body the visible esophagus is normal. Salivary glands: Negative sublingual space, noncontrast submandibular and parotid glands. Thyroid: Negative. Lymph nodes: No cervical lymphadenopathy. Vascular: Vascular patency is not evaluated in the absence of IV contrast. Mild calcified atherosclerosis in the neck and at the skull base. Limited intracranial: Negative. Visualized orbits: Postoperative changes to both globes, otherwise negative. Mastoids and visualized paranasal sinuses: Chronic right maxillary sinusitis with mucoperiosteal thickening. Otherwise well aerated. Skeleton: Osteopenia. Absent dentition. Benign vertebral hemangioma at C6. No acute osseous abnormality identified. Upper chest: Negative. IMPRESSION: 1. Confirmed retained curvilinear chicken bone (up to 2.7 cm in length) and debris lodged in the proximal cervical esophagus corresponding to the C6-C7 level. No evidence of esophageal perforation. 2. Otherwise negative noncontrast Neck CT; chronic right maxillary sinusitis. Electronically Signed   By: Genevie Ann M.D.   On: 10/17/2020 21:33    Procedures Procedures (including critical care time)  Medications Ordered in ED Medications  fentaNYL (SUBLIMAZE) injection 50 mcg (50 mcg Intravenous Given 10/17/20 2227)  ondansetron (ZOFRAN) injection 4 mg (4 mg Intravenous Given 10/17/20 2227)  clindamycin (CLEOCIN) IVPB 600 mg (600 mg Intravenous New Bag/Given 10/17/20 2227)    ED Course  I have reviewed the triage vital signs and the nursing notes.  Pertinent labs & imaging results that were available during my care of the patient were reviewed by me and considered in my medical decision making (see chart for details).    MDM Rules/Calculators/A&P  74 year old female presents with globus sensation in her throat, concern for chicken bone  as she was eating chicken at dinner when the sensation began.   Vital signs normal on intake.  Patient is without respiratory distress.    X-ray of the soft tissues of the neck ordered in triage, which found possible retained foreign body within the expected location of the proximal esophagus with a small amount of subcutaneous gas.  CT of the soft tissues of the neck confirmed retained curvilinear chicken bone (up to 2.7 cm in length) and debris lodged in the proximal cervical esophagus corresponding to the C6-C7 level.  No evidence of esophageal perforation.  Physical exam concerning for some left-sided neck swelling, tenderness to palpation.  Cardiopulmonary exam is normal. Patient complaining of pain.  Analgesia offered, antiemetic.  Will administer IV antibiotic therapy, to cover for possible soft tissue infection of the neck.  Gastroenterology consult placed to Dr. Abbey Chatters, who is amenable to assuming care of this patient and plans to take her to the endoscopy suite tonight for removal of foreign body.  I appreciate his collaboration in the care of this patient.  Respiratory pathogen panel negative for COVID-19, influenza A/B.  Final Clinical Impression(s) / ED Diagnoses Final diagnoses:  None    Rx / DC Orders ED Discharge Orders         Ordered    omeprazole (PRILOSEC) 20 MG capsule  2 times daily before meals        10/17/20 2354           Mohammedali Bedoy, Gypsy Balsam, PA-C 10/18/20 0031    Margette Fast, MD 10/18/20 [phone]

## 2020-10-18 NOTE — Transfer of Care (Signed)
Immediate Anesthesia Transfer of Care Note  Patient: Ellen Anderson  Procedure(s) Performed: ESOPHAGOGASTRODUODENOSCOPY (EGD) WITH PROPOFOL - RETRIEVAL OF FOREIGN BODY ESOPHAGUS (N/A )  Patient Location: PACU  Anesthesia Type:General  Level of Consciousness: awake, alert  and oriented  Airway & Oxygen Therapy: Patient Spontanous Breathing  Post-op Assessment: Report given to RN and Post -op Vital signs reviewed and stable  Post vital signs: Reviewed and stable  Last Vitals:  Vitals Value Taken Time  BP 90/63 10/18/20 0000  Temp 36.7 C 10/17/20 2355  Pulse 73 10/17/20 2355  Resp 23 10/17/20 2355  SpO2 99 % 10/18/20 0000    Last Pain:  Vitals:   10/17/20 2355  TempSrc:   PainSc: 0-No pain         Complications: No complications documented.

## 2020-10-18 NOTE — Anesthesia Procedure Notes (Signed)
Procedure Name: Intubation Date/Time: 10/17/2020 11:37 PM Performed by: Denese Killings, MD Pre-anesthesia Checklist: Patient identified, Emergency Drugs available, Suction available and Patient being monitored Patient Re-evaluated:Patient Re-evaluated prior to induction Oxygen Delivery Method: Circle system utilized Preoxygenation: Pre-oxygenation with 100% oxygen Induction Type: IV induction and Rapid sequence Laryngoscope Size: Mac and 3 Grade View: Grade I Tube type: Oral Tube size: 7.0 mm Number of attempts: 1 Airway Equipment and Method: Stylet and Oral airway Placement Confirmation: ETT inserted through vocal cords under direct vision,  positive ETCO2 and breath sounds checked- equal and bilateral Secured at: 21 cm Tube secured with: Tape Dental Injury: Teeth and Oropharynx as per pre-operative assessment

## 2020-10-18 NOTE — Anesthesia Postprocedure Evaluation (Signed)
Anesthesia Post Note  Patient: Ellen Anderson  Procedure(s) Performed: ESOPHAGOGASTRODUODENOSCOPY (EGD) WITH PROPOFOL - RETRIEVAL OF FOREIGN BODY ESOPHAGUS (N/A )  Patient location during evaluation: PACU Anesthesia Type: General Level of consciousness: awake and alert and oriented Pain management: pain level controlled Vital Signs Assessment: post-procedure vital signs reviewed and stable Respiratory status: spontaneous breathing and respiratory function stable Postop Assessment: no apparent nausea or vomiting Anesthetic complications: no   No complications documented.   Last Vitals:  Vitals:   10/17/20 2355 10/18/20 0000  BP: (!) 110/92 90/63  Pulse: 73   Resp: (!) 23   Temp: 36.7 C   SpO2: 98% 99%    Last Pain:  Vitals:   10/17/20 2355  TempSrc:   PainSc: 0-No pain                 Jaedyn Marrufo C Abril Cappiello

## 2020-10-24 ENCOUNTER — Encounter (HOSPITAL_COMMUNITY): Payer: Self-pay | Admitting: Internal Medicine

## 2020-11-19 ENCOUNTER — Ambulatory Visit: Payer: Medicare Other | Admitting: Nurse Practitioner

## 2020-11-19 NOTE — Progress Notes (Deleted)
Referring Provider: Jani Gravel, MD Primary Care Physician:  Jani Gravel, MD Primary GI:  Dr. Abbey Chatters  No chief complaint on file.   HPI:   Ellen Anderson is a 74 y.o. female who presents for follow-up.  The patient was last seen in our office 02/13/2020 for constipation and dysphagia.  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  On PPI for GERD management.  Constipation generally well managed with Amitiza 8 mcg twice daily with occasional breakthrough.  At her last visit noted recurrent/persistent dysphagia is getting progressively worse for the past 2 months including solid food and pill dysphagia.  Constipation getting worse, only on pain medication 2-3 times a week.  On Amitiza 8 mcg twice daily and Colace once daily with small, hard stools.  Admits she is not following gastric bypass diet, minimal to no water, minimal fruits and vegetables.  Needs a laxative or suppository about every 10 days.  No other overt GI complaints.  Recommended increasing Amitiza 24 mcg twice daily with progress report 1 to 2 weeks, barium pill esophagram, referral to ENT for patient's concerns about tonsils, follow-up in 2 months.  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  Minimal GERD.  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  Recommended EGD with possible dilation but the patient elected to hold off until she saw ENT.  She did eventually acquiesced to undergoing EGD.  EGD completed 05/20/2020 found patent tubular esophagus status post dilation, single island of salmon-colored epithelium status post biopsy, surgically altered stomach consistent with prior  gastric bypass history.  Surgical pathology found the biopsies to be consistent with Barrett's esophagus negative for dysplasia.  Recommended continue omeprazole 20 mg daily and follow-up in 6 months.  The patient was in the emergency department 10/17/2020 with complaints of solid food impaction specifically chicken bone stuck in her throat for 2 to 3 hours.  EGD completed 10/17/2020 which found food in the upper third of the esophagus status post removal with Raptor tooth grasper.  Status post removal noted consistence with esophagitis likely from food bolus.  Noted gastric bypass with normal-sized pouch and intact staple line.  Recommended PPI twice daily for 8 weeks, mechanical soft diet, follow-up in 3 weeks.  Today she states she doing okay overall.  Past Medical History:  Diagnosis Date  . Anemia   . Chronic low back pain   . Degenerative joint disease   . Hip fracture, left ([place]) 03/12/2012  . Hypertension   . PONV (postoperative nausea and vomiting)     Past Surgical History:  Procedure Laterality Date  . ABDOMINAL HYSTERECTOMY    . ANKLE SURGERY Left    pins and screws  . APPENDECTOMY    . BACK SURGERY    . BIOPSY  05/20/2020   Procedure: BIOPSY;  Surgeon: Daneil Dolin, MD;  [facility];  Service: Endoscopy;;  . CATARACT EXTRACTION W/PHACO Right 02/18/2015   Procedure: CATARACT EXTRACTION PHACO AND INTRAOCULAR LENS PLACEMENT RIGHT EYE; CDE:  10.48;  Surgeon: Tonny Branch, MD;  Location: [facility];  Service: Ophthalmology;  Laterality: Right;  . CATARACT EXTRACTION W/PHACO Left 03/07/2015   Procedure: CATARACT EXTRACTION PHACO AND INTRAOCULAR LENS PLACEMENT (  [place]);  Surgeon: Tonny Branch, MD;  Location: [facility];  Service: Ophthalmology;  Laterality: Left;  CDE:7.14  . CHOLECYSTECTOMY    . COLONOSCOPY N/A 03/04/2016   Procedure: COLONOSCOPY;  Surgeon: Danie Binder, MD;  [facility];  Service: Endoscopy;  Laterality: N/A;  930   . ESOPHAGOGASTRODUODENOSCOPY   07/23/2004   Dr. Rehman:foreign body impactedat cervical esophagus PORK CHOP, dilation of esophageal web  . ESOPHAGOGASTRODUODENOSCOPY  2011   Dr. Oneida Alar: food bolus which would not pass through gastric remnant into jejunostomy, PORK CHOP removed. AVOID PORK CHOPS  . ESOPHAGOGASTRODUODENOSCOPY N/A 03/04/2016   Procedure: ESOPHAGOGASTRODUODENOSCOPY (EGD);  Surgeon: Danie Binder, MD;  [facility];  Service: Endoscopy;  Laterality: N/A;  . ESOPHAGOGASTRODUODENOSCOPY (EGD) WITH PROPOFOL N/A 05/23/2019   Procedure: ESOPHAGOGASTRODUODENOSCOPY (EGD) WITH PROPOFOL;  Surgeon: Danie Binder, MD;  [facility];  Service: Endoscopy;  Laterality: N/A;  1:45pm  . ESOPHAGOGASTRODUODENOSCOPY (EGD) WITH PROPOFOL N/A 05/20/2020   Procedure: ESOPHAGOGASTRODUODENOSCOPY (EGD) WITH PROPOFOL;  Surgeon: Daneil Dolin, MD;  [facility];  Service: Endoscopy;  Laterality: N/A;  2:30pm  . ESOPHAGOGASTRODUODENOSCOPY (EGD) WITH PROPOFOL N/A 10/17/2020   Procedure: ESOPHAGOGASTRODUODENOSCOPY (EGD) WITH PROPOFOL - RETRIEVAL OF FOREIGN BODY ESOPHAGUS;  Surgeon: Eloise Harman, DO;  [facility];  Service: Endoscopy;  Laterality: N/A;  . GASTRIC BYPASS  1981  . HIP SURGERY Bilateral    fractured bilateral hips  . INTRAMEDULLARY (IM) NAIL INTERTROCHANTERIC Right 03/08/2013   Procedure: INTRAMEDULLARY (IM) NAIL INTERTROCHANTRIC;  Surgeon: Carole Civil, MD;  Location: [facility];  Service: Orthopedics;  Laterality: Right;  . MALONEY DILATION N/A 05/20/2020   Procedure: Venia Minks DILATION;  Surgeon: Daneil Dolin, MD;  [facility];  Service: Endoscopy;  Laterality: N/A;  . SAVORY DILATION N/A 03/04/2016   Procedure: SAVORY DILATION;  Surgeon: Danie Binder, MD;  [facility];  Service: Endoscopy;  Laterality: N/A;  . SAVORY DILATION N/A 05/23/2019   Procedure: SAVORY DILATION;  Surgeon: Danie Binder, MD;  [facility];  Service: Endoscopy;   Laterality: N/A;    Current Outpatient Medications  Medication Sig Dispense Refill  . Ascorbic Acid (VITAMIN C WITH ROSE HIPS) 1000 MG tablet Take 1,000 mg by mouth daily.    Marland Kitchen aspirin 81 MG EC tablet Take 81 mg by mouth daily.     Marland Kitchen denosumab (PROLIA) 60 MG/ML SOLN injection Inject 60 mg into the skin every 6 (six) months. Administer in upper arm, thigh, or abdomen    . diphenhydrAMINE (BENADRYL) 25 mg capsule Take 75 mg by mouth every 6 (six) hours as needed for itching. Takes 3 before and 3 after taking taking Oxycodone     . lubiprostone (AMITIZA) 24 MCG capsule Take 1 capsule (24 mcg total) by mouth 2 (two) times daily with a meal. 60 capsule 5  . magnesium oxide (MAG-OX) 400 MG tablet Take 400 mg by mouth daily.    Marland Kitchen omeprazole (PRILOSEC) 20 MG capsule Take 1 capsule (20 mg total) by mouth 2 (two) times daily before a meal. 180 capsule 1  . Oxycodone HCl 10 MG TABS Take 10 mg by mouth 3 (three) times daily as needed.    . promethazine (PHENERGAN) 12.5 MG tablet Take 1 tablet (12.5 mg total) by mouth every 6 (six) hours as needed for nausea or vomiting. 30 tablet 0  . Propylene Glycol (SYSTANE BALANCE) 0.6 % SOLN Place 1 drop into both eyes daily  as needed (dry eyes).     No current facility-administered medications for this visit.    Allergies as of 11/19/2020 - Review Complete 10/17/2020  Allergen Reaction Noted  . Amoxicillin-pot clavulanate Swelling 12/25/2011  . Ceclor [cefaclor] Swelling 12/25/2011  . Morphine and related Other (See Comments) 12/25/2011  . Sulfur Shortness Of Breath 12/25/2011  . Azithromycin Other (See Comments) 12/26/2011  . Flexeril [cyclobenzaprine] Hives and Swelling 02/20/2016  . Ivp dye [iodinated diagnostic agents] Other (See Comments) 12/25/2011  . Methadone Other (See Comments) 12/25/2011  . Percocet [oxycodone-acetaminophen] Itching 12/25/2011  . Betadine [povidone iodine] Rash 02/18/2015  . Codeine Hives and Rash 12/25/2011  . Escitalopram  oxalate Other (See Comments) 12/25/2011  . Ibuprofen Hives and Rash 12/26/2011  . Tylenol [acetaminophen] Hives and Rash 12/26/2011    Family History  Problem Relation Age of Onset  . Throat cancer Mother   . COPD Father   . Stomach cancer Sister   . COPD Sister   . Colon polyps Sister   . COPD Sister   . Colon cancer Neg Hx   . Esophageal cancer Neg Hx     Social History   Socioeconomic History  . Marital status: Widowed    Spouse name: Not on file  . Number of children: Not on file  . Years of education: Not on file  . Highest education level: Not on file  Occupational History  . Not on file  Tobacco Use  . Smoking status: Never Smoker  . Smokeless tobacco: Never Used  Vaping Use  . Vaping Use: Never used  Substance and Sexual Activity  . Alcohol use: No    Alcohol/week: 0.0 standard drinks  . Drug use: No  . Sexual activity: Never    Birth control/protection: Surgical  Other Topics Concern  . Not on file  Social History Narrative  . Not on file   Social Determinants of Health   Financial Resource Strain: Not on file  Food Insecurity: Not on file  Transportation Needs: Not on file  Physical Activity: Not on file  Stress: Not on file  Social Connections: Not on file    Subjective:*** Review of Systems  Constitutional: Negative for chills, fever, malaise/fatigue and weight loss.  HENT: Negative for congestion and sore throat.   Respiratory: Negative for cough and shortness of breath.   Cardiovascular: Negative for chest pain and palpitations.  Gastrointestinal: Negative for abdominal pain, blood in stool, diarrhea, melena, nausea and vomiting.  Musculoskeletal: Negative for joint pain and myalgias.  Skin: Negative for rash.  Neurological: Negative for dizziness and weakness.  Endo/Heme/Allergies: Does not bruise/bleed easily.  Psychiatric/Behavioral: Negative for depression. The patient is not nervous/anxious.   All other systems reviewed and are  negative.    Objective: There were no vitals taken for this visit. Physical Exam Vitals and nursing note reviewed.  Constitutional:      General: She is not in acute distress.    Appearance: Normal appearance. She is well-developed. She is not ill-appearing, toxic-appearing or diaphoretic.  HENT:     Head: Normocephalic and atraumatic.     Nose: No congestion or rhinorrhea.  Eyes:     General: No scleral icterus. Cardiovascular:     Rate and Rhythm: Normal rate and regular rhythm.     Heart sounds: Normal heart sounds.  Pulmonary:     Effort: Pulmonary effort is normal. No respiratory distress.     Breath sounds: Normal breath sounds.  Abdominal:     General: Bowel  sounds are normal.     Palpations: Abdomen is soft. There is no hepatomegaly, splenomegaly or mass.     Tenderness: There is no abdominal tenderness. There is no guarding or rebound.     Hernia: No hernia is present.  Skin:    General: Skin is warm and dry.     Coloration: Skin is not jaundiced.     Findings: No rash.  Neurological:     General: No focal deficit present.     Mental Status: She is alert and oriented to person, place, and time.  Psychiatric:        Attention and Perception: Attention normal.        Mood and Affect: Mood normal.        Speech: Speech normal.        Behavior: Behavior normal.        Thought Content: Thought content normal.        Cognition and Memory: Cognition and memory normal.      Assessment:  ***   Plan: ***    Thank you for allowing [place] to participate in the care of Suszanne Conners, DNP, AGNP-C Adult & Gerontological Nurse Practitioner [facility]   11/19/2020 7:50 AM   Disclaimer: This note was dictated with voice recognition software. Similar sounding words can inadvertently be transcribed and may not be corrected upon review.

## 2021-01-29 ENCOUNTER — Other Ambulatory Visit: Payer: Self-pay

## 2021-01-29 DIAGNOSIS — K5900 Constipation, unspecified: Secondary | ICD-10-CM

## 2021-01-29 MED ORDER — LUBIPROSTONE 24 MCG PO CAPS: 24.0000 ug | ORAL_CAPSULE | Freq: Two times a day (BID) | ORAL | 5 refills | Status: DC

## 2021-02-04 ENCOUNTER — Telehealth: Payer: Self-pay

## 2021-02-04 DIAGNOSIS — K5900 Constipation, unspecified: Secondary | ICD-10-CM

## 2021-02-04 NOTE — Telephone Encounter (Signed)
Ellen Anderson This pt needs a PA for Lubiprostone 24MCG cap. Her last ov was on 02/13/2020 she was suppose to follow-up in 2 months. Well this pt doesn't need a PA for Linzess, Movantik, or Trulance. Which according to your last note she was to already triple the amount of Lubiprostone she was on. On 07/04/2020 she picked up Rincon and never reported to [place] how they worked. It will be hard to get approved with the only note backing [place] is that she had to triple what she was taking. Please advise what you want me to do regarding this.

## 2021-02-10 ENCOUNTER — Encounter: Payer: Self-pay | Admitting: Internal Medicine

## 2021-02-12 NOTE — Telephone Encounter (Signed)
Please ask her if the Linzess worked at all. Explain that they will likely want her to Try other covered options (appears they covered Amitiza last year but not this year?). We can try a higher dose of Linzess if the 145 dose wasn't effective. Can also try Trulance 3 mg.  Let me know what she would prefer to do.

## 2021-02-13 MED ORDER — TRULANCE 3 MG PO TABS: 1.0000 | ORAL_TABLET | Freq: Every day | ORAL | 2 refills | Status: DC

## 2021-02-13 NOTE — Telephone Encounter (Signed)
Rx for Trulance sent to her pharmacy

## 2021-02-13 NOTE — Addendum Note (Signed)
Addended by: Gordy Levan, Keoki Mchargue A on: 02/13/2021 04:53 PM   Modules accepted: Orders

## 2021-02-13 NOTE — Telephone Encounter (Signed)
Spoke with the pt she is refusing the Linzess because she said it broke her out with a rash all over. States to try and see if Trulance 3mg  works for her.

## 2021-02-13 NOTE — Telephone Encounter (Signed)
noted 

## 2021-02-14 NOTE — Telephone Encounter (Signed)
Phoned and advised the pt that Trulance was phoned to here pharmacy. Pt was advised by her pharmacists that they sent [place] a list of what her insurance covered and I advised her what they sent [place] and for the pt once she has tried the medication to call us with an update on how it works for her. The pt agreed.

## 2021-05-22 ENCOUNTER — Other Ambulatory Visit: Payer: Self-pay | Admitting: Nurse Practitioner

## 2021-05-22 DIAGNOSIS — K5900 Constipation, unspecified: Secondary | ICD-10-CM

## 2021-09-17 ENCOUNTER — Other Ambulatory Visit: Payer: Self-pay | Admitting: Gastroenterology

## 2021-09-17 ENCOUNTER — Encounter: Payer: Self-pay | Admitting: Internal Medicine

## 2021-09-17 DIAGNOSIS — K5900 Constipation, unspecified: Secondary | ICD-10-CM

## 2021-09-17 NOTE — Telephone Encounter (Signed)
Sending in 4 month supply of Trulance. Recommend OV for additional refills as she hasn't been seen since March 2021.

## 2021-09-17 NOTE — Telephone Encounter (Signed)
Spoke to pt. Informed her that prescription was sent to pharmacy and that she would need OV for additional refills. Pt. Voiced understanding.

## 2021-09-17 NOTE — Telephone Encounter (Signed)
Noted  

## 2021-10-22 ENCOUNTER — Ambulatory Visit (INDEPENDENT_AMBULATORY_CARE_PROVIDER_SITE_OTHER): Payer: Medicare Other | Admitting: Gastroenterology

## 2021-10-22 ENCOUNTER — Ambulatory Visit: Payer: Medicare Other | Admitting: Gastroenterology

## 2021-10-22 ENCOUNTER — Other Ambulatory Visit: Payer: Self-pay | Admitting: Internal Medicine

## 2021-10-22 ENCOUNTER — Telehealth: Payer: Self-pay | Admitting: *Deleted

## 2021-10-22 ENCOUNTER — Encounter: Payer: Self-pay | Admitting: Gastroenterology

## 2021-10-22 ENCOUNTER — Other Ambulatory Visit: Payer: Self-pay

## 2021-10-22 DIAGNOSIS — K5900 Constipation, unspecified: Secondary | ICD-10-CM

## 2021-10-22 DIAGNOSIS — Z860100 Personal history of colon polyps, unspecified: Secondary | ICD-10-CM | POA: Insufficient documentation

## 2021-10-22 DIAGNOSIS — R1310 Dysphagia, unspecified: Secondary | ICD-10-CM

## 2021-10-22 MED ORDER — OMEPRAZOLE 20 MG PO CPDR
DELAYED_RELEASE_CAPSULE | ORAL | 3 refills | Status: DC
Start: 2021-10-22 — End: 2023-01-04

## 2021-10-22 MED ORDER — PROMETHAZINE HCL 12.5 MG PO TABS
ORAL_TABLET | ORAL | 3 refills | Status: DC
Start: 2021-10-22 — End: 2022-03-23

## 2021-10-22 MED ORDER — PEG 3350-KCL-NA BICARB-NACL 420 G PO SOLR: ORAL | 0 refills | Status: DC

## 2021-10-22 NOTE — Patient Instructions (Signed)
We are arranging a colonoscopy, upper endoscopy, and dilation with Dr. Abbey Chatters in the near future!  I have refilled your medication for you.  We will see you in 1 year or sooner if needed!  Annitta Needs, PhD, ANP-BC [facility]

## 2021-10-22 NOTE — Telephone Encounter (Signed)
Pt consented to a telephone visit. °

## 2021-10-22 NOTE — Progress Notes (Signed)
Primary Care Physician:  Jani Gravel, MD  Primary GI: Dr. Abbey Chatters  Patient Location: Home   Provider Location: [facility] office   Reason for Visit: Follow-up for refills   Persons present on the virtual encounter, with roles: Patient and NP   Total time (minutes) spent on medical discussion: 10 minutes   Due to COVID-19, visit was conducted using virtual method.  Visit was requested by patient.  Virtual Visit via Telephone Note Due to COVID-19, visit is conducted virtually and was requested by patient.   I connected with Jeanella Flattery on 10/22/21 at  2:00 PM EST by telephone and verified that I am speaking with the correct person using two identifiers.   I discussed the limitations, risks, security and privacy concerns of performing an evaluation and management service by telephone and the availability of in person appointments. I also discussed with the patient that there may be a patient responsible charge related to this service. The patient expressed understanding and agreed to proceed.  Chief Complaint  Patient presents with   Gastroesophageal Reflux    Ok, wants refills on omeprazole and phenergan   Constipation    Wants trulance refill     History of Present Illness: Ellen Anderson 75 year old female with a history of GERD, Barrett's esophagus with EGD surveillance due in 2024, dysphagia with multiple prior dilations, and chronic constipation, presenting for refills.   Food impaction Nov 2021. Last EGD with dilation in June 2021. She has had multiple dilations in the past. Feels like she needs repeat dilation  Chronic GERD on omeprazole BID. Symptoms controlled.  Chronic constipation. Takes Trulance. Watches what she eats. No abdominal pain. Sometimes Trulance makes her nauseated. Will take phenergan, which works best for her. Takes maybe 3-4 times per month. Trulance for constipation. BM about 2-3 times per week. Feels productive. Denies bloating.   Past Medical  History:  Diagnosis Date   Anemia    Chronic low back pain    Degenerative joint disease    Hip fracture, left ([place]) 03/12/2012   Hypertension    PONV (postoperative nausea and vomiting)      Past Surgical History:  Procedure Laterality Date   ABDOMINAL HYSTERECTOMY     ANKLE SURGERY Left    pins and screws   APPENDECTOMY     BACK SURGERY     BIOPSY  05/20/2020   Procedure: BIOPSY;  Surgeon: Daneil Dolin, MD;  [facility];  Service: Endoscopy;;   CATARACT EXTRACTION W/PHACO Right 02/18/2015   Procedure: CATARACT EXTRACTION PHACO AND INTRAOCULAR LENS PLACEMENT RIGHT EYE; CDE:  10.48;  Surgeon: Tonny Branch, MD;  Location: [facility];  Service: Ophthalmology;  Laterality: Right;   CATARACT EXTRACTION W/PHACO Left 03/07/2015   Procedure: CATARACT EXTRACTION PHACO AND INTRAOCULAR LENS PLACEMENT ([facility]);  Surgeon: Tonny Branch, MD;  Location: [facility];  Service: Ophthalmology;  Laterality: Left;  CDE:7.14   CHOLECYSTECTOMY     COLONOSCOPY N/A 03/04/2016   three polyps, hemorrhoids. Tubular adenoma. Colonoscopy 2022.   ESOPHAGOGASTRODUODENOSCOPY  07/23/2004   Dr. 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 Oneida Alar: food bolus which would not pass through gastric remnant into jejunostomy, PORK CHOP removed. AVOID PORK CHOPS   ESOPHAGOGASTRODUODENOSCOPY N/A 03/04/2016   Procedure: ESOPHAGOGASTRODUODENOSCOPY (EGD);  Surgeon: Danie Binder, MD;  [facility];  Service: Endoscopy;  Laterality: N/A;   ESOPHAGOGASTRODUODENOSCOPY (EGD) WITH PROPOFOL N/A 05/23/2019  Procedure: ESOPHAGOGASTRODUODENOSCOPY (EGD) WITH PROPOFOL;  Surgeon: Danie Binder, MD;  [facility];  Service: Endoscopy;  Laterality: N/A;  1:45pm   ESOPHAGOGASTRODUODENOSCOPY (EGD) WITH PROPOFOL N/A 05/20/2020   patent tubular esophagus s/p dilation, single island of salmon-colored epithelium. Barrett's esophagus.  3 year surveillance.   ESOPHAGOGASTRODUODENOSCOPY (EGD) WITH PROPOFOL N/A 10/17/2020   food in upper third of esophagus, gastric bypass with normal pouch. GJ anastomosis normal appearing.   GASTRIC BYPASS  12/08/1979   HIP SURGERY Bilateral    fractured bilateral hips   INTRAMEDULLARY (IM) NAIL INTERTROCHANTERIC Right 03/08/2013   Procedure: INTRAMEDULLARY (IM) NAIL INTERTROCHANTRIC;  Surgeon: Carole Civil, MD;  [facility]: [facility];  Service: Orthopedics;  Laterality: Right;   MALONEY DILATION N/A 05/20/2020   Procedure: Venia Minks DILATION;  Surgeon: Daneil Dolin, MD;  [facility];  Service: Endoscopy;  Laterality: N/A;   SAVORY DILATION N/A 03/04/2016   Procedure: SAVORY DILATION;  Surgeon: Danie Binder, MD;  [facility];  Service: Endoscopy;  Laterality: N/A;   SAVORY DILATION N/A 05/23/2019   Procedure: SAVORY DILATION;  Surgeon: Danie Binder, MD;  [facility];  Service: Endoscopy;  Laterality: N/A;     Current Meds  Medication Sig   aspirin 81 MG EC tablet Take 81 mg by mouth daily.   denosumab (PROLIA) 60 MG/ML SOLN injection Inject 60 mg into the skin every 6 (six) months. Administer in upper arm, thigh, or abdomen   diphenhydrAMINE (BENADRYL) 25 mg capsule Take 75 mg by mouth every 6 (six) hours as needed for itching. Takes 3 before and 3 after taking taking Oxycodone    Oxycodone HCl 10 MG TABS Take 10 mg by mouth 3 (three) times daily as needed.   Propylene Glycol 0.6 % SOLN Place 1 drop into both eyes daily as needed (dry eyes).   TRULANCE 3 MG TABS TAKE 1 TABLET BY MOUTH DAILY   [DISCONTINUED] omeprazole (PRILOSEC) 20 MG capsule TAKE 1 CAPSULE(20 MG) BY MOUTH TWICE DAILY BEFORE A MEAL   [DISCONTINUED] promethazine (PHENERGAN) 12.5 MG tablet TAKE 1 TABLET BY MOUTH EVERY 6 TO 8 HOURS AS NEEDED FOR NAUSEA OR VOMITING     Family History  Problem Relation Age of Onset   Throat cancer Mother    COPD Father    Stomach cancer Sister     COPD Sister    Colon polyps Sister    COPD Sister    Colon cancer Neg Hx    Esophageal cancer Neg Hx     Social History   Socioeconomic History   Marital status: Widowed    Spouse name: Not on file   Number of children: Not on file   Years of education: Not on file   Highest education level: Not on file  Occupational History   Not on file  Tobacco Use   Smoking status: Never   Smokeless tobacco: Never  Vaping Use   Vaping Use: Never used  Substance and Sexual Activity   Alcohol use: No    Alcohol/week: 0.0 standard drinks   Drug use: No   Sexual activity: Never    Birth control/protection: Surgical  Other Topics Concern   Not on file  Social History Narrative   Not on file   Social Determinants of Health   Financial Resource Strain: Not on file  Food Insecurity: Not on file  Transportation Needs: Not on file  Physical Activity: Not on file  Stress: Not on file  Social Connections: Not on file       Review of Systems: Gen: Denies fever, chills, anorexia. Denies fatigue, weakness, weight loss.  CV: Denies chest pain, palpitations, syncope, peripheral edema, and claudication. Resp: Denies dyspnea at rest, cough, wheezing, coughing up blood, and pleurisy. GI: see HPI Derm: Denies rash, itching, dry skin Psych: Denies depression, anxiety, memory loss, confusion. No homicidal or suicidal ideation.  Heme: Denies bruising, bleeding, and enlarged lymph nodes.  Observations/Objective: No distress. Unable to perform physical exam due to telephone encounter. No video available.   Assessment and Plan: Ellen Anderson 75 year old female with a history of GERD, Barrett's esophagus with EGD surveillance due in 2024, dysphagia with multiple prior dilations, and chronic constipation, presenting for refills.   GERD is well-controlled on omeprazole BID.   She notes recurrent dysphagia, and she has had multiple dilations in the past. Last dilation in June 2021, and she actually had  a food impaction in Nov 2021. Requesting repeat dilation.   Constipation: taking Trulance daily  History of adenomas: due for surveillance now, as last colonoscopy was in 2017.  Will pursue TCS/EGD/dilation with Dr. Abbey Chatters in the near future.   If continues to do well post-procedure, recommend 1 year follow-up unless procedure findings dictate otherwise.     Follow Up Instructions:    I discussed the assessment and treatment plan with the patient. The patient was provided an opportunity to ask questions and all were answered. The patient agreed with the plan and demonstrated an understanding of the instructions.   The patient was advised to call back or seek an in-person evaluation if the symptoms worsen or if the condition fails to improve as anticipated.  I provided 10 minutes of telephone time during this telephone encounter.  Annitta Needs, PhD, ANP-BC [facility]

## 2021-10-22 NOTE — Telephone Encounter (Signed)
Jeanella Flattery, you are scheduled for a virtual visit with your provider today.  Just as we do with appointments in the office, we must obtain your consent to participate.  Your consent will be active for this visit and any virtual visit you may have with one of our providers in the next 365 days.  If you have a MyChart account, I can also send a copy of this consent to you electronically.  All virtual visits are billed to your insurance company just like a traditional visit in the office.  As this is a virtual visit, video technology does not allow for your provider to perform a traditional examination.  This may limit your provider's ability to fully assess your condition.  If your provider identifies any concerns that need to be evaluated in person or the need to arrange testing such as labs, EKG, etc, we will make arrangements to do so.  Although advances in technology are sophisticated, we cannot ensure that it will always work on either your end or our end.  If the connection with a video visit is poor, we may have to switch to a telephone visit.  With either a video or telephone visit, we are not always able to ensure that we have a secure connection.   I need to obtain your verbal consent now.   Are you willing to proceed with your visit today?

## 2021-10-22 NOTE — Progress Notes (Deleted)
EGD Nov 2021: food in upper third of esophagus, gastric bypass with normal pouch. GJ anastomosis normal appearing.   June 2021 EGD: patent tubular esophagus s/p dilation, single island of salmon-colored epithelium. Barrett's esophagus. 3 year surveillance.    Colonoscopy March 2017: three polyps, hemorrhoids. Tubular adenoma. Colonoscopy 2022.

## 2021-10-22 NOTE — Telephone Encounter (Signed)
Called pt. She has been scheduled for TCS/EGD/DIL with propofol, asa 2, [place] on 12/30 at 9:15am. Aware will mail prep instructions and send Rx to pharmacy.

## 2021-12-03 ENCOUNTER — Telehealth: Payer: Self-pay | Admitting: Internal Medicine

## 2021-12-03 NOTE — Telephone Encounter (Signed)
Called pt. She is scheduled for procedure Friday 12/30 and needs to reschedule. Not feeling well and has fever. Patient rescheduled to 1/24 at 9:15am. Aware will mail new prep instructions and will inform endo.

## 2021-12-03 NOTE — Telephone Encounter (Signed)
PATIENT CALLED AND SAID SHE HAS A PROCEDURE THIS WEEK AND SHE HAS HAD A FEVER TODAY AND CAN NOT GET IT DOWN

## 2021-12-30 ENCOUNTER — Ambulatory Visit (HOSPITAL_COMMUNITY): Payer: Medicare Other | Admitting: Anesthesiology

## 2021-12-30 ENCOUNTER — Ambulatory Visit (HOSPITAL_COMMUNITY)
Admission: RE | Admit: 2021-12-30 | Discharge: 2021-12-30 | Disposition: A | Discharge status: Home / Self Care | Payer: Medicare Other | Source: Ambulatory Visit | Attending: Internal Medicine | Admitting: Internal Medicine

## 2021-12-30 ENCOUNTER — Telehealth: Payer: Self-pay | Admitting: Internal Medicine

## 2021-12-30 ENCOUNTER — Other Ambulatory Visit: Payer: Self-pay

## 2021-12-30 ENCOUNTER — Encounter (HOSPITAL_COMMUNITY): Payer: Self-pay

## 2021-12-30 ENCOUNTER — Encounter (HOSPITAL_COMMUNITY)
Admission: RE | Disposition: A | Discharge status: Home / Self Care | Payer: Self-pay | Source: Ambulatory Visit | Attending: Internal Medicine

## 2021-12-30 DIAGNOSIS — Z79899 Other long term (current) drug therapy: Secondary | ICD-10-CM | POA: Insufficient documentation

## 2021-12-30 DIAGNOSIS — K648 Other hemorrhoids: Secondary | ICD-10-CM | POA: Diagnosis not present

## 2021-12-30 DIAGNOSIS — Z538 Procedure and treatment not carried out for other reasons: Secondary | ICD-10-CM | POA: Diagnosis not present

## 2021-12-30 DIAGNOSIS — I10 Essential (primary) hypertension: Secondary | ICD-10-CM | POA: Diagnosis not present

## 2021-12-30 DIAGNOSIS — R1310 Dysphagia, unspecified: Secondary | ICD-10-CM | POA: Insufficient documentation

## 2021-12-30 DIAGNOSIS — Z86010 Personal history of colonic polyps: Secondary | ICD-10-CM | POA: Insufficient documentation

## 2021-12-30 DIAGNOSIS — Z9884 Bariatric surgery status: Secondary | ICD-10-CM | POA: Diagnosis not present

## 2021-12-30 DIAGNOSIS — G40909 Epilepsy, unspecified, not intractable, without status epilepticus: Secondary | ICD-10-CM | POA: Diagnosis not present

## 2021-12-30 HISTORY — PX: ESOPHAGOGASTRODUODENOSCOPY (EGD) WITH PROPOFOL: SHX5813

## 2021-12-30 HISTORY — PX: BALLOON DILATION: SHX5330

## 2021-12-30 HISTORY — PX: FLEXIBLE SIGMOIDOSCOPY: SHX5431

## 2021-12-30 SURGERY — ESOPHAGOGASTRODUODENOSCOPY (EGD) WITH PROPOFOL
Anesthesia: General

## 2021-12-30 MED ORDER — PHENYLEPHRINE 40 MCG/ML (10ML) SYRINGE FOR IV PUSH (FOR BLOOD PRESSURE SUPPORT)
PREFILLED_SYRINGE | INTRAVENOUS | Status: DC | PRN
  Administered 2021-12-30: 10:00:00 120 ug via INTRAVENOUS
  Administered 2021-12-30: 10:00:00 80 ug via INTRAVENOUS

## 2021-12-30 MED ORDER — PROPOFOL 10 MG/ML IV BOLUS
INTRAVENOUS | Status: DC | PRN
Start: 2021-12-30 — End: 2021-12-30
  Administered 2021-12-30: 30 mg via INTRAVENOUS
  Administered 2021-12-30: 70 mg via INTRAVENOUS

## 2021-12-30 MED ORDER — PHENYLEPHRINE 40 MCG/ML (10ML) SYRINGE FOR IV PUSH (FOR BLOOD PRESSURE SUPPORT)
PREFILLED_SYRINGE | INTRAVENOUS | Status: DC | PRN
  Administered 2021-12-30: 120 ug via INTRAVENOUS
  Administered 2021-12-30: 80 ug via INTRAVENOUS

## 2021-12-30 MED ORDER — PROPOFOL 500 MG/50ML IV EMUL
INTRAVENOUS | Status: DC | PRN
  Administered 2021-12-30: 220 ug/kg/min via INTRAVENOUS

## 2021-12-30 MED ORDER — LACTATED RINGERS IV SOLN: INTRAVENOUS | Status: DC

## 2021-12-30 NOTE — Transfer of Care (Signed)
Immediate Anesthesia Transfer of Care Note  Patient: Ellen Anderson  Procedure(s) Performed: COLONOSCOPY WITH PROPOFOL ESOPHAGOGASTRODUODENOSCOPY (EGD) WITH PROPOFOL BALLOON DILATION FLEXIBLE SIGMOIDOSCOPY  Patient Location: Endoscopy Unit  Anesthesia Type:MAC  Level of Consciousness: sedated, patient cooperative and responds to stimulation  Airway & Oxygen Therapy: Patient Spontanous Breathing  Post-op Assessment: Report given to RN, Post -op Vital signs reviewed and stable and Patient moving all extremities  Post vital signs: Reviewed and stable  Last Vitals:  Vitals Value Taken Time  BP    Temp    Pulse    Resp    SpO2      Last Pain:  Vitals:   12/30/21 1002  TempSrc:   PainSc: 6       Patients Stated Pain Goal: 3 (89/37/34 2876)  Complications: No notable events documented.

## 2021-12-30 NOTE — Anesthesia Preprocedure Evaluation (Signed)
Anesthesia Evaluation  Patient identified by MRN, date of birth, ID band Patient awake    Reviewed: Allergy & Precautions, H&P , NPO status , Patient's Chart, lab work & pertinent test results, reviewed documented beta blocker date and time   History of Anesthesia Complications (+) PONV and history of anesthetic complications  Airway Mallampati: II  TM Distance: >3 FB Neck ROM: full    Dental no notable dental hx. (+) Edentulous Upper, Edentulous Lower   Pulmonary neg pulmonary ROS,    Pulmonary exam normal breath sounds clear to auscultation       Cardiovascular Exercise Tolerance: Good hypertension, Pt. on medications negative cardio ROS Normal cardiovascular exam Rhythm:regular Rate:Normal     Neuro/Psych Seizures -, Well Controlled,  negative psych ROS   GI/Hepatic negative GI ROS, Neg liver ROS, Medicated,  Endo/Other  negative endocrine ROS  Renal/GU negative Renal ROS  negative genitourinary   Musculoskeletal  (+) Arthritis ,   Abdominal   Peds  Hematology  (+) Blood dyscrasia, anemia ,   Anesthesia Other Findings   Reproductive/Obstetrics negative OB ROS                             Anesthesia Physical  Anesthesia Plan  ASA: 2  Anesthesia Plan: General   Post-op Pain Management:    Induction: Intravenous  PONV Risk Score and Plan: 3 and Propofol infusion  Airway Management Planned:   Additional Equipment:   Intra-op Plan:   Post-operative Plan:   Informed Consent: I have reviewed the patients History and Physical, chart, labs and discussed the procedure including the risks, benefits and alternatives for the proposed anesthesia with the patient or authorized representative who has indicated his/her understanding and acceptance.     Dental Advisory Given  Plan Discussed with: CRNA  Anesthesia Plan Comments:         Anesthesia Quick Evaluation

## 2021-12-30 NOTE — Telephone Encounter (Signed)
Good morning just finished EGD colonoscopy in this patient.  Unfortunately her colon was not prepped at all.  She had formed stool throughout the left side of her colon.  She states that she vomited up the prep, was unable to hold anything down.  Can we reschedule colonoscopy for her in 3 to 6 months with different colon preparation and extended clear liquids?  I have CCed Vicente Males as well who saw her in clinic.  Thank you

## 2021-12-30 NOTE — Discharge Instructions (Addendum)
EGD Discharge instructions Please read the instructions outlined below and refer to this sheet in the next few weeks. These discharge instructions provide you with general information on caring for yourself after you leave the hospital. Your doctor may also give you specific instructions. While your treatment has been planned according to the most current medical practices available, unavoidable complications occasionally occur. If you have any problems or questions after discharge, please call your doctor. ACTIVITY You may resume your regular activity but move at a slower pace for the next 24 hours.  Take frequent rest periods for the next 24 hours.  Walking will help expel (get rid of) the air and reduce the bloated feeling in your abdomen.  No driving for 24 hours (because of the anesthesia (medicine) used during the test).  You may shower.  Do not sign any important legal documents or operate any machinery for 24 hours (because of the anesthesia used during the test).  NUTRITION Drink plenty of fluids.  You may resume your normal diet.  Begin with a light meal and progress to your normal diet.  Avoid alcoholic beverages for 24 hours or as instructed by your caregiver.  MEDICATIONS You may resume your normal medications unless your caregiver tells you otherwise.  WHAT YOU CAN EXPECT TODAY You may experience abdominal discomfort such as a feeling of fullness or gas pains.  FOLLOW-UP Your doctor will discuss the results of your test with you.  SEEK IMMEDIATE MEDICAL ATTENTION IF ANY OF THE FOLLOWING OCCUR: Excessive nausea (feeling sick to your stomach) and/or vomiting.  Severe abdominal pain and distention (swelling).  Trouble swallowing.  Temperature over 101 F (37.8 C).  Rectal bleeding or vomiting of blood.   Colonoscopy Discharge Instructions  Read the instructions outlined below and refer to this sheet in the next few weeks. These discharge instructions provide you with  general information on caring for yourself after you leave the hospital. Your doctor may also give you specific instructions. While your treatment has been planned according to the most current medical practices available, unavoidable complications occasionally occur.   ACTIVITY You may resume your regular activity, but move at a slower pace for the next 24 hours.  Take frequent rest periods for the next 24 hours.  Walking will help get rid of the air and reduce the bloated feeling in your belly (abdomen).  No driving for 24 hours (because of the medicine (anesthesia) used during the test).   Do not sign any important legal documents or operate any machinery for 24 hours (because of the anesthesia used during the test).  NUTRITION Drink plenty of fluids.  You may resume your normal diet as instructed by your doctor.  Begin with a light meal and progress to your normal diet. Heavy or fried foods are harder to digest and may make you feel sick to your stomach (nauseated).  Avoid alcoholic beverages for 24 hours or as instructed.  MEDICATIONS You may resume your normal medications unless your doctor tells you otherwise.  WHAT YOU CAN EXPECT TODAY Some feelings of bloating in the abdomen.  Passage of more gas than usual.  Spotting of blood in your stool or on the toilet paper.  IF YOU HAD POLYPS REMOVED DURING THE COLONOSCOPY: No aspirin products for 7 days or as instructed.  No alcohol for 7 days or as instructed.  Eat a soft diet for the next 24 hours.  FINDING OUT THE RESULTS OF YOUR TEST Not all test results are available during  your visit. If your test results are not back during the visit, make an appointment with your caregiver to find out the results. Do not assume everything is normal if you have not heard from your caregiver or the medical facility. It is important for you to follow up on all of your test results.  SEEK IMMEDIATE MEDICAL ATTENTION IF: You have more than a spotting of  blood in your stool.  Your belly is swollen (abdominal distention).  You are nauseated or vomiting.  You have a temperature over 101.  You have abdominal pain or discomfort that is severe or gets worse throughout the day.   You had a narrowing of your esophagus which I stretched today.  Hopefully this helps with your swallowing.  Continue omeprazole twice daily.  Unfortunately your colon was not adequately prepped for colonoscopy today.  We will need to repeat in 3 to 6 months with different colon preparation.  My office will call you to set this up.  I hope you have a great rest of your week!  Elon Alas. Abbey Chatters, D.O. Gastroenterology and Hepatology [facility]

## 2021-12-30 NOTE — Telephone Encounter (Signed)
Yes. At this point, might need to be with any APP depending on schedules.

## 2021-12-30 NOTE — Anesthesia Postprocedure Evaluation (Signed)
Anesthesia Post Note  Patient: Ellen Anderson  Procedure(s) Performed: COLONOSCOPY WITH PROPOFOL ESOPHAGOGASTRODUODENOSCOPY (EGD) WITH PROPOFOL BALLOON DILATION FLEXIBLE SIGMOIDOSCOPY  Patient location during evaluation: Phase II Anesthesia Type: General Level of consciousness: awake Pain management: pain level controlled Vital Signs Assessment: post-procedure vital signs reviewed and stable Respiratory status: spontaneous breathing and respiratory function stable Cardiovascular status: blood pressure returned to baseline and stable Postop Assessment: no headache and no apparent nausea or vomiting Anesthetic complications: no Comments: Late entry   No notable events documented.   Last Vitals:  Vitals:   12/30/21 0759 12/30/21 1026  BP: 97/83 (!) 106/47  Pulse: 83   Resp: (!) 22 20  Temp: 37.3 C 37.6 C  SpO2: 96% 99%    Last Pain:  Vitals:   12/30/21 1026  TempSrc: Axillary  PainSc: 0-No pain                 Louann Sjogren

## 2021-12-30 NOTE — Op Note (Signed)
[facility] Patient Name: Ellen Anderson Procedure Date: 12/30/2021 9:46 AM MRN: [id number] Date of Birth: [birth year] Attending MD: Elon Alas. Abbey Chatters DO CSN: [id number] Age: 76 Admit Type: Outpatient Procedure:                Upper GI endoscopy Indications:              Dysphagia Providers:                Elon Alas. Abbey Chatters, DO, Lambert Mody, Nelma Rothman, Technician Referring MD:              Medicines:                See the Anesthesia note for documentation of the                            administered medications Complications:            No immediate complications. Estimated Blood Loss:     Estimated blood loss: none. Procedure:                Pre-Anesthesia Assessment:                           - The anesthesia plan was to use monitored                            anesthesia care (MAC).                           After obtaining informed consent, the endoscope was                            passed under direct vision. Throughout the                            procedure, the patient's blood pressure, pulse, and                            oxygen saturations were monitored continuously. The                            GIF-H190 ([id number]) scope was introduced through the                            mouth, and advanced to the efferent jejunal loop.                            The upper GI endoscopy was accomplished without                            difficulty. The patient tolerated the procedure                            well. Scope In: 05:39:76 AM Scope Out: 10:11:58 AM Total  Procedure Duration: 0 hours 5 minutes 46 seconds  Findings:      There is no endoscopic evidence of bleeding, areas of erosion,       esophagitis, ulcerations or varices in the entire esophagus.      No endoscopic abnormality was evident in the esophagus to explain the       patient's complaint of dysphagia. Preparations were made for empiric       dilation. A TTS dilator was  passed through the scope. Dilation with an       18-19-20 mm balloon dilator was performed to 20 mm. Dilation was       performed with a mild resistance at 20 mm. Estimated blood loss was none.      Evidence of a gastric bypass was found. A gastric pouch with a normal       size was found. The staple line appeared intact. The gastrojejunal       anastomosis was characterized by healthy appearing mucosa. This was       traversed. The pouch-to-jejunum limb was characterized by healthy       appearing mucosa. The duodenum-to-jejunum limb was not examined as it       could not be reached. Impression:               - Gastric bypass with a normal-sized pouch and                            intact staple line. Gastrojejunal anastomosis                            characterized by healthy appearing mucosa.                           - No specimens collected. Moderate Sedation:      Per Anesthesia Care Recommendation:           - Patient has a contact number available for                            emergencies. The signs and symptoms of potential                            delayed complications were discussed with the                            patient. Return to normal activities tomorrow.                            Written discharge instructions were provided to the                            patient.                           - Resume previous diet.                           - Continue present medications.                           -  Repeat upper endoscopy PRN for retreatment.                           - Return to GI clinic PRN.                           - Use a proton pump inhibitor PO BID. 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 All rights reserved. The codes documented in this report are preliminary and upon coder review may  be revised to meet current compliance requirements. Elon Alas. Abbey Chatters, DO [place] Abbey Chatters, DO 12/30/2021 10:16:03 AM This report has been signed electronically. Number of Addenda: 0

## 2021-12-30 NOTE — Telephone Encounter (Signed)
Vicente Males, do you want to see patient before rescheduling?

## 2021-12-30 NOTE — H&P (Signed)
Primary Care Physician:  Jani Gravel, MD Primary Gastroenterologist:  Dr. Abbey Chatters  Pre-Procedure History & Physical: HPI:  Ellen Anderson is a 76 y.o. female is here for an EGD for GERD, dysphagia and a colonoscopy for surveillance purposes/history of adenomatous colon polyps.   Past Medical History:  Diagnosis Date   Anemia    Chronic low back pain    Degenerative joint disease    Hip fracture, left ([place]) 03/12/2012   Hypertension    PONV (postoperative nausea and vomiting)     Past Surgical History:  Procedure Laterality Date   ABDOMINAL HYSTERECTOMY     ANKLE SURGERY Left    pins and screws   APPENDECTOMY     BACK SURGERY     BIOPSY  05/20/2020   Procedure: BIOPSY;  Surgeon: Daneil Dolin, MD;  Location: [facility];  Service: Endoscopy;;   CATARACT EXTRACTION W/PHACO Right 02/18/2015   Procedure: CATARACT EXTRACTION PHACO AND INTRAOCULAR LENS PLACEMENT RIGHT EYE; CDE:  10.48;  Surgeon: Tonny Branch, MD;  Location: [facility];  Service: Ophthalmology;  Laterality: Right;   CATARACT EXTRACTION W/PHACO Left 03/07/2015   Procedure: CATARACT EXTRACTION PHACO AND INTRAOCULAR LENS PLACEMENT (IOC);  Surgeon: Tonny Branch, MD;  Location: [facility];  Service: Ophthalmology;  Laterality: Left;  CDE:7.14   CHOLECYSTECTOMY     COLONOSCOPY N/A 03/04/2016   three polyps, hemorrhoids. Tubular adenoma. Colonoscopy 2022.   ESOPHAGOGASTRODUODENOSCOPY  07/23/2004   Dr. 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 Oneida Alar: food bolus which would not pass through gastric remnant into jejunostomy, PORK CHOP removed. AVOID PORK CHOPS   ESOPHAGOGASTRODUODENOSCOPY N/A 03/04/2016   Procedure: ESOPHAGOGASTRODUODENOSCOPY (EGD);  Surgeon: Danie Binder, MD;  [facility];  Service: Endoscopy;  Laterality: N/A;   ESOPHAGOGASTRODUODENOSCOPY (EGD) WITH PROPOFOL N/A 05/23/2019   Procedure: ESOPHAGOGASTRODUODENOSCOPY  (EGD) WITH PROPOFOL;  Surgeon: Danie Binder, MD;  Location: [facility];  Service: Endoscopy;  Laterality: N/A;  1:45pm   ESOPHAGOGASTRODUODENOSCOPY (EGD) WITH PROPOFOL N/A 05/20/2020   patent tubular esophagus s/p dilation, single island of salmon-colored epithelium. Barrett's esophagus. 3 year surveillance.   ESOPHAGOGASTRODUODENOSCOPY (EGD) WITH PROPOFOL N/A 10/17/2020   food in upper third of esophagus, gastric bypass with normal pouch. GJ anastomosis normal appearing.   GASTRIC BYPASS  12/08/1979   HIP SURGERY Bilateral    fractured bilateral hips   INTRAMEDULLARY (IM) NAIL INTERTROCHANTERIC Right 03/08/2013   Procedure: INTRAMEDULLARY (IM) NAIL INTERTROCHANTRIC;  Surgeon: Carole Civil, MD;  Location: [facility];  Service: Orthopedics;  Laterality: Right;   MALONEY DILATION N/A 05/20/2020   Procedure: Venia Minks DILATION;  Surgeon: Daneil Dolin, MD;  Location: [facility];  Service: Endoscopy;  Laterality: N/A;   SAVORY DILATION N/A 03/04/2016   Procedure: SAVORY DILATION;  Surgeon: Danie Binder, MD;  [facility]: [facility];  Service: Endoscopy;  Laterality: N/A;   SAVORY DILATION N/A 05/23/2019   Procedure: SAVORY DILATION;  Surgeon: Danie Binder, MD;  Location: [facility];  Service: Endoscopy;  Laterality: N/A;    Prior to Admission medications   Medication Sig Start Date End Date Taking? Authorizing Provider  aspirin 81 MG EC tablet Take 81 mg by mouth daily.   Yes [provider]  denosumab (PROLIA) 60 MG/ML SOLN injection Inject 60 mg into the skin every 6 (six) months. Administer in upper arm, thigh, or abdomen   Yes [provider]  diphenhydrAMINE (BENADRYL) 25  mg capsule Take 75 mg by mouth every 6 (six) hours as needed for itching. Takes 3 before and 3 after taking taking Oxycodone    Yes [provider]  omeprazole (PRILOSEC) 20 MG capsule TAKE 1 CAPSULE(20 MG) BY MOUTH TWICE DAILY BEFORE A MEAL 10/22/21  Yes Annitta Needs, NP   Oxycodone HCl 10 MG TABS Take 10 mg by mouth 3 (three) times daily as needed (pain). 03/09/20  Yes [provider]  polyethylene glycol-electrolytes (NULYTELY) 420 g solution As directed 10/22/21  Yes Christyana Corwin K, DO  promethazine (PHENERGAN) 12.5 MG tablet TAKE 1 TABLET BY MOUTH EVERY 6 TO 8 HOURS AS NEEDED FOR NAUSEA OR VOMITING 10/22/21  Yes Annitta Needs, NP  Propylene Glycol 0.6 % SOLN Place 1 drop into both eyes daily as needed (dry eyes).   Yes [provider]  TRULANCE 3 MG TABS TAKE 1 TABLET BY MOUTH DAILY 09/17/21  Yes Aliene Altes S, PA-C    Allergies as of 10/22/2021 - Review Complete 10/22/2021  Allergen Reaction Noted   Amoxicillin-pot clavulanate Swelling 12/25/2011   Ceclor [cefaclor] Swelling 12/25/2011   Elemental sulfur Shortness Of Breath 12/25/2011   Morphine and related Other (See Comments) 12/25/2011   Azithromycin Other (See Comments) 12/26/2011   Flexeril [cyclobenzaprine] Hives and Swelling 02/20/2016   Ivp dye [iodinated contrast media] Other (See Comments) 12/25/2011   Methadone Other (See Comments) 12/25/2011   Percocet [oxycodone-acetaminophen] Itching 12/25/2011   Betadine [povidone iodine] Rash 02/18/2015   Codeine Hives and Rash 12/25/2011   Escitalopram oxalate Other (See Comments) 12/25/2011   Ibuprofen Hives and Rash 12/26/2011   Tylenol [acetaminophen] Hives and Rash 12/26/2011    Family History  Problem Relation Age of Onset   Throat cancer Mother    COPD Father    Stomach cancer Sister    COPD Sister    Colon polyps Sister    COPD Sister    Colon cancer Neg Hx    Esophageal cancer Neg Hx     Social History   Socioeconomic History   Marital status: Widowed    Spouse name: Not on file   Number of children: Not on file   Years of education: Not on file   Highest education level: Not on file  Occupational History   Not on file  Tobacco Use   Smoking status: Never   Smokeless tobacco: Never  Vaping Use    Vaping Use: Never used  Substance and Sexual Activity   Alcohol use: No    Alcohol/week: 0.0 standard drinks   Drug use: No   Sexual activity: Never    Birth control/protection: Surgical  Other Topics Concern   Not on file  Social History Narrative   Not on file   Social Determinants of Health   Financial Resource Strain: Not on file  Food Insecurity: Not on file  Transportation Needs: Not on file  Physical Activity: Not on file  Stress: Not on file  Social Connections: Not on file  Intimate Partner Violence: Not on file    Review of Systems: See HPI, otherwise negative ROS  Physical Exam: Vital signs in last 24 hours: Temp:  [99.1 F (37.3 C)] 99.1 F (37.3 C) (01/24 0759) Pulse Rate:  [83] 83 (01/24 0759) Resp:  [22] 22 (01/24 0759) BP: (97)/(83) 97/83 (01/24 0759) SpO2:  [96 %] 96 % (01/24 0759) Weight:  [72.6 kg] 72.6 kg (01/24 0759)   General:   Alert,  Well-developed, well-nourished, pleasant and cooperative  in NAD Head:  Normocephalic and atraumatic. Eyes:  Sclera clear, no icterus.   Conjunctiva pink. Ears:  Normal auditory acuity. Nose:  No deformity, discharge,  or lesions. Mouth:  No deformity or lesions, dentition normal. Neck:  Supple; no masses or thyromegaly. Lungs:  Clear throughout to auscultation.   No wheezes, crackles, or rhonchi. No acute distress. Heart:  Regular rate and rhythm; no murmurs, clicks, rubs,  or gallops. Abdomen:  Soft, nontender and nondistended. No masses, hepatosplenomegaly or hernias noted. Normal bowel sounds, without guarding, and without rebound.   Msk:  Symmetrical without gross deformities. Normal posture. Extremities:  Without clubbing or edema. Neurologic:  Alert and  oriented x4;  grossly normal neurologically. Skin:  Intact without significant lesions or rashes. Cervical Nodes:  No significant cervical adenopathy. Psych:  Alert and cooperative. Normal mood and affect.  Impression/Plan: Jeanella Flattery is here for an  EGD for GERD, dysphagia and a colonoscopy for surveillance purposes/history of adenomatous colon polyps.   The risks of the procedure including infection, bleed, or perforation as well as benefits, limitations, alternatives and imponderables have been reviewed with the patient. Questions have been answered. All parties agreeable.

## 2021-12-30 NOTE — Op Note (Signed)
[facility] Patient Name: Ellen Anderson Procedure Date: 12/30/2021 10:18 AM MRN: [id number] Date of Birth: Nov 12, 1946 Attending MD: Elon Alas. Abbey Chatters DO CSN: [id number] Age: 76 Admit Type: Outpatient Procedure:                Colonoscopy Indications:              Surveillance: Personal history of adenomatous                            polyps on last colonoscopy 5 years ago Providers:                Elon Alas. Abbey Chatters, DO, Lambert Mody, Nelma Rothman, Technician Referring MD:              Medicines:                See the Anesthesia note for documentation of the                            administered medications Complications:            No immediate complications. Estimated Blood Loss:     Estimated blood loss: none. Procedure:                Pre-Anesthesia Assessment:                           - The anesthesia plan was to use monitored                            anesthesia care (MAC).                           After obtaining informed consent, the colonoscope                            was passed under direct vision. Throughout the                            procedure, the patient's blood pressure, pulse, and                            oxygen saturations were monitored continuously. The                            [id number]-HQ190L ([id number]) scope was introduced through                            the anus with the intention of advancing to the                            cecum. The scope was advanced to the sigmoid colon                            before the procedure was aborted.  Medications were                            given. The colonoscopy was performed without                            difficulty. The patient tolerated the procedure                            well. The quality of the bowel preparation was                            evaluated using the BBPS [facility] Bowel Preparation                            Scale) with scores of: Left Colon = 0  (unprepared,                            mucosa not seen due to solid stool that cannot be                            cleared or unseen proximal colon segment in a                            colonoscopy aborted due to inadequate bowel prep).                            The total BBPS score equals 0. The quality of the                            bowel preparation was inadequate. Scope In: 10:20:53 AM Scope Out: 10:22:19 AM Total Procedure Duration: 0 hours 1 minute 26 seconds  Findings:      The perianal and digital rectal examinations were normal.      Non-bleeding internal hemorrhoids were found during endoscopy.      Solid stool was found in the recto-sigmoid colon and in the sigmoid       colon, precluding visualization. Impression:               - Preparation of the colon was inadequate.                           - Non-bleeding internal hemorrhoids.                           - Stool in the recto-sigmoid colon and in the                            sigmoid colon.                           - No specimens collected. Moderate Sedation:      Per Anesthesia Care Recommendation:           - Patient has a contact number available for  emergencies. The signs and symptoms of potential                            delayed complications were discussed with the                            patient. Return to normal activities tomorrow.                            Written discharge instructions were provided to the                            patient.                           - Resume previous diet.                           - Continue present medications.                           - Repeat colonoscopy within 3-6 months because the                            bowel preparation was poor.                           - Return to GI clinic after studies are complete. Procedure Code(s):        --- Professional ---                           X7939, 53, Colorectal cancer screening;  colonoscopy                            on individual at high risk Diagnosis Code(s):        --- Professional ---                           Z86.010, Personal history of colonic polyps                           K64.8, Other hemorrhoids CPT copyright 2019 American Medical Association. All rights reserved. The codes documented in this report are preliminary and upon coder review may  be revised to meet current compliance requirements. Elon Alas. Abbey Chatters, DO [name], DO 12/30/2021 10:27:37 AM This report has been signed electronically. Number of Addenda: 0

## 2022-01-01 ENCOUNTER — Encounter (HOSPITAL_COMMUNITY): Payer: Self-pay | Admitting: Internal Medicine

## 2022-02-23 ENCOUNTER — Other Ambulatory Visit: Payer: Self-pay | Admitting: Gastroenterology

## 2022-02-23 DIAGNOSIS — K5900 Constipation, unspecified: Secondary | ICD-10-CM

## 2022-02-23 NOTE — Telephone Encounter (Signed)
Last office visit 10/22/21 ?

## 2022-03-03 ENCOUNTER — Ambulatory Visit: Payer: Medicare Other | Admitting: Gastroenterology

## 2022-03-19 ENCOUNTER — Other Ambulatory Visit: Payer: Self-pay | Admitting: Gastroenterology

## 2022-03-19 NOTE — Telephone Encounter (Signed)
LAST OV 10/22/21 ?

## 2022-03-24 ENCOUNTER — Encounter: Payer: Self-pay | Admitting: *Deleted

## 2022-05-18 ENCOUNTER — Other Ambulatory Visit (HOSPITAL_COMMUNITY): Payer: Self-pay | Admitting: Nurse Practitioner

## 2022-06-23 ENCOUNTER — Other Ambulatory Visit (HOSPITAL_COMMUNITY): Payer: Self-pay | Admitting: Nurse Practitioner

## 2022-06-23 DIAGNOSIS — M810 Age-related osteoporosis without current pathological fracture: Secondary | ICD-10-CM

## 2022-07-09 ENCOUNTER — Ambulatory Visit (HOSPITAL_COMMUNITY)
Admission: RE | Admit: 2022-07-09 | Discharge: 2022-07-09 | Disposition: A | Discharge status: Home / Self Care | Payer: Medicare Other | Source: Ambulatory Visit | Attending: Nurse Practitioner | Admitting: Nurse Practitioner

## 2022-07-09 ENCOUNTER — Other Ambulatory Visit (HOSPITAL_COMMUNITY): Payer: Self-pay | Admitting: Nurse Practitioner

## 2022-07-09 DIAGNOSIS — M810 Age-related osteoporosis without current pathological fracture: Secondary | ICD-10-CM

## 2022-12-15 ENCOUNTER — Other Ambulatory Visit (HOSPITAL_COMMUNITY): Payer: Self-pay | Admitting: Family Medicine

## 2022-12-15 DIAGNOSIS — N390 Urinary tract infection, site not specified: Secondary | ICD-10-CM

## 2022-12-25 ENCOUNTER — Ambulatory Visit (HOSPITAL_COMMUNITY)
Admission: RE | Admit: 2022-12-25 | Discharge: 2022-12-25 | Disposition: A | Discharge status: Home / Self Care | Payer: Medicare Other | Source: Ambulatory Visit | Attending: Family Medicine | Admitting: Family Medicine

## 2022-12-25 DIAGNOSIS — N390 Urinary tract infection, site not specified: Secondary | ICD-10-CM | POA: Diagnosis present

## 2023-01-03 ENCOUNTER — Other Ambulatory Visit: Payer: Self-pay | Admitting: Gastroenterology

## 2023-02-19 ENCOUNTER — Other Ambulatory Visit: Payer: Self-pay | Admitting: Gastroenterology

## 2023-02-19 ENCOUNTER — Telehealth: Payer: Self-pay

## 2023-02-19 DIAGNOSIS — K5900 Constipation, unspecified: Secondary | ICD-10-CM

## 2023-02-19 MED ORDER — TRULANCE 3 MG PO TABS: 1.0000 | ORAL_TABLET | Freq: Every day | ORAL | 2 refills | Status: DC

## 2023-02-19 NOTE — Telephone Encounter (Signed)
Pt called and wants a refill on her Trulance. Pt's last ov was 10-22-2021 to [facility] on [address]

## 2023-02-19 NOTE — Telephone Encounter (Signed)
Phoned and advised the pt of her  Rx being sent to her pharmacy. 

## 2024-01-06 ENCOUNTER — Other Ambulatory Visit: Payer: Self-pay | Admitting: Gastroenterology

## 2024-01-06 NOTE — Telephone Encounter (Signed)
Please schedule for an appt before any refills

## 2024-01-06 NOTE — Telephone Encounter (Signed)
Pt was scheduled a f/u appt

## 2024-01-11 ENCOUNTER — Encounter: Payer: Self-pay | Admitting: Gastroenterology

## 2024-01-11 ENCOUNTER — Ambulatory Visit (INDEPENDENT_AMBULATORY_CARE_PROVIDER_SITE_OTHER): Payer: Medicare Other | Admitting: Gastroenterology

## 2024-01-11 VITALS — BP 114/76 | HR 81 | Temp 98.6°F | Ht 62.0 in | Wt 162.2 lb

## 2024-01-11 DIAGNOSIS — K5900 Constipation, unspecified: Secondary | ICD-10-CM

## 2024-01-11 DIAGNOSIS — R1310 Dysphagia, unspecified: Secondary | ICD-10-CM | POA: Diagnosis not present

## 2024-01-11 MED ORDER — PROMETHAZINE HCL 12.5 MG PO TABS: ORAL_TABLET | ORAL | 3 refills | Status: DC

## 2024-01-11 MED ORDER — OMEPRAZOLE 20 MG PO CPDR
DELAYED_RELEASE_CAPSULE | ORAL | 3 refills | Status: AC
Start: 2024-01-11 — End: ?

## 2024-01-11 MED ORDER — LUBIPROSTONE 24 MCG PO CAPS: 24.0000 ug | ORAL_CAPSULE | Freq: Two times a day (BID) | ORAL | 3 refills | Status: DC

## 2024-01-11 NOTE — Patient Instructions (Signed)
 Let's stop Trulance .  Instead, I have sent in Amitiza  to take twice a day WITH FOOD (to avoid nausea). Let me know if too expensive or no improvement!  We are arranging the upper endoscopy with dilation by Dr. Cindie in near future!  We will see you thereafter!   I enjoyed seeing you again today! I value our relationship and want to provide genuine, compassionate, and quality care. You may receive a survey regarding your visit with me, and I welcome your feedback! Thanks so much for taking the time to complete this. I look forward to seeing you again.      Therisa MICAEL Stager, PhD, ANP-BC [facility]

## 2024-01-11 NOTE — Progress Notes (Signed)
 Gastroenterology Office Note     Primary Care Physician:  [name], MD  Primary Gastroenterologist: Dr. Cindie   Chief Complaint   Chief Complaint  Patient presents with   Follow-up    Follow up on refills     History of Present Illness   Ellen Anderson is a 78 y.o. female presenting today with a history of GERD, Barrett's esophagus, dysphagia with multiple prior dilations, and chronic constipation. Last seen virtually in 2022.   Constipation: taking Trulance  daily.  Has tried Linzess . Has not tried Amitiza . Has BM only if taking the laxative twice a week. No overt GI bleeding. Omeprazole  BID.   Would like repeat EGD. More dysphagia. Improved after last dilation in 2023.    Endoscopic procedures:  Colonoscopy Jan 2023: inadequate prep, solid stool, non-bleeding internal hemorrhoids.  EGD Jan 2023: gastric bypass, GJ anastomosis with healthy mucosa. Empiric balloon dilation.  Colonoscopy 2017: adenomas  Past Medical History:  Diagnosis Date   Anemia    Chronic low back pain    Degenerative joint disease    Hip fracture, left (HCC) 03/12/2012   Hypertension    PONV (postoperative nausea and vomiting)     Past Surgical History:  Procedure Laterality Date   ABDOMINAL HYSTERECTOMY     ANKLE SURGERY Left    pins and screws   APPENDECTOMY     BACK SURGERY     BALLOON DILATION N/A 12/30/2021   Procedure: BALLOON DILATION;  Surgeon: Cindie Carlin POUR, DO;  [facility];  Service: Endoscopy;  Laterality: N/A;   BIOPSY  05/20/2020   Procedure: BIOPSY;  Surgeon: Shaaron Lamar HERO, MD;  [facility];  Service: Endoscopy;;   CATARACT EXTRACTION W/PHACO Right 02/18/2015   Procedure: CATARACT EXTRACTION PHACO AND INTRAOCULAR LENS PLACEMENT RIGHT EYE; CDE:  10.48;  Surgeon: Cherene Mania, MD;  Location: [facility];  Service: Ophthalmology;  Laterality: Right;   CATARACT EXTRACTION W/PHACO Left 03/07/2015   Procedure: CATARACT EXTRACTION PHACO AND INTRAOCULAR  LENS PLACEMENT (IOC);  Surgeon: Cherene Mania, MD;  Location: [facility];  Service: Ophthalmology;  Laterality: Left;  CDE:7.14   CHOLECYSTECTOMY     COLONOSCOPY N/A 03/04/2016   three polyps, hemorrhoids. Tubular adenoma. Colonoscopy 2022.   ESOPHAGOGASTRODUODENOSCOPY  07/23/2004   Dr. 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 Harvey: food bolus which would not pass through gastric remnant into jejunostomy, PORK CHOP removed. AVOID PORK CHOPS   ESOPHAGOGASTRODUODENOSCOPY N/A 03/04/2016   Procedure: ESOPHAGOGASTRODUODENOSCOPY (EGD);  Surgeon: Margo LITTIE Harvey, MD;  [facility];  Service: Endoscopy;  Laterality: N/A;   ESOPHAGOGASTRODUODENOSCOPY (EGD) WITH PROPOFOL  N/A 05/23/2019   Procedure: ESOPHAGOGASTRODUODENOSCOPY (EGD) WITH PROPOFOL ;  Surgeon: Harvey Margo LITTIE, MD;  [facility];  Service: Endoscopy;  Laterality: N/A;  1:45pm   ESOPHAGOGASTRODUODENOSCOPY (EGD) WITH PROPOFOL  N/A 05/20/2020   patent tubular esophagus s/p dilation, single island of salmon-colored epithelium. Barrett's esophagus. 3 year surveillance.   ESOPHAGOGASTRODUODENOSCOPY (EGD) WITH PROPOFOL  N/A 10/17/2020   food in upper third of esophagus, gastric bypass with normal pouch. GJ anastomosis normal appearing.   ESOPHAGOGASTRODUODENOSCOPY (EGD) WITH PROPOFOL  N/A 12/30/2021   Procedure: ESOPHAGOGASTRODUODENOSCOPY (EGD) WITH PROPOFOL ;  Surgeon: Cindie Carlin POUR, DO;  [facility];  Service: Endoscopy;  Laterality: N/A;   FLEXIBLE SIGMOIDOSCOPY  12/30/2021   Procedure: FLEXIBLE SIGMOIDOSCOPY;  Surgeon: Cindie Carlin POUR, DO;  [facility];  Service: Endoscopy;;  GASTRIC BYPASS  12/08/1979   HIP SURGERY Bilateral    fractured bilateral hips   INTRAMEDULLARY (IM) NAIL INTERTROCHANTERIC Right 03/08/2013   Procedure: INTRAMEDULLARY (IM) NAIL INTERTROCHANTRIC;  Surgeon: Taft FORBES Minerva, MD;   Location: [facility];  Service: Orthopedics;  Laterality: Right;   MALONEY DILATION N/A 05/20/2020   Procedure: AGAPITO DILATION;  Surgeon: Shaaron Lamar HERO, MD;  [facility];  Service: Endoscopy;  Laterality: N/A;   SAVORY DILATION N/A 03/04/2016   Procedure: SAVORY DILATION;  Surgeon: Margo LITTIE Haddock, MD;  [facility];  Service: Endoscopy;  Laterality: N/A;   SAVORY DILATION N/A 05/23/2019   Procedure: SAVORY DILATION;  Surgeon: Haddock Margo LITTIE, MD;  [facility];  Service: Endoscopy;  Laterality: N/A;    Current Outpatient Medications  Medication Sig Dispense Refill   denosumab (PROLIA) 60 MG/ML SOLN injection Inject 60 mg into the skin every 6 (six) months. Administer in upper arm, thigh, or abdomen     diphenhydrAMINE  (BENADRYL ) 25 mg capsule Take 75 mg by mouth every 6 (six) hours as needed for itching. Takes 3 before and 3 after taking taking Oxycodone       omeprazole  (PRILOSEC) 20 MG capsule TAKE 1 CAPSULE(20 MG) BY MOUTH TWICE DAILY BEFORE A MEAL 180 capsule 3   Oxycodone  HCl 10 MG TABS Take 10 mg by mouth 3 (three) times daily as needed (pain).     Plecanatide  (TRULANCE ) 3 MG TABS Take 1 tablet (3 mg total) by mouth daily. 90 tablet 2   promethazine  (PHENERGAN ) 12.5 MG tablet TAKE 1 TABLET BY MOUTH EVERY 6 TO 8 HOURS AS NEEDED FOR NAUSEA OR VOMITING 30 tablet 3   Propylene Glycol 0.6 % SOLN Place 1 drop into both eyes daily as needed (dry eyes).     aspirin 81 MG EC tablet Take 81 mg by mouth daily. (Patient not taking: Reported on 01/11/2024)     No current facility-administered medications for this visit.    Allergies as of 01/11/2024 - Review Complete 01/11/2024  Allergen Reaction Noted   Amoxicillin-pot clavulanate Swelling 12/25/2011   Ceclor [cefaclor] Swelling 12/25/2011   Elemental sulfur Shortness Of Breath 12/25/2011   Morphine and codeine Other (See Comments) 12/25/2011   Azithromycin Other (See Comments) 12/26/2011   Flexeril  [cyclobenzaprine] Hives and Swelling 02/20/2016   Ivp dye [iodinated contrast media] Other (See Comments) 12/25/2011   Methadone Other (See Comments) 12/25/2011   Percocet [oxycodone -acetaminophen ] Itching 12/25/2011   Betadine [povidone iodine] Rash 02/18/2015   Codeine Hives and Rash 12/25/2011   Escitalopram oxalate Other (See Comments) 12/25/2011   Ibuprofen Hives and Rash 12/26/2011   Tylenol  [acetaminophen ] Hives, Nausea Only, and Rash 12/26/2011    Family History  Problem Relation Age of Onset   Throat cancer Mother    COPD Father    Stomach cancer Sister    COPD Sister    Colon polyps Sister    COPD Sister    Colon cancer Neg Hx    Esophageal cancer Neg Hx     Social History   Socioeconomic History   Marital status: Widowed    Spouse name: Not on file   Number of children: Not on file   Years of education: Not on file   Highest education level: Not on file  Occupational History   Not on file  Tobacco Use   Smoking status: Never   Smokeless tobacco: Never  Vaping Use   Vaping status: Never Used  Substance and Sexual Activity  Alcohol use: No    Alcohol/week: 0.0 standard drinks of alcohol   Drug use: No   Sexual activity: Never    Birth control/protection: Surgical  Other Topics Concern   Not on file  Social History Narrative   Not on file   Social Drivers of Health   Financial Resource Strain: Not on file  Food Insecurity: Not on file  Transportation Needs: Not on file  Physical Activity: Not on file  Stress: Not on file  Social Connections: Not on file  Intimate Partner Violence: Not on file     Review of Systems   Gen: Denies any fever, chills, fatigue, weight loss, lack of appetite.  CV: Denies chest pain, heart palpitations, peripheral edema, syncope.  Resp: Denies shortness of breath at rest or with exertion. Denies wheezing or cough.  GI: Denies dysphagia or odynophagia. Denies jaundice, hematemesis, fecal incontinence. GU : Denies  urinary burning, urinary frequency, urinary hesitancy MS: Denies joint pain, muscle weakness, cramps, or limitation of movement.  Derm: Denies rash, itching, dry skin Psych: Denies depression, anxiety, memory loss, and confusion Heme: Denies bruising, bleeding, and enlarged lymph nodes.   Physical Exam   BP 114/76   Pulse 81   Temp 98.6 F (37 C)   Ht 5' 2 (1.575 m)   Wt 162 lb 3.2 oz (73.6 kg)   BMI 29.67 kg/m  General:   Alert and oriented. Pleasant and cooperative. Well-nourished and well-developed.  Head:  Normocephalic and atraumatic. Eyes:  Without icterus Abdomen:  +BS, soft, non-tender and non-distended. No HSM noted. No guarding or rebound. No masses appreciated.  Rectal:  Deferred  Msk:  Symmetrical without gross deformities. Normal posture. Extremities:  Without edema. Neurologic:  Alert and  oriented x4;  grossly normal neurologically. Skin:  Intact without significant lesions or rashes. Psych:  Alert and cooperative. Normal mood and affect.   Assessment   Ellen Anderson is a 78 y.o. female presenting today with a history of  GERD, Barrett's esophagus, dysphagia with multiple prior dilations, and chronic constipation. Last seen virtually in 2022. Here for refills and concerns with constipation and dysphagia.   Dysphagia; recurrent solid food dysphagia and noted improvement with empiric dilation in 2023. Continue PPI BID. EGD/dil to be arranged. Consider UGI/BPE if persistent  Constipation: currently with significant lag in BMs despite trulance  and requires laxatives twice a week to have BM. WIll trial Amitiza  24 mcg po BID. Previously has tried Linzess .   Colonoscopy Jan 2023: inadequate prep, solid stool, non-bleeding internal hemorrhoids. Wants to hold off on repeat colonoscopy. Due to age, can follow conservatively.     PLAN   PPI BID Proceed with upper endoscopy/dilation by Dr. Cindie in near future: the risks, benefits, and alternatives have been discussed  with the patient in detail. The patient states understanding and desires to proceed.  Trial of Amitiza  24 mcg BID Consider UGI/BPE if persistent dysphagia Follow-up in clinic thereafter    Therisa MICAEL Stager, PhD, ANP-BC [facility]

## 2024-01-17 ENCOUNTER — Telehealth: Payer: Self-pay

## 2024-01-17 NOTE — Telephone Encounter (Signed)
 I am happy to change it, but did she try it WITH food? Just wanting to make sure.

## 2024-01-17 NOTE — Telephone Encounter (Signed)
 Returned the pt's call and was advised by her that the Amitiza  makes her nauseated and she just kept vomiting. She states she stopped taking it 2 days ago and she stopped vomiting. She is asking that you change her constipation medication for her

## 2024-01-17 NOTE — Telephone Encounter (Signed)
 Yes she stated she tried it with food. I asked her as well did she take her nausea medication she stated yes and it did not help any but when she stopped Amitiza  her vomiting stopped

## 2024-01-18 NOTE — Telephone Encounter (Signed)
Do we have any samples of Motegrity? We could trial this.

## 2024-01-18 NOTE — Telephone Encounter (Signed)
We never get Motegrity in here. We get Movantik but we do not have any of those samples either

## 2024-02-10 ENCOUNTER — Telehealth: Payer: Self-pay | Admitting: *Deleted

## 2024-02-10 NOTE — Telephone Encounter (Signed)
 Called to schedule pt and she states that she will have to call back to schedule, has to get with her sister who lives in [place].

## 2024-02-15 ENCOUNTER — Encounter: Payer: Self-pay | Admitting: *Deleted

## 2024-02-22 ENCOUNTER — Encounter: Payer: Self-pay | Admitting: *Deleted

## 2024-02-22 NOTE — Telephone Encounter (Signed)
 Ellen Anderson:   Ellen Anderson (DOB: [birth year])  she is here to see about getting her EGD scheduled.  Can you see her or should I get a number?   She said to just call her.  I saw the note where Ailah Barna last spoke with the patient.  She is hoping she can get it scheduled next month.

## 2024-03-13 NOTE — Telephone Encounter (Signed)
 Can we reach back out to patient and see how she is doing with constipation? I see she has an EGD scheduled for tomorrow.   I can send in Motegrity for her, as we don't have samples.

## 2024-03-14 ENCOUNTER — Encounter (HOSPITAL_COMMUNITY)
Admission: RE | Disposition: A | Discharge status: Home / Self Care | Payer: Self-pay | Source: Home / Self Care | Attending: Internal Medicine

## 2024-03-14 ENCOUNTER — Ambulatory Visit (HOSPITAL_COMMUNITY): Admitting: Anesthesiology

## 2024-03-14 ENCOUNTER — Encounter (HOSPITAL_COMMUNITY): Payer: Self-pay | Admitting: Internal Medicine

## 2024-03-14 ENCOUNTER — Ambulatory Visit (HOSPITAL_COMMUNITY)
Admission: RE | Admit: 2024-03-14 | Discharge: 2024-03-14 | Disposition: A | Discharge status: Home / Self Care | Attending: Internal Medicine | Admitting: Internal Medicine

## 2024-03-14 ENCOUNTER — Other Ambulatory Visit: Payer: Self-pay

## 2024-03-14 DIAGNOSIS — I10 Essential (primary) hypertension: Secondary | ICD-10-CM | POA: Insufficient documentation

## 2024-03-14 DIAGNOSIS — K222 Esophageal obstruction: Secondary | ICD-10-CM | POA: Diagnosis not present

## 2024-03-14 DIAGNOSIS — K2289 Other specified disease of esophagus: Secondary | ICD-10-CM | POA: Insufficient documentation

## 2024-03-14 DIAGNOSIS — Q394 Esophageal web: Secondary | ICD-10-CM | POA: Diagnosis not present

## 2024-03-14 DIAGNOSIS — G40909 Epilepsy, unspecified, not intractable, without status epilepticus: Secondary | ICD-10-CM | POA: Insufficient documentation

## 2024-03-14 DIAGNOSIS — Z980 Intestinal bypass and anastomosis status: Secondary | ICD-10-CM | POA: Insufficient documentation

## 2024-03-14 HISTORY — PX: ESOPHAGEAL DILATION: SHX303

## 2024-03-14 HISTORY — PX: ESOPHAGOGASTRODUODENOSCOPY: SHX5428

## 2024-03-14 SURGERY — EGD (ESOPHAGOGASTRODUODENOSCOPY)
Anesthesia: General

## 2024-03-14 MED ORDER — PROPOFOL 10 MG/ML IV BOLUS
INTRAVENOUS | Status: DC | PRN
  Administered 2024-03-14: 30 mg via INTRAVENOUS
  Administered 2024-03-14 (×2): 20 mg via INTRAVENOUS
  Administered 2024-03-14: 30 mg via INTRAVENOUS

## 2024-03-14 MED ORDER — LACTATED RINGERS IV SOLN: INTRAVENOUS | Status: DC | PRN

## 2024-03-14 MED ORDER — LACTATED RINGERS IV SOLN: INTRAVENOUS | Status: DC

## 2024-03-14 MED ORDER — PHENYLEPHRINE HCL (PRESSORS) 10 MG/ML IV SOLN
INTRAVENOUS | Status: DC | PRN
  Administered 2024-03-14 (×3): 80 ug via INTRAVENOUS

## 2024-03-14 NOTE — Op Note (Signed)
 [facility] Patient Name: Ellen Anderson Procedure Date: 03/14/2024 11:03 AM MRN: [id number] Date of Birth: April 02, 1946 Attending MD: Hennie Duos. Marletta Lor , [place], [id number] CSN: [id number] Age: 78 Admit Type: Outpatient Procedure:                Upper GI endoscopy Indications:              Dysphagia Providers:                Hennie Duos. Marletta Lor, DO, Crystal Page, [employer].                            Jessee Avers, Technician Referring MD:              Medicines:                See the Anesthesia note for documentation of the                            administered medications Complications:            No immediate complications. Estimated Blood Loss:     Estimated blood loss was minimal. Procedure:                Pre-Anesthesia Assessment:                           - The anesthesia plan was to use monitored                            anesthesia care (MAC).                           After obtaining informed consent, the endoscope was                            passed under direct vision. Throughout the                            procedure, the patient's blood pressure, pulse, and                            oxygen saturations were monitored continuously. The                            [id number]-[id number] ([id number]) scope was introduced through the                            mouth, and advanced to the efferent jejunal loop.                            The upper GI endoscopy was accomplished without                            difficulty. The patient tolerated the procedure                            well. 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 A TTS dilator was passed       through the scope. Dilation with an 18-19-20 mm balloon dilator was       performed to 20 mm. The dilation site was examined and showed mild       mucosal disruption and moderate improvement in luminal narrowing.       The Z-line was variable. Biopsies were taken with a cold forceps for       histology.      Evidence of a Roux-en-Y gastrojejunostomy was found. The gastrojejunal       anastomosis was characterized by healthy appearing mucosa. This was       traversed. The pouch-to-jejunum limb was characterized by healthy       appearing mucosa. The duodenum-to-jejunum limb was not examined as it       could not be reached.      The examined jejunum was normal. 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 Dilated.                           - Z-line variable. Biopsied.                           - Roux-en-Y gastrojejunostomy with gastrojejunal                            anastomosis characterized by healthy appearing                            mucosa.                           - Normal examined jejunum. Moderate Sedation:      Per Anesthesia Care Recommendation:           - Patient has a contact number available for                            emergencies. The signs and symptoms of potential                            delayed complications were discussed with the                            patient. Return to normal activities tomorrow.                            Written discharge instructions were provided to the                            patient.                           - Resume previous diet.                           -  Continue present medications.                           - Await pathology results.                           - Repeat upper endoscopy PRN for retreatment.                           - Return to GI clinic in 8 weeks.                           - Use a proton pump inhibitor PO BID. Procedure Code(s):        --- Professional ---                           [phone], Esophagogastroduodenoscopy, flexible,                            transoral; with transendoscopic balloon dilation of                            esophagus (less than 30 mm diameter)                           43239, 59,  Esophagogastroduodenoscopy, flexible,                            transoral; with biopsy, single or multiple Diagnosis Code(s):        --- Professional ---                           Q39.4, Esophageal web                           K22.89, Other specified disease of esophagus                           Z98.0, Intestinal bypass and anastomosis status                           R13.10, Dysphagia, unspecified CPT copyright 2022 American Medical Association. All rights reserved. The codes documented in this report are preliminary and upon coder review may  be revised to meet current compliance requirements. Hennie Duos. Marletta Lor, DO Hennie Duos. Marletta Lor, DO 03/14/2024 11:39:25 AM This report has been signed electronically. Number of Addenda: 0

## 2024-03-14 NOTE — H&P (Signed)
 Primary Care Physician:  Pearson Grippe, MD Primary Gastroenterologist:  Dr. Marletta Lor  Pre-Procedure History & Physical: HPI:  Ellen Anderson is a 78 y.o. female is here for an EGD with possible dilation due to history of dysphagia, GERD  Past Medical History:  Diagnosis Date   Anemia    Chronic low back pain    Degenerative joint disease    Hip fracture, left (HCC) 03/12/2012   Hypertension    PONV (postoperative nausea and vomiting)     Past Surgical History:  Procedure Laterality Date   ABDOMINAL HYSTERECTOMY     ANKLE SURGERY Left    pins and screws   APPENDECTOMY     BACK SURGERY     BALLOON DILATION N/A 12/30/2021   Procedure: BALLOON DILATION;  Surgeon: Lanelle Bal, DO;  [facility]: [facility];  Service: Endoscopy;  Laterality: N/A;   BIOPSY  05/20/2020   Procedure: BIOPSY;  Surgeon: Corbin Ade, MD;  [facility]: [facility];  Service: Endoscopy;;   CATARACT EXTRACTION W/PHACO Right 02/18/2015   Procedure: CATARACT EXTRACTION PHACO AND INTRAOCULAR LENS PLACEMENT RIGHT EYE; CDE:  10.48;  Surgeon: Gemma Payor, MD;  Location: [facility];  Service: Ophthalmology;  Laterality: Right;   CATARACT EXTRACTION W/PHACO Left 03/07/2015   Procedure: CATARACT EXTRACTION PHACO AND INTRAOCULAR LENS PLACEMENT (IOC);  Surgeon: Gemma Payor, MD;  Location: [facility];  Service: Ophthalmology;  Laterality: Left;  CDE:7.14   CHOLECYSTECTOMY     COLONOSCOPY N/A 03/04/2016   three polyps, hemorrhoids. Tubular adenoma. Colonoscopy 2022.   ESOPHAGOGASTRODUODENOSCOPY  07/23/2004   Dr. 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 Darrick Penna: food bolus which would not pass through gastric remnant into jejunostomy, PORK CHOP removed. AVOID PORK CHOPS   ESOPHAGOGASTRODUODENOSCOPY N/A 03/04/2016   Procedure: ESOPHAGOGASTRODUODENOSCOPY (EGD);  Surgeon: West Bali, MD;  [facility]: [facility];  Service: Endoscopy;   Laterality: N/A;   ESOPHAGOGASTRODUODENOSCOPY (EGD) WITH PROPOFOL N/A 05/23/2019   Procedure: ESOPHAGOGASTRODUODENOSCOPY (EGD) WITH PROPOFOL;  Surgeon: West Bali, MD;  [facility]: [facility];  Service: Endoscopy;  Laterality: N/A;  1:45pm   ESOPHAGOGASTRODUODENOSCOPY (EGD) WITH PROPOFOL N/A 05/20/2020   patent tubular esophagus s/p dilation, single island of salmon-colored epithelium. Barrett's esophagus. 3 year surveillance.   ESOPHAGOGASTRODUODENOSCOPY (EGD) WITH PROPOFOL N/A 10/17/2020   food in upper third of esophagus, gastric bypass with normal pouch. GJ anastomosis normal appearing.   ESOPHAGOGASTRODUODENOSCOPY (EGD) WITH PROPOFOL N/A 12/30/2021   Procedure: ESOPHAGOGASTRODUODENOSCOPY (EGD) WITH PROPOFOL;  Surgeon: Lanelle Bal, DO;  [facility];  Service: Endoscopy;  Laterality: N/A;   FLEXIBLE SIGMOIDOSCOPY  12/30/2021   Procedure: FLEXIBLE SIGMOIDOSCOPY;  Surgeon: Lanelle Bal, DO;  [facility];  Service: Endoscopy;;   GASTRIC BYPASS  12/08/1979   HIP SURGERY Bilateral    fractured bilateral hips   INTRAMEDULLARY (IM) NAIL INTERTROCHANTERIC Right 03/08/2013   Procedure: INTRAMEDULLARY (IM) NAIL INTERTROCHANTRIC;  Surgeon: Vickki Hearing, MD;  Location: [facility];  Service: Orthopedics;  Laterality: Right;   MALONEY DILATION N/A 05/20/2020   Procedure: Elease Hashimoto DILATION;  Surgeon: Corbin Ade, MD;  Location: [facility];  Service: Endoscopy;  Laterality: N/A;   SAVORY DILATION N/A 03/04/2016   Procedure: SAVORY DILATION;  Surgeon: West Bali, MD;  [facility]: [facility];  Service: Endoscopy;  Laterality: N/A;   SAVORY DILATION N/A 05/23/2019   Procedure: SAVORY DILATION;  Surgeon: West Bali, MD;  [facility]: [facility];  Service: Endoscopy;  Laterality: N/A;    Prior to Admission medications   Medication Sig Start Date End Date Taking? Authorizing Provider  lubiprostone (AMITIZA) 24 MCG capsule Take 1 capsule (24 mcg total)  by mouth 2 (two) times daily with a meal. 01/11/24  Yes Gelene Mink, NP  omeprazole (PRILOSEC) 20 MG capsule TAKE 1 CAPSULE(20 MG) BY MOUTH TWICE DAILY BEFORE A MEAL 01/11/24  Yes Gelene Mink, NP  Oxycodone HCl 10 MG TABS Take 10 mg by mouth 3 (three) times daily as needed (pain). 03/09/20  Yes [provider]  Plecanatide (TRULANCE) 3 MG TABS Take 1 tablet (3 mg total) by mouth daily. 02/19/23  Yes Mahon, Frederik Schmidt, NP  promethazine (PHENERGAN) 12.5 MG tablet TAKE 1 TABLET BY MOUTH EVERY 6 TO 8 HOURS AS NEEDED FOR NAUSEA OR VOMITING 01/11/24  Yes Gelene Mink, NP  Propylene Glycol 0.6 % SOLN Place 1 drop into both eyes daily as needed (dry eyes).   Yes [provider]  denosumab (PROLIA) 60 MG/ML SOLN injection Inject 60 mg into the skin every 6 (six) months. Administer in upper arm, thigh, or abdomen    [provider]  diphenhydrAMINE (BENADRYL) 25 mg capsule Take 75 mg by mouth every 6 (six) hours as needed for itching. Takes 3 before and 3 after taking taking Oxycodone     [provider]    Allergies as of 02/22/2024 - Review Complete 01/11/2024  Allergen Reaction Noted   Amoxicillin-pot clavulanate Swelling 12/25/2011   Ceclor [cefaclor] Swelling 12/25/2011   Elemental sulfur Shortness Of Breath 12/25/2011   Morphine and codeine Other (See Comments) 12/25/2011   Azithromycin Other (See Comments) 12/26/2011   Flexeril [cyclobenzaprine] Hives and Swelling 02/20/2016   Ivp dye [iodinated contrast media] Other (See Comments) 12/25/2011   Methadone Other (See Comments) 12/25/2011   Percocet [oxycodone-acetaminophen] Itching 12/25/2011   Betadine [povidone iodine] Rash 02/18/2015   Codeine Hives and Rash 12/25/2011   Escitalopram oxalate Other (See Comments) 12/25/2011   Ibuprofen Hives and Rash 12/26/2011   Tylenol [acetaminophen] Hives, Nausea Only, and Rash 12/26/2011    Family History  Problem Relation Age of Onset   Throat cancer Mother    COPD  Father    Stomach cancer Sister    COPD Sister    Colon polyps Sister    COPD Sister    Colon cancer Neg Hx    Esophageal cancer Neg Hx     Social History   Socioeconomic History   Marital status: Widowed    Spouse name: Not on file   Number of children: Not on file   Years of education: Not on file   Highest education level: Not on file  Occupational History   Not on file  Tobacco Use   Smoking status: Never   Smokeless tobacco: Never  Vaping Use   Vaping status: Never Used  Substance and Sexual Activity   Alcohol use: No    Alcohol/week: 0.0 standard drinks of alcohol   Drug use: No   Sexual activity: Never    Birth control/protection: Surgical  Other Topics Concern   Not on file  Social History Narrative   Not on file   Social Drivers of Health   Financial Resource Strain: Not on file  Food Insecurity: Not on file  Transportation Needs: Not on file  Physical Activity: Not on file  Stress: Not on file  Social Connections: Not on file  Intimate Partner  Violence: Not on file    Review of Systems: General: Negative for fever, chills, fatigue, weakness. Eyes: Negative for vision changes.  ENT: Negative for hoarseness, difficulty swallowing , nasal congestion. CV: Negative for chest pain, angina, palpitations, dyspnea on exertion, peripheral edema.  Respiratory: Negative for dyspnea at rest, dyspnea on exertion, cough, sputum, wheezing.  GI: See history of present illness. GU:  Negative for dysuria, hematuria, urinary incontinence, urinary frequency, nocturnal urination.  MS: Negative for joint pain, low back pain.  Derm: Negative for rash or itching.  Neuro: Negative for weakness, abnormal sensation, seizure, frequent headaches, memory loss, confusion.  Psych: Negative for anxiety, depression Endo: Negative for unusual weight change.  Heme: Negative for bruising or bleeding. Allergy: Negative for rash or hives.  Physical Exam: Vital signs in last 24  hours: Temp:  [98 F (36.7 C)] 98 F (36.7 C) (04/08 1006) Pulse Rate:  [85] 85 (04/08 1006) Resp:  [14] 14 (04/08 1006) BP: (108)/(62) 108/62 (04/08 1006) SpO2:  [94 %] 94 % (04/08 1006) Weight:  [72.6 kg] 72.6 kg (04/08 1006)   General:   Alert,  Well-developed, well-nourished, pleasant and cooperative in NAD Head:  Normocephalic and atraumatic. Eyes:  Sclera clear, no icterus.   Conjunctiva pink. Ears:  Normal auditory acuity. Nose:  No deformity, discharge,  or lesions. Msk:  Symmetrical without gross deformities. Normal posture. Extremities:  Without clubbing or edema. Neurologic:  Alert and  oriented x4;  grossly normal neurologically. Skin:  Intact without significant lesions or rashes. Psych:  Alert and cooperative. Normal mood and affect.   Impression/Plan: Ellen Anderson is here for an EGD with possible dilation due to history of dysphagia, GERD  Risks, benefits, limitations, imponderables and alternatives regarding procedure have been reviewed with the patient. Questions have been answered. All parties agreeable.

## 2024-03-14 NOTE — Transfer of Care (Signed)
 Immediate Anesthesia Transfer of Care Note  Patient: Ellen Anderson  Procedure(s) Performed: EGD (ESOPHAGOGASTRODUODENOSCOPY) DILATION, ESOPHAGUS  Patient Location: PACU and Endoscopy Unit  Anesthesia Type:MAC  Level of Consciousness: awake and alert   Airway & Oxygen Therapy: Patient Spontanous Breathing and Patient connected to nasal cannula oxygen  Post-op Assessment: Report given to RN and Post -op Vital signs reviewed and stable  Post vital signs: Reviewed and stable  Last Vitals:  Vitals Value Taken Time  BP 117/49 03/14/24 1132  Temp 36.7 C 03/14/24 1132  Pulse 71 03/14/24 1132  Resp 12 03/14/24 1132  SpO2 92 % 03/14/24 1132    Last Pain:  Vitals:   03/14/24 1132  TempSrc: Oral  PainSc: 0-No pain      Patients Stated Pain Goal: 6 (03/14/24 1006)  Complications: No notable events documented.

## 2024-03-14 NOTE — Progress Notes (Signed)
 EKG pre op showing in and out of afib - rate is normal 70-80.  Dr. Johnnette Litter made aware ordered 12 lead.  12 lead showing normal sinus rhythm.

## 2024-03-14 NOTE — Discharge Instructions (Addendum)
 EGD Discharge instructions Please read the instructions outlined below and refer to this sheet in the next few weeks. These discharge instructions provide you with general information on caring for yourself after you leave the hospital. Your doctor may also give you specific instructions. While your treatment has been planned according to the most current medical practices available, unavoidable complications occasionally occur. If you have any problems or questions after discharge, please call your doctor. ACTIVITY You may resume your regular activity but move at a slower pace for the next 24 hours.  Take frequent rest periods for the next 24 hours.  Walking will help expel (get rid of) the air and reduce the bloated feeling in your abdomen.  No driving for 24 hours (because of the anesthesia (medicine) used during the test).  You may shower.  Do not sign any important legal documents or operate any machinery for 24 hours (because of the anesthesia used during the test).  NUTRITION Drink plenty of fluids.  You may resume your normal diet.  Begin with a light meal and progress to your normal diet.  Avoid alcoholic beverages for 24 hours or as instructed by your caregiver.  MEDICATIONS You may resume your normal medications unless your caregiver tells you otherwise.  WHAT YOU CAN EXPECT TODAY You may experience abdominal discomfort such as a feeling of fullness or "gas" pains.  FOLLOW-UP Your doctor will discuss the results of your test with you.  SEEK IMMEDIATE MEDICAL ATTENTION IF ANY OF THE FOLLOWING OCCUR: Excessive nausea (feeling sick to your stomach) and/or vomiting.  Severe abdominal pain and distention (swelling).  Trouble swallowing.  Temperature over 101 F (37.8 C).  Rectal bleeding or vomiting of blood.   Your upper endoscopy showed a tightening in the distal portion of your esophagus.  I stretched this out today.  Hopefully this helps with feeling of food getting stuck.   Also took samples of your esophagus.  We will call with these results.  Continue omeprazole twice daily.  Gastric pouch and small bowel looked normal.  I will have my office leave samples of Linzess for you at the front desk on [employer] [employer]  Follow-up in GI office in 6 to 8 weeks.   I hope you have a great rest of your week!  Hennie Duos. Marletta Lor, D.O. Gastroenterology and Hepatology [facility]

## 2024-03-14 NOTE — Anesthesia Preprocedure Evaluation (Signed)
 Anesthesia Evaluation  Patient identified by MRN, date of birth, ID band Patient awake    Reviewed: Allergy & Precautions, H&P , NPO status , Patient's Chart, lab work & pertinent test results, reviewed documented beta blocker date and time   History of Anesthesia Complications (+) PONV and history of anesthetic complications  Airway Mallampati: II  TM Distance: >3 FB Neck ROM: full    Dental no notable dental hx.    Pulmonary neg pulmonary ROS   Pulmonary exam normal breath sounds clear to auscultation       Cardiovascular Exercise Tolerance: Good hypertension,  Rhythm:regular Rate:Normal     Neuro/Psych Seizures -,   negative psych ROS   GI/Hepatic negative GI ROS, Neg liver ROS,,,  Endo/Other  negative endocrine ROS    Renal/GU negative Renal ROS  negative genitourinary   Musculoskeletal   Abdominal   Peds  Hematology  (+) Blood dyscrasia, anemia   Anesthesia Other Findings   Reproductive/Obstetrics negative OB ROS                             Anesthesia Physical Anesthesia Plan  ASA: 2  Anesthesia Plan: General   Post-op Pain Management:    Induction:   PONV Risk Score and Plan: Propofol infusion  Airway Management Planned:   Additional Equipment:   Intra-op Plan:   Post-operative Plan:   Informed Consent: I have reviewed the patients History and Physical, chart, labs and discussed the procedure including the risks, benefits and alternatives for the proposed anesthesia with the patient or authorized representative who has indicated his/her understanding and acceptance.     Dental Advisory Given  Plan Discussed with: CRNA  Anesthesia Plan Comments:        Anesthesia Quick Evaluation

## 2024-03-14 NOTE — Telephone Encounter (Signed)
 LMOM for pt to call office

## 2024-03-15 ENCOUNTER — Encounter (HOSPITAL_COMMUNITY): Payer: Self-pay | Admitting: Internal Medicine

## 2024-03-15 NOTE — Telephone Encounter (Signed)
 Pt was given samples of linzess 290 yesterday, will call with report in a week.

## 2024-03-16 LAB — SURGICAL PATHOLOGY

## 2024-03-17 NOTE — Anesthesia Postprocedure Evaluation (Signed)
 Anesthesia Post Note  Patient: Ellen Anderson  Procedure(s) Performed: EGD (ESOPHAGOGASTRODUODENOSCOPY) DILATION, ESOPHAGUS  Patient location during evaluation: Phase II Anesthesia Type: General Level of consciousness: awake Pain management: pain level controlled Vital Signs Assessment: post-procedure vital signs reviewed and stable Respiratory status: spontaneous breathing and respiratory function stable Cardiovascular status: blood pressure returned to baseline and stable Postop Assessment: no headache and no apparent nausea or vomiting Anesthetic complications: no Comments: Late entry   No notable events documented.   Last Vitals:  Vitals:   03/14/24 1006 03/14/24 1132  BP: 108/62 (!) 117/49  Pulse: 85 71  Resp: 14 12  Temp: 36.7 C 36.7 C  SpO2: 94% 92%    Last Pain:  Vitals:   03/14/24 1132  TempSrc: Oral  PainSc: 0-No pain                 Windell Norfolk

## 2024-04-25 ENCOUNTER — Ambulatory Visit (INDEPENDENT_AMBULATORY_CARE_PROVIDER_SITE_OTHER): Admitting: Gastroenterology

## 2024-04-25 ENCOUNTER — Encounter: Payer: Self-pay | Admitting: Gastroenterology

## 2024-04-25 VITALS — BP 117/75 | HR 84 | Temp 98.4°F | Ht 62.0 in | Wt 161.9 lb

## 2024-04-25 DIAGNOSIS — R1310 Dysphagia, unspecified: Secondary | ICD-10-CM

## 2024-04-25 DIAGNOSIS — K5903 Drug induced constipation: Secondary | ICD-10-CM

## 2024-04-25 DIAGNOSIS — T402X5A Adverse effect of other opioids, initial encounter: Secondary | ICD-10-CM | POA: Insufficient documentation

## 2024-04-25 DIAGNOSIS — Z860101 Personal history of adenomatous and serrated colon polyps: Secondary | ICD-10-CM | POA: Diagnosis not present

## 2024-04-25 MED ORDER — LINACLOTIDE 290 MCG PO CAPS
290.0000 ug | ORAL_CAPSULE | Freq: Every day | ORAL | 3 refills | Status: DC
Start: 2024-04-25 — End: 2024-04-25

## 2024-04-25 MED ORDER — NALOXEGOL OXALATE 12.5 MG PO TABS: 12.5000 mg | ORAL_TABLET | Freq: Every day | ORAL | 5 refills | Status: DC

## 2024-04-25 NOTE — Progress Notes (Signed)
 Gastroenterology Office Note     Primary Care Physician:  Vanita Gens, MD (Inactive)  Primary Gastroenterologist: Dr. Mordechai April    Chief Complaint   Chief Complaint  Patient presents with   Follow-up    Follow up EGD, constipation and Dysphagia     History of Present Illness   Ellen Anderson is a 78 y.o. female presenting today with a history of  GERD, Barrett's esophagus, dysphagia with multiple prior dilations, and chronic OIC with IBS.  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    Dysphagia: she notes improvement for a few weeks following dilation but then returned. She is taking omeprazole  BID.  Chronic constipation: has been on Trulance  in the past. Amitiza  caused N/V. Sent in Linzess again for trial. Stools are narrow and small "like a gherkin". Not taking Linzess as ran out of samples. With Linzess still had to take laxative 2-3 times per week. Stool softeners. BM would be 2-3 times per week. Hasn't taken it every day. Linzess helped push things through a bit. Was taking colace BID after running out of Linzess. Taking Benefiber every day.   Takes opioids every day. Deferring rectal exams.    Endoscopic procedures:  EGD April 2025: distal esophageal web s/p dilation, z line variable and biopsies, roux-en-y anatomy with patent anastomosis. Negative H>pylori.    Colonoscopy Jan 2023: inadequate prep, solid stool, non-bleeding internal hemorrhoids.  EGD Jan 2023: gastric bypass, GJ anastomosis with healthy mucosa. Empiric balloon dilation.   Colonoscopy 2017: adenomas  Past Medical History:  Diagnosis Date   Anemia    Chronic low back pain    Degenerative joint disease    Hip fracture, left (HCC) 03/12/2012   Hypertension    PONV (postoperative nausea and vomiting)     Past Surgical History:  Procedure Laterality Date   ABDOMINAL HYSTERECTOMY     ANKLE SURGERY Left    pins and screws   APPENDECTOMY     BACK  SURGERY     BALLOON DILATION N/A 12/30/2021   Procedure: BALLOON DILATION;  Surgeon: Vinetta Greening, DO;  [facility];  Service: Endoscopy;  Laterality: N/A;   BIOPSY  05/20/2020   Procedure: BIOPSY;  Surgeon: Suzette Espy, MD;  [facility];  Service: Endoscopy;;   CATARACT EXTRACTION W/PHACO Right 02/18/2015   Procedure: CATARACT EXTRACTION PHACO AND INTRAOCULAR LENS PLACEMENT RIGHT EYE; CDE:  10.48;  Surgeon: Anner Kill, MD;  Location: [facility];  Service: Ophthalmology;  Laterality: Right;   CATARACT EXTRACTION W/PHACO Left 03/07/2015   Procedure: CATARACT EXTRACTION PHACO AND INTRAOCULAR LENS PLACEMENT (IOC);  Surgeon: Anner Kill, MD;  Location: [facility];  Service: Ophthalmology;  Laterality: Left;  CDE:7.14   CHOLECYSTECTOMY     COLONOSCOPY N/A 03/04/2016   three polyps, hemorrhoids. Tubular adenoma. Colonoscopy 2022.   ESOPHAGEAL DILATION N/A 03/14/2024   Procedure: DILATION, ESOPHAGUS;  Surgeon: Vinetta Greening, DO;  [facility];  Service: Endoscopy;  Laterality: N/A;   ESOPHAGOGASTRODUODENOSCOPY  07/23/2004   Dr. 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 Nolene Baumgarten: food bolus which would not pass through gastric remnant into jejunostomy, PORK CHOP removed. AVOID PORK CHOPS   ESOPHAGOGASTRODUODENOSCOPY N/A 03/04/2016   Procedure: ESOPHAGOGASTRODUODENOSCOPY (EGD);  Surgeon: Alyce Jubilee, MD;  [facility];  Service: Endoscopy;  Laterality: N/A;   ESOPHAGOGASTRODUODENOSCOPY N/A 03/14/2024  Procedure: EGD (ESOPHAGOGASTRODUODENOSCOPY);  Surgeon: Vinetta Greening, DO;  [facility];  Service: Endoscopy;  Laterality: N/A;  11:00 am, asa 2   ESOPHAGOGASTRODUODENOSCOPY (EGD) WITH PROPOFOL  N/A 05/23/2019   Procedure: ESOPHAGOGASTRODUODENOSCOPY (EGD) WITH PROPOFOL ;  Surgeon: Alyce Jubilee, MD;  [facility];  Service: Endoscopy;   Laterality: N/A;  1:45pm   ESOPHAGOGASTRODUODENOSCOPY (EGD) WITH PROPOFOL  N/A 05/20/2020   patent tubular esophagus s/p dilation, single island of salmon-colored epithelium. Barrett's esophagus. 3 year surveillance.   ESOPHAGOGASTRODUODENOSCOPY (EGD) WITH PROPOFOL  N/A 10/17/2020   food in upper third of esophagus, gastric bypass with normal pouch. GJ anastomosis normal appearing.   ESOPHAGOGASTRODUODENOSCOPY (EGD) WITH PROPOFOL  N/A 12/30/2021   Procedure: ESOPHAGOGASTRODUODENOSCOPY (EGD) WITH PROPOFOL ;  Surgeon: Vinetta Greening, DO;  [facility];  Service: Endoscopy;  Laterality: N/A;   FLEXIBLE SIGMOIDOSCOPY  12/30/2021   Procedure: FLEXIBLE SIGMOIDOSCOPY;  Surgeon: Vinetta Greening, DO;  [facility];  Service: Endoscopy;;   GASTRIC BYPASS  12/08/1979   HIP SURGERY Bilateral    fractured bilateral hips   INTRAMEDULLARY (IM) NAIL INTERTROCHANTERIC Right 03/08/2013   Procedure: INTRAMEDULLARY (IM) NAIL INTERTROCHANTRIC;  Surgeon: Darrin Emerald, MD;  Location: [facility];  Service: Orthopedics;  Laterality: Right;   MALONEY DILATION N/A 05/20/2020   Procedure: Londa Rival DILATION;  Surgeon: Suzette Espy, MD;  [facility];  Service: Endoscopy;  Laterality: N/A;   SAVORY DILATION N/A 03/04/2016   Procedure: SAVORY DILATION;  Surgeon: Alyce Jubilee, MD;  [facility];  Service: Endoscopy;  Laterality: N/A;   SAVORY DILATION N/A 05/23/2019   Procedure: SAVORY DILATION;  Surgeon: Alyce Jubilee, MD;  [facility];  Service: Endoscopy;  Laterality: N/A;    Current Outpatient Medications  Medication Sig Dispense Refill   denosumab (PROLIA) 60 MG/ML SOLN injection Inject 60 mg into the skin every 6 (six) months. Administer in upper arm, thigh, or abdomen     diphenhydrAMINE  (BENADRYL ) 25 mg capsule Take 75 mg by mouth every 6 (six) hours as needed for itching. Takes 3 before and 3 after taking taking Oxycodone       omeprazole  (PRILOSEC) 20  MG capsule TAKE 1 CAPSULE(20 MG) BY MOUTH TWICE DAILY BEFORE A MEAL 180 capsule 3   Oxycodone  HCl 10 MG TABS Take 10 mg by mouth 3 (three) times daily as needed (pain).     promethazine  (PHENERGAN ) 12.5 MG tablet TAKE 1 TABLET BY MOUTH EVERY 6 TO 8 HOURS AS NEEDED FOR NAUSEA OR VOMITING 30 tablet 3   Propylene Glycol 0.6 % SOLN Place 1 drop into both eyes daily as needed (dry eyes).     No current facility-administered medications for this visit.    Allergies as of 04/25/2024 - Review Complete 04/25/2024  Allergen Reaction Noted   Amoxicillin-pot clavulanate Swelling 12/25/2011   Ceclor [cefaclor] Swelling 12/25/2011   Elemental sulfur Shortness Of Breath 12/25/2011   Morphine and codeine Other (See Comments) 12/25/2011   Azithromycin Other (See Comments) 12/26/2011   Flexeril [cyclobenzaprine] Hives and Swelling 02/20/2016   Ivp dye [iodinated contrast media] Other (See Comments) 12/25/2011   Methadone Other (See Comments) 12/25/2011   Percocet [oxycodone -acetaminophen ] Itching 12/25/2011   Betadine [povidone iodine] Rash 02/18/2015   Codeine Hives and Rash 12/25/2011   Escitalopram oxalate Other (See Comments) 12/25/2011   Ibuprofen Hives and Rash 12/26/2011   Tylenol  [acetaminophen ] Hives, Nausea Only, and Rash 12/26/2011    Family History  Problem Relation Age of Onset  Throat cancer Mother    COPD Father    Stomach cancer Sister    COPD Sister    Colon polyps Sister    COPD Sister    Colon cancer Neg Hx    Esophageal cancer Neg Hx     Social History   Socioeconomic History   Marital status: Widowed    Spouse name: Not on file   Number of children: Not on file   Years of education: Not on file   Highest education level: Not on file  Occupational History   Not on file  Tobacco Use   Smoking status: Never   Smokeless tobacco: Never  Vaping Use   Vaping status: Never Used  Substance and Sexual Activity   Alcohol use: No    Alcohol/week: 0.0 standard drinks of  alcohol   Drug use: No   Sexual activity: Never    Birth control/protection: Surgical  Other Topics Concern   Not on file  Social History Narrative   Not on file   Social Drivers of Health   Financial Resource Strain: Not on file  Food Insecurity: Not on file  Transportation Needs: Not on file  Physical Activity: Not on file  Stress: Not on file  Social Connections: Not on file  Intimate Partner Violence: Not on file     Review of Systems   Gen: Denies any fever, chills, fatigue, weight loss, lack of appetite.  CV: Denies chest pain, heart palpitations, peripheral edema, syncope.  Resp: Denies shortness of breath at rest or with exertion. Denies wheezing or cough.  GI: Denies dysphagia or odynophagia. Denies jaundice, hematemesis, fecal incontinence. GU : Denies urinary burning, urinary frequency, urinary hesitancy MS: Denies joint pain, muscle weakness, cramps, or limitation of movement.  Derm: Denies rash, itching, dry skin Psych: Denies depression, anxiety, memory loss, and confusion Heme: Denies bruising, bleeding, and enlarged lymph nodes.   Physical Exam   BP 117/75   Pulse 84   Temp 98.4 F (36.9 C)   Ht 5\' 2"  (1.575 m)   Wt 161 lb 14.4 oz (73.4 kg)   BMI 29.61 kg/m  General:   Alert and oriented. Pleasant and cooperative. Well-nourished and well-developed.  Head:  Normocephalic and atraumatic. Eyes:  Without icterus Abdomen:  +BS, soft, non-tender and non-distended. No HSM noted. No guarding or rebound. No masses appreciated.  Rectal:  Declined  Msk:  Symmetrical without gross deformities. Normal posture. Extremities:  Without edema. Neurologic:  Alert and  oriented x4;  grossly normal neurologically. Skin:  Intact without significant lesions or rashes. Psych:  Alert and cooperative. Normal mood and affect.   Assessment   ATALIA LITZINGER is a 78 y.o. female presenting today with a history of GERD, Barrett's esophagus, dysphagia with multiple prior  dilations, and chronic OIC with IBS.  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   Dysphagia: improved with dilation briefly but now returned. Could benefit from re-treatment. If no improvement thereafter, recommend BPE or manometry.   OIC: chronic opioids. Previously has tried Trulance  without improvement, Amitiza  with N/V, Linzess with some improvement but requiring multiple laxatives throughout the week and not ideal BMs.   Hx of inadequate prep in 2023 for colonoscopy: needs surveillance due to adenomas in 2017.      PLAN    Start Movantik 12.5 mg daily, increase if tolerated. Obtain outside labs to ensure GFR stable Proceed with colonoscopy/EGD/dilation by Dr. Mordechai April  in near future:  the risks, benefits, and alternatives have been discussed with the patient in detail. The patient states understanding and desires to proceed.  Return in August. Progress report prior to colonoscopy to ensure adequate BMs.    Delman Ferns, PhD, ANP-BC [facility]enter[facility]

## 2024-04-25 NOTE — Patient Instructions (Signed)
 I have sent in Movantik. We will start at the lower dosage and can increase if needed.  You will take this one hour before or 2 hours after eating. Please let me know if not covered by insurance!  We will arrange a colonoscopy, upper endoscopy, and dilation in the near future with Dr. Mordechai April (around end of July).  We will see you in August! Please keep me updated on your bowel movements so we can maximize what you are taking.  I enjoyed seeing you again today! I value our relationship and want to provide genuine, compassionate, and quality care. You may receive a survey regarding your visit with me, and I welcome your feedback! Thanks so much for taking the time to complete this. I look forward to seeing you again.      Delman Ferns, PhD, ANP-BC [facility] Gastroenterology

## 2024-05-30 ENCOUNTER — Other Ambulatory Visit: Payer: Self-pay | Admitting: *Deleted

## 2024-05-30 ENCOUNTER — Encounter: Payer: Self-pay | Admitting: *Deleted

## 2024-05-30 MED ORDER — SUTAB 1479-225-188 MG PO TABS: 12.0000 | ORAL_TABLET | ORAL | 0 refills | Status: AC

## 2024-06-23 ENCOUNTER — Inpatient Hospital Stay: Attending: Oncology | Admitting: Oncology

## 2024-06-23 ENCOUNTER — Inpatient Hospital Stay

## 2024-06-23 VITALS — BP 111/59 | HR 89 | Temp 98.2°F | Resp 18 | Wt 155.8 lb

## 2024-06-23 DIAGNOSIS — D649 Anemia, unspecified: Secondary | ICD-10-CM | POA: Insufficient documentation

## 2024-06-23 DIAGNOSIS — D709 Neutropenia, unspecified: Secondary | ICD-10-CM | POA: Diagnosis present

## 2024-06-23 DIAGNOSIS — Z79899 Other long term (current) drug therapy: Secondary | ICD-10-CM | POA: Diagnosis not present

## 2024-06-23 LAB — CBC WITH DIFFERENTIAL/PLATELET
Abs Immature Granulocytes: 0 K/uL (ref 0.00–0.07)
Basophils Absolute: 0 K/uL (ref 0.0–0.1)
Basophils Relative: 1 %
Eosinophils Absolute: 0.2 K/uL (ref 0.0–0.5)
Eosinophils Relative: 10 %
HCT: 27.5 % — ABNORMAL LOW (ref 36.0–46.0)
Hemoglobin: 8.2 g/dL — ABNORMAL LOW (ref 12.0–15.0)
Immature Granulocytes: 0 %
Lymphocytes Relative: 31 %
Lymphs Abs: 0.5 K/uL — ABNORMAL LOW (ref 0.7–4.0)
MCH: 31.1 pg (ref 26.0–34.0)
MCHC: 29.8 g/dL — ABNORMAL LOW (ref 30.0–36.0)
MCV: 104.2 fL — ABNORMAL HIGH (ref 80.0–100.0)
Monocytes Absolute: 0.5 K/uL (ref 0.1–1.0)
Monocytes Relative: 28 %
Neutro Abs: 0.5 K/uL — ABNORMAL LOW (ref 1.7–7.7)
Neutrophils Relative %: 30 %
Platelets: 310 K/uL (ref 150–400)
RBC: 2.64 MIL/uL — ABNORMAL LOW (ref 3.87–5.11)
RDW: 16.4 % — ABNORMAL HIGH (ref 11.5–15.5)
Smear Review: NORMAL
WBC: 1.7 K/uL — ABNORMAL LOW (ref 4.0–10.5)
nRBC: 0 % (ref 0.0–0.2)

## 2024-06-23 LAB — COMPREHENSIVE METABOLIC PANEL WITH GFR
ALT: 11 U/L (ref 0–44)
AST: 16 U/L (ref 15–41)
Albumin: 3.7 g/dL (ref 3.5–5.0)
Alkaline Phosphatase: 46 U/L (ref 38–126)
Anion gap: 12 (ref 5–15)
BUN: 11 mg/dL (ref 8–23)
CO2: 25 mmol/L (ref 22–32)
Calcium: 8.8 mg/dL — ABNORMAL LOW (ref 8.9–10.3)
Chloride: 102 mmol/L (ref 98–111)
Creatinine, Ser: 0.57 mg/dL (ref 0.44–1.00)
GFR, Estimated: >60 mL/min (ref >60)
Glucose, Bld: 94 mg/dL (ref 70–99)
Potassium: 3.6 mmol/L (ref 3.5–5.1)
Sodium: 139 mmol/L (ref 135–145)
Total Bilirubin: 0.6 mg/dL (ref 0.0–1.2)
Total Protein: 6.9 g/dL (ref 6.5–8.1)

## 2024-06-23 LAB — URIC ACID: Uric Acid, Serum: 4.3 mg/dL (ref 2.5–7.1)

## 2024-06-23 LAB — IRON AND TIBC
Iron: 56 ug/dL (ref 28–170)
Saturation Ratios: 14 % (ref 10.4–31.8)
TIBC: 390 ug/dL (ref 250–450)
UIBC: 334 ug/dL

## 2024-06-23 LAB — FERRITIN: Ferritin: 100 ng/mL (ref 11–307)

## 2024-06-23 LAB — LACTATE DEHYDROGENASE: LDH: 99 U/L (ref 98–192)

## 2024-06-23 LAB — FOLATE: Folate: 33.1 ng/mL (ref >5.9)

## 2024-06-23 LAB — VITAMIN B12: Vitamin B-12: 435 pg/mL (ref 180–914)

## 2024-06-23 NOTE — Patient Instructions (Signed)
 VISIT SUMMARY:  Today, you were seen for a follow-up regarding your low white blood cell count and fatigue. We discussed your symptoms, including dizziness, exhaustion, insomnia, and numbness in your hands and feet. We also reviewed your recent weight loss and current medications. Blood work was ordered to further investigate the underlying causes of your symptoms.  YOUR PLAN:  -LEUKOPENIA: Leukopenia means you have a low white blood cell count, which can be due to various reasons such as nutritional deficiencies, leukemia, or myelodysplastic syndromes (MDS). We suspect a nutritional deficiency due to your history of gastric bypass and vitamin D deficiency. We have ordered comprehensive blood work to assess your nutritional levels. If the cause remains unclear, we may need to consider a bone marrow biopsy. Please go to the emergency room if you develop a fever of 100.750F or higher.  -ANEMIA: Anemia means you have a lower than normal number of red blood cells, which can cause fatigue and weakness. This may be due to iron deficiency, especially considering your gastric bypass surgery. We have ordered blood work to check your iron levels and other potential causes of anemia. If iron deficiency is confirmed, we will evaluate the need for an IV iron infusion at the infusion center.  -NUTRITIONAL DEFICIENCY: A nutritional deficiency means your body is lacking essential vitamins and minerals, which can contribute to your low white blood cell count and anemia. We have ordered blood work to assess your nutritional status, including vitamin and mineral levels.  -PERIPHERAL NEUROPATHY: Peripheral neuropathy means you have numbness and tingling in your hands and feet, which can be related to nutritional deficiencies or anemia. We will evaluate for potential nutritional deficiencies that may be contributing to this condition.   INSTRUCTIONS:  Please complete the blood work as soon as possible. Follow up with us   once the results are available. If you develop a fever of 100.750F or higher, go to the emergency room immediately.

## 2024-06-23 NOTE — Progress Notes (Addendum)
 [facility]  HEMATOLOGY NEW VISIT  [name], MD (Inactive)  REASON FOR REFERRAL: Leukopenia  HISTORY OF PRESENT ILLNESS: Ellen Anderson 78 y.o. female referred for neutropenia and anemia.  Patient reports that she experiences dizziness and exhaustion, with difficulty moving her feet.  She struggles with insomnia, often staying up all night reading, but does not fall asleep while reading or watching TV.  No recent fevers, chills, urinary infections, or abdominal pain. She maintains a good appetite but has lost five pounds over the last three weeks. She experiences numbness in her hands and fingertips, which has recently extended to her feet. She applies Voltaren to her hip and back for relief.  She denies any other complaints today.  She is a non-smoker, does not drink alcohol.  Has no known family history of blood disorders.   I have reviewed the past medical history, past surgical history, social history and family history with the patient   ALLERGIES:  is allergic to amoxicillin-pot clavulanate, ceclor [cefaclor], elemental sulfur, morphine and codeine, azithromycin, flexeril [cyclobenzaprine], ivp dye [iodinated contrast media], methadone, percocet [oxycodone -acetaminophen ], betadine [povidone iodine], codeine, escitalopram oxalate, ibuprofen, and tylenol  [acetaminophen ].  MEDICATIONS:  Current Outpatient Medications  Medication Sig Dispense Refill   denosumab (PROLIA) 60 MG/ML SOLN injection Inject 60 mg into the skin every 6 (six) months. Administer in upper arm, thigh, or abdomen     diphenhydrAMINE  (BENADRYL ) 25 mg capsule Take 75 mg by mouth every 6 (six) hours as needed for itching. Takes 3 before and 3 after taking taking Oxycodone       FEROSUL 325 (65 Fe) MG tablet Take 325 mg by mouth daily.     folic acid (FOLVITE) 1 MG tablet Take 1 tablet every day by oral route.     furosemide (LASIX) 20 MG tablet TAKE 1 TABLET BY MOUTH ONCE A DAY  WITH A MEAL FOR SWELLING     naloxegol  oxalate (MOVANTIK ) 12.5 MG TABS tablet Take 1 tablet (12.5 mg total) by mouth daily. 1 hour before or 2 hours after a meal. 30 tablet 5   omeprazole  (PRILOSEC) 20 MG capsule TAKE 1 CAPSULE(20 MG) BY MOUTH TWICE DAILY BEFORE A MEAL 180 capsule 3   Oxycodone  HCl 10 MG TABS Take 10 mg by mouth 3 (three) times daily as needed (pain).     promethazine  (PHENERGAN ) 12.5 MG tablet TAKE 1 TABLET BY MOUTH EVERY 6 TO 8 HOURS AS NEEDED FOR NAUSEA OR VOMITING 30 tablet 3   Propylene Glycol 0.6 % SOLN Place 1 drop into both eyes daily as needed (dry eyes).     Sodium Sulfate-Mag Sulfate-KCl (SUTAB ) [phone] MG TABS Take 12 tablets by mouth as directed. 24 tablet 0   LINZESS  290 MCG CAPS capsule Take 290 mcg by mouth daily.     oxybutynin (DITROPAN) 5 MG tablet TAKE 1 TABLET BY MOUTH TWICE DAILY FOR BLADDER     No current facility-administered medications for this visit.     REVIEW OF SYSTEMS:   Constitutional: Denies fevers, chills or night sweats Eyes: Denies blurriness of vision Ears, nose, mouth, throat, and face: Denies mucositis or sore throat Respiratory: Denies cough, dyspnea or wheezes Cardiovascular: Denies palpitation, chest discomfort or lower extremity swelling Gastrointestinal:  Denies nausea, heartburn or change in bowel habits Skin: Denies abnormal skin rashes Lymphatics: Denies new lymphadenopathy or easy bruising Neurological:Denies numbness, tingling or new weaknesses Behavioral/Psych: Mood is stable, no new changes  All other systems were reviewed with  the patient and are negative.  PHYSICAL EXAMINATION:   Vitals:   06/23/24 0922 06/23/24 0927  BP: (!) 154/103 (!) 111/59  Pulse: 89   Resp: 18   Temp: 98.2 F (36.8 C)   SpO2: 96%     GENERAL:alert, no distress and comfortable SKIN: skin color, texture, turgor are normal, no rashes or significant lesions LYMPH:  no palpable lymphadenopathy in the cervical, axillary or  inguinal LUNGS: clear to auscultation and percussion with normal breathing effort HEART: regular rate & rhythm and no murmurs and no lower extremity edema ABDOMEN:abdomen soft, non-tender and normal bowel sounds Musculoskeletal:no cyanosis of digits and no clubbing  NEURO: alert & oriented x 3 with fluent speech  LABORATORY DATA:  I have reviewed the data as listed and reviewed labs from primary care drawn on 06/17/2024 CMP: WNL CBC: WBC: 1.9, hemoglobin: 9, MCV: 101, hematocrit: 29, platelets: 431 ANC: 300, ALC: 800  Lab Results  Component Value Date   WBC 1.7 (L) 06/23/2024   NEUTROABS 0.5 (L) 06/23/2024   HGB 8.2 (L) 06/23/2024   HCT 27.5 (L) 06/23/2024   MCV 104.2 (H) 06/23/2024   PLT 310 06/23/2024      Chemistry      Component Value Date/Time   NA 139 06/23/2024 0953   K 3.6 06/23/2024 0953   CL 102 06/23/2024 0953   CO2 25 06/23/2024 0953   BUN 11 06/23/2024 0953   CREATININE 0.57 06/23/2024 0953      Component Value Date/Time   CALCIUM 8.8 (L) 06/23/2024 0953   ALKPHOS 46 06/23/2024 0953   AST 16 06/23/2024 0953   ALT 11 06/23/2024 0953   BILITOT 0.6 06/23/2024 0953       RADIOGRAPHIC STUDIES: I have personally reviewed the radiological images as listed and agreed with the findings in the report.  None to review  ASSESSMENT & PLAN:  Patient is a 78 y.o. female referred for neutropenia  Assessment & Plan Neutropenia, unspecified type (HCC) Critically low white blood cell count. Differential includes nutritional deficiency, leukemia, or MDS. Nutritional deficiency suspected due to gastric bypass   - Ordered comprehensive blood work to assess nutritional levels. - Will also obtain labs including LDH, uric acid, flow cytometry. - Will review peripheral smear today. - Discussed potential need for bone marrow biopsy if etiology remains unclear. - Advise to go to the emergency room if fever of 100.48F or higher develops. Anemia, unspecified type Chronic  anemia with possible iron deficiency due to gastric bypass and potential malabsorption.  Scheduled for colonoscopy in September.  - Ordered blood work to assess iron levels and other potential causes of anemia. - Evaluate need for IV iron infusion at the infusion center if iron deficiency is confirmed.  Return to clinic in 2 weeks or sooner to discuss results and further management  Addendum: Peripheral smear reviewed: WBCs: Normal looking cells, no mild nucleated neutrophils seen, occasional premature cells.  No blasts seen.  RBCs: Some microcytic cells seen, polychromasia present.  Platelets: Increased in number, occasional big platelets seen.    Orders Placed This Encounter  Procedures   CBC with Differential/Platelet    Standing Status:   Future    Number of Occurrences:   1    Expected Date:   06/23/2024    Expiration Date:   09/21/2024   Comprehensive metabolic panel with GFR    Standing Status:   Future    Number of Occurrences:   1    Expected Date:  06/23/2024    Expiration Date:   09/21/2024   Ferritin    Standing Status:   Future    Number of Occurrences:   1    Expected Date:   06/23/2024    Expiration Date:   09/21/2024   Folate    Standing Status:   Future    Number of Occurrences:   1    Expected Date:   06/23/2024    Expiration Date:   09/21/2024   Vitamin B12    Standing Status:   Future    Number of Occurrences:   1    Expected Date:   06/23/2024    Expiration Date:   09/21/2024   Lactate dehydrogenase    Standing Status:   Future    Number of Occurrences:   1    Expected Date:   06/23/2024    Expiration Date:   09/21/2024   Haptoglobin    Standing Status:   Future    Number of Occurrences:   1    Expected Date:   06/23/2024    Expiration Date:   09/21/2024   Uric acid    Standing Status:   Future    Number of Occurrences:   1    Expected Date:   06/23/2024    Expiration Date:   09/21/2024   Iron and TIBC    Standing Status:   Future    Number of  Occurrences:   1    Expected Date:   06/23/2024    Expiration Date:   09/21/2024   Copper, serum    Standing Status:   Future    Number of Occurrences:   1    Expected Date:   06/23/2024    Expiration Date:   09/21/2024   Flow Cytometry, Peripheral Blood (Oncology)    Standing Status:   Future    Number of Occurrences:   1    Expected Date:   06/23/2024    Expiration Date:   09/21/2024    The total time spent in the appointment was 60 minutes encounter with patients including review of chart and various tests results, discussions about plan of care and coordination of care plan   All questions were answered. The patient knows to call the clinic with any problems, questions or concerns. No barriers to learning was detected.   Mickiel Dry, MD 7/18/202512:29 PM

## 2024-06-23 NOTE — Assessment & Plan Note (Signed)
 Chronic anemia with possible iron deficiency due to gastric bypass and potential malabsorption.  Scheduled for colonoscopy in September.  - Ordered blood work to assess iron levels and other potential causes of anemia. - Evaluate need for IV iron infusion at the infusion center if iron deficiency is confirmed.

## 2024-06-23 NOTE — Assessment & Plan Note (Signed)
 Critically low white blood cell count. Differential includes nutritional deficiency, leukemia, or MDS. Nutritional deficiency suspected due to gastric bypass   - Ordered comprehensive blood work to assess nutritional levels. - Will also obtain labs including LDH, uric acid, flow cytometry. - Will review peripheral smear today. - Discussed potential need for bone marrow biopsy if etiology remains unclear. - Advise to go to the emergency room if fever of 100.46F or higher develops.

## 2024-06-24 LAB — HAPTOGLOBIN: Haptoglobin: 129 mg/dL (ref 42–346)

## 2024-06-27 ENCOUNTER — Ambulatory Visit: Payer: Self-pay | Admitting: *Deleted

## 2024-06-27 ENCOUNTER — Other Ambulatory Visit: Payer: Self-pay | Admitting: *Deleted

## 2024-06-27 LAB — COPPER, SERUM: Copper: <5 ug/dL — ABNORMAL LOW (ref 80–158)

## 2024-06-27 MED ORDER — COPPER 2 MG PO TABS: 2.0000 mg | Freq: Every day | 0 refills | Status: DC

## 2024-06-27 NOTE — Progress Notes (Signed)
 Patient made aware.  Will send script and patient to advise if she has difficulty getting filled.

## 2024-06-29 ENCOUNTER — Other Ambulatory Visit: Payer: Self-pay | Admitting: *Deleted

## 2024-06-30 ENCOUNTER — Other Ambulatory Visit: Payer: Self-pay

## 2024-07-03 ENCOUNTER — Telehealth: Payer: Self-pay

## 2024-07-03 ENCOUNTER — Telehealth: Payer: Self-pay | Admitting: *Deleted

## 2024-07-03 NOTE — Telephone Encounter (Signed)
 RICK Kung,  Pt called 3 times but left no message so I called the number back and was advised by her that she went to her PCP and the told her she was anemic and [facility] are [facility], states she is too weak to go through a colonoscopy at this time. Transferred the pt to the schedulers to cancel / reschedule appt

## 2024-07-03 NOTE — Telephone Encounter (Signed)
 LMTRC  Pt left vm stating she needed to reschedule her procedure on 07/06/24.

## 2024-07-03 NOTE — Telephone Encounter (Signed)
 Spoke with pt. She did not want to reschedule her procedure at this time. Reports she is too weak and knows she can't prep. Message sent to endo to cancel.

## 2024-07-05 LAB — SURGICAL PATHOLOGY

## 2024-07-05 LAB — FLOW CYTOMETRY

## 2024-07-06 ENCOUNTER — Ambulatory Visit (HOSPITAL_COMMUNITY): Admission: RE | Admit: 2024-07-06 | Source: Home / Self Care | Admitting: Internal Medicine

## 2024-07-06 ENCOUNTER — Encounter (HOSPITAL_COMMUNITY): Payer: Self-pay | Admitting: Anesthesiology

## 2024-07-06 ENCOUNTER — Encounter (HOSPITAL_COMMUNITY): Admission: RE | Payer: Self-pay | Source: Home / Self Care

## 2024-07-06 SURGERY — COLONOSCOPY
Anesthesia: Choice

## 2024-07-07 ENCOUNTER — Inpatient Hospital Stay: Attending: Oncology | Admitting: Oncology

## 2024-07-07 VITALS — BP 95/59 | HR 79 | Temp 98.1°F | Resp 16 | Wt 151.7 lb

## 2024-07-07 DIAGNOSIS — Z79899 Other long term (current) drug therapy: Secondary | ICD-10-CM | POA: Insufficient documentation

## 2024-07-07 DIAGNOSIS — E610 Copper deficiency: Secondary | ICD-10-CM | POA: Diagnosis not present

## 2024-07-07 DIAGNOSIS — D709 Neutropenia, unspecified: Secondary | ICD-10-CM | POA: Diagnosis present

## 2024-07-07 DIAGNOSIS — D649 Anemia, unspecified: Secondary | ICD-10-CM | POA: Diagnosis not present

## 2024-07-07 NOTE — Assessment & Plan Note (Addendum)
 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  - Start copper  2 mg daily. - Restrict zinc supplementation

## 2024-07-07 NOTE — Assessment & Plan Note (Addendum)
 Likely secondary to copper  and iron deficiency Patient is due for colonoscopy and has postponed it. Recent endoscopy with no evidence of bleeding Patient is taking daily ferrous sulfate  -We discussed some of the risks, benefits, and alternatives of intravenous iron infusions. The patient is symptomatic from anemia and the iron level is critically low. She tolerated oral iron supplement poorly and desires to achieve higher levels of iron faster for adequate hematopoesis. Some of the side-effects to be expected including risks of infusion reactions, phlebitis, headaches, nausea and fatigue.  The patient is willing to proceed. Patient education material was dispensed. Goal is to keep ferritin level greater than 50 and resolution of anemia -Continue oral iron every other day.  Use MiraLAX for constipation -Start copper  supplementation 2 mg daily - Follow-up with GI for colonoscopy  Return to clinic in 2 months with labs

## 2024-07-07 NOTE — Progress Notes (Signed)
 [facility] at [facility]  HEMATOLOGY FOLLOW-UP VISIT  [name], MD (Inactive)  REASON FOR FOLLOW-UP: Leukopenia and anemia  ASSESSMENT & PLAN:  Patient is a 78 y.o. female following for leukopenia and anemia  Assessment & Plan Neutropenia, unspecified type (HCC) Likely secondary to copper  deficiency Flow cytometry: Negative Neutrophils slightly improved on repeat blood draw  - Start taking copper  2 mg daily - Avoid zinc supplements - Educated patient to go to the ER with a fever of 100.4 or more  Return to clinic in 2 months with labs Anemia, unspecified type Likely secondary to copper  and iron deficiency Patient is due for colonoscopy and has postponed it. Recent endoscopy with no evidence of bleeding Patient is taking daily ferrous sulfate  -We discussed some of the risks, benefits, and alternatives of intravenous iron infusions. The patient is symptomatic from anemia and the iron level is critically low. She tolerated oral iron supplement poorly and desires to achieve higher levels of iron faster for adequate hematopoesis. Some of the side-effects to be expected including risks of infusion reactions, phlebitis, headaches, nausea and fatigue.  The patient is willing to proceed. Patient education material was dispensed. Goal is to keep ferritin level greater than 50 and resolution of anemia -Continue oral iron every other day.  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  - Start copper  2 mg daily. - Restrict zinc supplementation    Orders Placed This Encounter  Procedures   CBC with Differential/Platelet    Standing Status:   Future    Expected Date:   09/04/2024    Expiration Date:    12/03/2024   Comprehensive metabolic panel with GFR    Standing Status:   Future    Expected Date:   09/04/2024    Expiration Date:   12/03/2024   Ferritin    Standing Status:   Future    Expected Date:   09/04/2024    Expiration Date:   12/03/2024   Folate    Standing Status:   Future    Expected Date:   09/04/2024    Expiration Date:   12/03/2024   Vitamin B12    Standing Status:   Future    Expected Date:   09/04/2024    Expiration Date:   12/03/2024   Iron and TIBC    Standing Status:   Future    Expected Date:   09/04/2024    Expiration Date:   12/03/2024   Copper , serum    Standing Status:   Future    Expected Date:   09/04/2024    Expiration Date:   12/03/2024   Zinc    Standing Status:   Future    Expected Date:   09/04/2024    Expiration Date:   12/03/2024    The total time spent in the appointment was 20 minutes encounter with patients including review of chart and various tests results, discussions about plan of care and coordination of care plan   All questions were answered. The patient knows to call the clinic with any problems, questions or concerns. No barriers to learning was detected.  Mickiel Dry, MD 8/1/20259:55 AM    INTERVAL HISTORY: Ellen Anderson 78 y.o. female following for neutropenia and anemia. Patient was  accompanied by her sister today.  Patient reports some pedal edema today that resolves with elevation of the leg.  She also reports numbness and tingling in her both hands.  She is feeling significantly better from the last visit.  She has no other complaints today.   I have reviewed the past medical history, past surgical history, social history and family history with the patient   ALLERGIES:  is allergic to amoxicillin-pot clavulanate, ceclor [cefaclor], elemental sulfur, morphine and codeine, azithromycin, flexeril [cyclobenzaprine], ivp dye [iodinated contrast media], methadone, percocet [oxycodone -acetaminophen ], betadine [povidone iodine],  codeine, escitalopram oxalate, ibuprofen, and tylenol  [acetaminophen ].  MEDICATIONS:  Current Outpatient Medications  Medication Sig Dispense Refill   denosumab (PROLIA) 60 MG/ML SOLN injection Inject 60 mg into the skin every 6 (six) months. Administer in upper arm, thigh, or abdomen     diphenhydrAMINE  (BENADRYL ) 25 mg capsule Take 75 mg by mouth every 6 (six) hours as needed for itching. Takes 3 before and 3 after taking taking Oxycodone       FEROSUL 325 (65 Fe) MG tablet Take 325 mg by mouth daily.     folic acid  (FOLVITE ) 1 MG tablet Take 1 tablet every day by oral route.     furosemide (LASIX) 20 MG tablet TAKE 1 TABLET BY MOUTH ONCE A DAY WITH A MEAL FOR SWELLING     LINZESS  290 MCG CAPS capsule Take 290 mcg by mouth daily.     Multiple Vitamin (MULTIVITAMIN) tablet Take 1 tablet by mouth daily. Contains copper  and no zinc     naloxegol  oxalate (MOVANTIK ) 12.5 MG TABS tablet Take 1 tablet (12.5 mg total) by mouth daily. 1 hour before or 2 hours after a meal. 30 tablet 5   omeprazole  (PRILOSEC) 20 MG capsule TAKE 1 CAPSULE(20 MG) BY MOUTH TWICE DAILY BEFORE A MEAL 180 capsule 3   oxybutynin (DITROPAN) 5 MG tablet TAKE 1 TABLET BY MOUTH TWICE DAILY FOR BLADDER     Oxycodone  HCl 10 MG TABS Take 10 mg by mouth 3 (three) times daily as needed (pain).     promethazine  (PHENERGAN ) 12.5 MG tablet TAKE 1 TABLET BY MOUTH EVERY 6 TO 8 HOURS AS NEEDED FOR NAUSEA OR VOMITING 30 tablet 3   Propylene Glycol 0.6 % SOLN Place 1 drop into both eyes daily as needed (dry eyes).     Sodium Sulfate-Mag Sulfate-KCl (SUTAB ) [phone] MG TABS Take 12 tablets by mouth as directed. 24 tablet 0   No current facility-administered medications for this visit.     REVIEW OF SYSTEMS:   Constitutional: Denies fevers, chills or night sweats Eyes: Denies blurriness of vision Ears, nose, mouth, throat, and face: Denies mucositis or sore throat Respiratory: Denies cough, dyspnea or wheezes Cardiovascular: Denies  palpitation, chest discomfort or lower extremity swelling Gastrointestinal:  Denies nausea, heartburn or change in bowel habits Skin: Denies abnormal skin rashes Lymphatics: Denies new lymphadenopathy or easy bruising Neurological:Denies numbness, tingling or new weaknesses Behavioral/Psych: Mood is stable, no new changes  All other systems were reviewed with the patient and are negative.  PHYSICAL EXAMINATION:   Vitals:   07/07/24 0844  BP: (!) 95/59  Pulse: 79  Resp: 16  Temp: 98.1 F (36.7 C)  SpO2: 99%    GENERAL:alert, no distress and comfortable SKIN: skin color, texture, turgor are normal, no rashes or significant lesions LUNGS: clear to auscultation and percussion with normal breathing effort HEART: regular rate & rhythm and no murmurs and no lower extremity edema ABDOMEN:abdomen soft, non-tender and  normal bowel sounds Musculoskeletal:no cyanosis of digits and no clubbing  NEURO: alert & oriented x 3 with fluent speech  LABORATORY DATA:  I have reviewed the data as listed  Lab Results  Component Value Date   WBC 1.7 (L) 06/23/2024   NEUTROABS 0.5 (L) 06/23/2024   HGB 8.2 (L) 06/23/2024   HCT 27.5 (L) 06/23/2024   MCV 104.2 (H) 06/23/2024   PLT 310 06/23/2024       Chemistry      Component Value Date/Time   NA 139 06/23/2024 0953   K 3.6 06/23/2024 0953   CL 102 06/23/2024 0953   CO2 25 06/23/2024 0953   BUN 11 06/23/2024 0953   CREATININE 0.57 06/23/2024 0953      Component Value Date/Time   CALCIUM 8.8 (L) 06/23/2024 0953   ALKPHOS 46 06/23/2024 0953   AST 16 06/23/2024 0953   ALT 11 06/23/2024 0953   BILITOT 0.6 06/23/2024 0953       Latest Reference Range & Units 06/23/24 09:53  Iron 28 - 170 ug/dL 56  UIBC ug/dL 665  TIBC 749 - 549 ug/dL 609  Saturation Ratios 10.4 - 31.8 % 14  Ferritin 11 - 307 ng/mL 100  Folate >5.9 ng/mL 33.1  Copper  80 - 158 ug/dL <5 (L)  Vitamin B12 819 - 914 pg/mL 435  (L): Data is abnormally low  FLOW  CYTOMETRY: 06/23/24:  DIAGNOSIS:   Peripheral blood, flow cytometry:  -  No immunophenotypic evidence of a lymphoproliferative disorder (i.e.  no monoclonal B cells or immunophenotypically abnormal T cells  detected).   GATING AND PHENOTYPIC ANALYSIS:   Gated population: Flow cytometric immunophenotyping is performed using  antibodies to the antigens listed in the table below. 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 Dilated.  - Z- line variable. Biopsied.  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  - Normal examined jejunum.  Colonoscopy: 12/30/2021: Impression:  - Preparation of the colon was inadequate.  - Non- bleeding internal hemorrhoids.  - Stool in the recto- sigmoid colon and in the sigmoid colon.  - No specimens collected.  RADIOGRAPHIC STUDIES: I have personally reviewed the radiological images as listed and agreed with the findings in the report.  None new to review

## 2024-07-07 NOTE — Assessment & Plan Note (Addendum)
 Likely secondary to copper  deficiency Flow cytometry: Negative Neutrophils slightly improved on repeat blood draw  - Start taking copper  2 mg daily - Avoid zinc supplements - Educated patient to go to the ER with a fever of 100.4 or more  Return to clinic in 2 months with labs

## 2024-07-10 ENCOUNTER — Encounter: Payer: Self-pay | Admitting: Oncology

## 2024-07-13 ENCOUNTER — Inpatient Hospital Stay

## 2024-07-13 VITALS — BP 110/67 | HR 91 | Temp 97.7°F | Resp 18

## 2024-07-13 DIAGNOSIS — D649 Anemia, unspecified: Secondary | ICD-10-CM

## 2024-07-13 MED ORDER — SODIUM CHLORIDE 0.9 % IV SOLN: INTRAVENOUS | Status: DC

## 2024-07-13 MED ORDER — SODIUM CHLORIDE 0.9 % IV SOLN
1000.0000 mg | Freq: Once | INTRAVENOUS | Status: AC
  Administered 2024-07-13: 1000 mg via INTRAVENOUS
  Filled 2024-07-13: qty 1000

## 2024-07-13 MED ORDER — CETIRIZINE HCL 10 MG/ML IV SOLN
5.0000 mg | Freq: Once | INTRAVENOUS | Status: AC
  Administered 2024-07-13: 5 mg via INTRAVENOUS
  Filled 2024-07-13: qty 1

## 2024-07-13 NOTE — Progress Notes (Signed)
 Patient presents today for 1st Monoferric  infusion. MAR reviewed and updated. Vital signs stable. Patient has no concerns related to iron infusion today. Information sheet given to patient with side effects and when to call the physician.   Monoferric  given today per MD orders. Tolerated infusion without adverse affects. Vital signs stable. No complaints at this time. Discharged from clinic bye wheel chair in stable condition. Alert and oriented x 3. F/U with [facility] as scheduled.

## 2024-07-13 NOTE — Patient Instructions (Addendum)
 [facility] [place] - A DEPT OF . [facility]  Discharge Instructions: Thank you for choosing [facility] to provide your oncology and hematology care.  If you have a lab appointment with the Cancer Center - please note that after April 8th, 2024, all labs will be drawn in the cancer center.  You do not have to check in or register with the main entrance as you have in the past but will complete your check-in in the cancer center.  Wear comfortable clothing and clothing appropriate for easy access to any Portacath or PICC line.   We strive to give you quality time with your provider. You may need to reschedule your appointment if you arrive late (15 or more minutes).  Arriving late affects you and other patients whose appointments are after yours.  Also, if you miss three or more appointments without notifying the office, you may be dismissed from the clinic at the provider's discretion.      For prescription refill requests, have your pharmacy contact our office and allow 72 hours for refills to be completed.    Today you received the following chemotherapy and/or immunotherapy agents Monoferric .  Ferric Derisomaltose  Injection What is this medication? FERRIC DERISOMALTOSE  (FER ik der EYE soe MAWL tose) treats low levels of iron in your body (iron deficiency anemia). Iron is a mineral that plays an important role in making red blood cells, which carry oxygen from your lungs to the rest of your body. This medicine may be used for other purposes; ask your health care provider or pharmacist if you have questions. COMMON BRAND NAME(S): MONOFERRIC  What should I tell my care team before I take this medication? They need to know if you have any of these conditions: High levels of iron in the blood An unusual or allergic reaction to iron, other medications, foods, dyes, or preservatives Pregnant or trying to get pregnant Breastfeeding How should I use this  medication? This medication is injected into a vein. It is given by your care team in a hospital or clinic setting. Talk to your care team about the use of this medication in children. Special care may be needed. Overdosage: If you think you have taken too much of this medicine contact a poison control center or emergency room at once. NOTE: This medicine is only for you. Do not share this medicine with others. What if I miss a dose? It is important not to miss your dose. Call your care team if you are unable to keep an appointment. What may interact with this medication? Do not take this medication with any of the following: Deferoxamine Dimercaprol  Other iron products This list may not describe all possible interactions. Give your health care provider a list of all the medicines, herbs, non-prescription drugs, or dietary supplements you use. Also tell them if you smoke, drink alcohol, or use illegal drugs. Some items may interact with your medicine. What should I watch for while using this medication? Visit your care team for regular checks on your progress. Tell your care team if your symptoms do not start to get better or if they get worse. You may need blood work done while you are taking this medication. You may need to eat more foods that contain iron. Talk to your care team. Foods that contain iron include whole grains or cereals, dried fruits, beans, peas, leafy green vegetables, and organ meats (liver, kidney). What side effects may I notice from receiving  this medication? Side effects that you should report to your care team as soon as possible: Allergic reactions--skin rash, itching, hives, swelling of the face, lips, tongue, or throat Low blood pressure--dizziness, feeling faint or lightheaded, blurry vision Shortness of breath Side effects that usually do not require medical attention (report to your care team if they continue or are bothersome): Flushing Headache Joint  pain Muscle pain Nausea Pain, redness, or irritation at injection site This list may not describe all possible side effects. Call your doctor for medical advice about side effects. You may report side effects to FDA at 1-800-FDA-1088. Where should I keep my medication? This medication is given in a hospital or clinic. It will not be stored at home. NOTE: This sheet is a summary. It may not cover all possible information. If you have questions about this medicine, talk to your doctor, pharmacist, or health care provider.  2024 Elsevier/Gold Standard (2023-07-14 00:00:00)      To help prevent nausea and vomiting after your treatment, we encourage you to take your nausea medication as directed.  BELOW ARE SYMPTOMS THAT SHOULD BE REPORTED IMMEDIATELY: *FEVER GREATER THAN 100.4 F (38 C) OR HIGHER *CHILLS OR SWEATING *NAUSEA AND VOMITING THAT IS NOT CONTROLLED WITH YOUR NAUSEA MEDICATION *UNUSUAL SHORTNESS OF BREATH *UNUSUAL BRUISING OR BLEEDING *URINARY PROBLEMS (pain or burning when urinating, or frequent urination) *BOWEL PROBLEMS (unusual diarrhea, constipation, pain near the anus) TENDERNESS IN MOUTH AND THROAT WITH OR WITHOUT PRESENCE OF ULCERS (sore throat, sores in mouth, or a toothache) UNUSUAL RASH, SWELLING OR PAIN  UNUSUAL VAGINAL DISCHARGE OR ITCHING   Items with * indicate a potential emergency and should be followed up as soon as possible or go to the Emergency Department if any problems should occur.  Please show the CHEMOTHERAPY ALERT CARD or IMMUNOTHERAPY ALERT CARD at check-in to the Emergency Department and triage nurse.  Should you have questions after your visit or need to cancel or reschedule your appointment, please contact [facility] [place] - A DEPT OF [facility] [phone]  and follow the prompts.  Office hours are 8:00 a.m. to 4:30 p.m. Monday - Friday. Please note that voicemails left after 4:00 p.m. may not be returned until the  following business day.  We are closed weekends and major holidays. You have access to a nurse at all times for urgent questions. Please call the main number to the clinic [phone] and follow the prompts.  For any non-urgent questions, you may also contact your provider using MyChart. We now offer e-Visits for anyone 43 and older to request care online for non-urgent symptoms. For details visit [email or web address].   Also download the MyChart app! Go to the app store, search MyChart, open the app, select [facility], and log in with your MyChart username and password.  Ferric Derisomaltose  Injection What is this medication? FERRIC DERISOMALTOSE  (FER ik der EYE soe MAWL tose) treats low levels of iron in your body (iron deficiency anemia). Iron is a mineral that plays an important role in making red blood cells, which carry oxygen from your lungs to the rest of your body. This medicine may be used for other purposes; ask your health care provider or pharmacist if you have questions. COMMON BRAND NAME(S): MONOFERRIC  What should I tell my care team before I take this medication? They need to know if you have any of these conditions: High levels of iron in the blood An unusual or allergic reaction to iron, other  medications, foods, dyes, or preservatives Pregnant or trying to get pregnant Breastfeeding How should I use this medication? This medication is injected into a vein. It is given by your care team in a hospital or clinic setting. Talk to your care team about the use of this medication in children. Special care may be needed. Overdosage: If you think you have taken too much of this medicine contact a poison control center or emergency room at once. NOTE: This medicine is only for you. Do not share this medicine with others. What if I miss a dose? It is important not to miss your dose. Call your care team if you are unable to keep an appointment. What may interact with this  medication? Do not take this medication with any of the following: Deferoxamine Dimercaprol  Other iron products This list may not describe all possible interactions. Give your health care provider a list of all the medicines, herbs, non-prescription drugs, or dietary supplements you use. Also tell them if you smoke, drink alcohol, or use illegal drugs. Some items may interact with your medicine. What should I watch for while using this medication? Visit your care team for regular checks on your progress. Tell your care team if your symptoms do not start to get better or if they get worse. You may need blood work done while you are taking this medication. You may need to eat more foods that contain iron. Talk to your care team. Foods that contain iron include whole grains or cereals, dried fruits, beans, peas, leafy green vegetables, and organ meats (liver, kidney). What side effects may I notice from receiving this medication? Side effects that you should report to your care team as soon as possible: Allergic reactions--skin rash, itching, hives, swelling of the face, lips, tongue, or throat Low blood pressure--dizziness, feeling faint or lightheaded, blurry vision Shortness of breath Side effects that usually do not require medical attention (report to your care team if they continue or are bothersome): Flushing Headache Joint pain Muscle pain Nausea Pain, redness, or irritation at injection site This list may not describe all possible side effects. Call your doctor for medical advice about side effects. You may report side effects to FDA at 1-800-FDA-1088. Where should I keep my medication? This medication is given in a hospital or clinic. It will not be stored at home. NOTE: This sheet is a summary. It may not cover all possible information. If you have questions about this medicine, talk to your doctor, pharmacist, or health care provider.  2024 Elsevier/Gold Standard (2023-07-14  00:00:00)

## 2024-07-14 ENCOUNTER — Telehealth: Payer: Self-pay

## 2024-07-14 NOTE — Telephone Encounter (Signed)
 Pt is requesting refills on promethazine 

## 2024-07-17 MED ORDER — PROMETHAZINE HCL 12.5 MG PO TABS: ORAL_TABLET | ORAL | 1 refills | Status: DC

## 2024-07-17 NOTE — Telephone Encounter (Signed)
 Completed.

## 2024-07-17 NOTE — Addendum Note (Signed)
 Addended by: SHIRLEAN THERISA ORN on: 07/17/2024 12:58 PM   Modules accepted: Orders

## 2024-09-06 ENCOUNTER — Inpatient Hospital Stay: Attending: Oncology

## 2024-09-06 DIAGNOSIS — D709 Neutropenia, unspecified: Secondary | ICD-10-CM | POA: Insufficient documentation

## 2024-09-06 DIAGNOSIS — D649 Anemia, unspecified: Secondary | ICD-10-CM | POA: Insufficient documentation

## 2024-09-06 DIAGNOSIS — E610 Copper deficiency: Secondary | ICD-10-CM | POA: Insufficient documentation

## 2024-09-06 DIAGNOSIS — K5900 Constipation, unspecified: Secondary | ICD-10-CM | POA: Insufficient documentation

## 2024-09-06 LAB — CBC WITH DIFFERENTIAL/PLATELET
Abs Immature Granulocytes: 0.01 K/uL (ref 0.00–0.07)
Basophils Absolute: 0 K/uL (ref 0.0–0.1)
Basophils Relative: 0 %
Eosinophils Absolute: 0.2 K/uL (ref 0.0–0.5)
Eosinophils Relative: 3 %
HCT: 41.8 % (ref 36.0–46.0)
Hemoglobin: 13.3 g/dL (ref 12.0–15.0)
Immature Granulocytes: 0 %
Lymphocytes Relative: 20 %
Lymphs Abs: 1.3 K/uL (ref 0.7–4.0)
MCH: 29.2 pg (ref 26.0–34.0)
MCHC: 31.8 g/dL (ref 30.0–36.0)
MCV: 91.7 fL (ref 80.0–100.0)
Monocytes Absolute: 0.6 K/uL (ref 0.1–1.0)
Monocytes Relative: 9 %
Neutro Abs: 4.3 K/uL (ref 1.7–7.7)
Neutrophils Relative %: 68 %
Platelets: 324 K/uL (ref 150–400)
RBC: 4.56 MIL/uL (ref 3.87–5.11)
RDW: 14.8 % (ref 11.5–15.5)
WBC: 6.4 K/uL (ref 4.0–10.5)
nRBC: 0 % (ref 0.0–0.2)

## 2024-09-06 LAB — FOLATE: Folate: >20 ng/mL (ref >5.9)

## 2024-09-06 LAB — COMPREHENSIVE METABOLIC PANEL WITH GFR
ALT: 21 U/L (ref 0–44)
AST: 39 U/L (ref 15–41)
Albumin: 4.3 g/dL (ref 3.5–5.0)
Alkaline Phosphatase: 155 U/L — ABNORMAL HIGH (ref 38–126)
Anion gap: 13 (ref 5–15)
BUN: 11 mg/dL (ref 8–23)
CO2: 26 mmol/L (ref 22–32)
Calcium: 9.6 mg/dL (ref 8.9–10.3)
Chloride: 101 mmol/L (ref 98–111)
Creatinine, Ser: 0.75 mg/dL (ref 0.44–1.00)
GFR, Estimated: >60 mL/min (ref >60)
Glucose, Bld: 95 mg/dL (ref 70–99)
Potassium: 3.4 mmol/L — ABNORMAL LOW (ref 3.5–5.1)
Sodium: 140 mmol/L (ref 135–145)
Total Bilirubin: 0.4 mg/dL (ref 0.0–1.2)
Total Protein: 7.4 g/dL (ref 6.5–8.1)

## 2024-09-06 LAB — VITAMIN B12: Vitamin B-12: 619 pg/mL (ref 180–914)

## 2024-09-06 LAB — IRON AND TIBC
Iron: 41 ug/dL (ref 28–170)
Saturation Ratios: 15 % (ref 10.4–31.8)
TIBC: 273 ug/dL (ref 250–450)
UIBC: 232 ug/dL

## 2024-09-06 LAB — FERRITIN: Ferritin: 316 ng/mL — ABNORMAL HIGH (ref 11–307)

## 2024-09-08 LAB — COPPER, SERUM: Copper: 101 ug/dL (ref 80–158)

## 2024-09-08 LAB — ZINC: Zinc: 62 ug/dL (ref 44–115)

## 2024-09-13 ENCOUNTER — Inpatient Hospital Stay: Admitting: Oncology

## 2024-09-13 VITALS — BP 108/73 | HR 70 | Temp 98.1°F | Resp 20 | Wt 147.0 lb

## 2024-09-13 DIAGNOSIS — E610 Copper deficiency: Secondary | ICD-10-CM

## 2024-09-13 DIAGNOSIS — D708 Other neutropenia: Secondary | ICD-10-CM

## 2024-09-13 DIAGNOSIS — D649 Anemia, unspecified: Secondary | ICD-10-CM

## 2024-09-13 NOTE — Assessment & Plan Note (Addendum)
 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 Repeat copper  levels 101. Stop copper  supplements.

## 2024-09-13 NOTE — Progress Notes (Signed)
 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 Repeat copper  levels 101. Stop copper  supplements. Anemia, unspecified type Likely secondary to copper  and iron deficiency Patient is due for colonoscopy and has postponed it. Recent endoscopy with no evidence of bleeding Patient is taking daily ferrous sulfate every other day with MiraLAX for constipation. She was also started on copper  supplements. She received 1 dose of IV iron on 07/13/2024 with good tolerance. She last met with GI on 04/25/2024 and she was started on Movantik  and instructed that she needed to have a colonoscopy/EGD with dilatation with Dr. Cindie in the near future but she postponed this. We reviewed labs from 09/06/2024 which showed improvement of her hemoglobin and ferritin although iron saturation still remain low at 15%. Would recommend 1 additional dose of IV iron. Continue iron supplements but please stop folic acid  and copper  supplements.  Will recheck these in 3 months.   Orders Placed This Encounter  Procedures   CBC with Differential/Platelet    Standing Status:   Future    Expected Date:   12/14/2024    Expiration Date:   03/14/2025   Comprehensive metabolic panel with GFR    Standing Status:   Future    Expected Date:   12/14/2024    Expiration Date:   03/14/2025   Ferritin    Standing Status:   Future    Expected Date:   12/14/2024    Expiration Date:   03/14/2025   Vitamin B12    Standing Status:   Future    Expected Date:   12/14/2024    Expiration Date:   03/14/2025   Iron and TIBC    Standing Status:   Future    Expected Date:   12/14/2024    Expiration Date:   03/14/2025   Copper ,  serum    Standing Status:   Future    Expected Date:   12/14/2024    Expiration Date:   03/14/2025   Zinc    Standing Status:   Future    Expected Date:   12/14/2024    Expiration Date:   03/14/2025   Folate    Standing Status:   Future    Expected Date:   12/14/2024    Expiration Date:   03/14/2025    INTERVAL HISTORY: Patient returns for follow-up.  She received 1 dose Monoferric  on 07/13/2024 with great tolerance.  She is here to review most recent lab results.  Recent hospitalizations surgeries or changes in her baseline health.  Reports overall feeling somewhat better since receiving her iron infusion.  Reports she still has fatigue.  She is still unable to walk due to instability on her feet.  Reports the pads of her feet feel swollen.  Denies any swelling in her ankles or legs.  States prior to her becoming anemic, she was able to get around on her own.  Reports her appetite is also low.  Has pain in her left hip.  She has had 2 falls in the past 2 months.  Reports numbness and burning in her fingers.  They occasionally will feel very cold.  She has trouble swallowing solid foods but this is a chronic problem for her.  She has diarrhea  and nausea and is followed by gastroenterology.  Since her last visit, she denies any hospitalizations, surgeries or changes to her baseline health.  She has been taking copper , folic acid  and iron supplements as prescribed.  We reviewed CBC, CMP, iron panel, ferritin, B12, copper  and folate.  SUMMARY OF HEMATOLOGIC HISTORY: Oncology History   No history exists.     CBC    Component Value Date/Time   WBC 6.4 09/06/2024 1055   RBC 4.56 09/06/2024 1055   HGB 13.3 09/06/2024 1055   HCT 41.8 09/06/2024 1055   PLT 324 09/06/2024 1055   MCV 91.7 09/06/2024 1055   MCH 29.2 09/06/2024 1055   MCHC 31.8 09/06/2024 1055   RDW 14.8 09/06/2024 1055   LYMPHSABS 1.3 09/06/2024 1055   MONOABS 0.6 09/06/2024 1055   EOSABS 0.2 09/06/2024 1055   BASOSABS 0.0  09/06/2024 1055       Latest Ref Rng & Units 09/06/2024   10:55 AM 06/23/2024    9:53 AM 09/08/2019    4:10 PM  CMP  Glucose 70 - 99 mg/dL 95  94  86   BUN 8 - 23 mg/dL 11  11  10    Creatinine 0.44 - 1.00 mg/dL 9.24  9.42  9.25   Sodium 135 - 145 mmol/L 140  139  139   Potassium 3.5 - 5.1 mmol/L 3.4  3.6  3.9   Chloride 98 - 111 mmol/L 101  102  110   CO2 22 - 32 mmol/L 26  25  23    Calcium 8.9 - 10.3 mg/dL 9.6  8.8  8.6   Total Protein 6.5 - 8.1 g/dL 7.4  6.9  6.6   Total Bilirubin 0.0 - 1.2 mg/dL 0.4  0.6  0.7   Alkaline Phos 38 - 126 U/L 155  46  43   AST 15 - 41 U/L 39  16  17   ALT 0 - 44 U/L 21  11  9       Lab Results  Component Value Date   FERRITIN 316 (H) 09/06/2024   VITAMINB12 619 09/06/2024    Vitals:   09/13/24 1303  BP: 108/73  Pulse: 70  Resp: 20  Temp: 98.1 F (36.7 C)  SpO2: 100%    Review of System:  Review of Systems  Constitutional:  Positive for malaise/fatigue and weight loss.  Gastrointestinal:  Positive for diarrhea, nausea and vomiting.  Neurological:  Positive for dizziness and sensory change.  Psychiatric/Behavioral:  Positive for depression. The patient is nervous/anxious.     Physical Exam: Physical Exam Constitutional:      Appearance: Normal appearance. She is obese.     Comments: Patient in no acute distress sitting in wheelchair.  HENT:     Head: Normocephalic and atraumatic.  Eyes:     Pupils: Pupils are equal, round, and reactive to light.  Cardiovascular:     Rate and Rhythm: Normal rate and regular rhythm.     Heart sounds: Normal heart sounds. No murmur heard. Pulmonary:     Effort: Pulmonary effort is normal.     Breath sounds: Normal breath sounds. No wheezing.  Abdominal:     General: Bowel sounds are normal. There is no distension.     Palpations: Abdomen is soft.     Tenderness: There is no abdominal tenderness.  Musculoskeletal:        General: Normal range of motion.     Cervical back: Normal range of  motion.  Skin:  General: Skin is warm and dry.     Findings: No rash.  Neurological:     Mental Status: She is alert and oriented to person, place, and time.     Gait: Gait is intact.  Psychiatric:        Mood and Affect: Mood and affect normal.        Cognition and Memory: Memory normal.        Judgment: Judgment normal.      I spent 25 minutes dedicated to the care of this patient (face-to-face and non-face-to-face) on the date of the encounter to include what is described in the assessment and plan.,  Delon Hope, NP 09/13/2024 1:37 PM

## 2024-09-13 NOTE — Patient Instructions (Addendum)
 [facility] at [facility] **VISIT SUMMARY & IMPORTANT INSTRUCTIONS **    You were seen today by [facility] for Iron deficiency.    PLAN SUMMARY:   Stop Copper  supplements. Continue iron and folic acid .  Recommend 1 dose of IV iron.  RTC in 3 months with labs and see NP.          1. Other neutropenia (Primary) Improved  2. Copper  deficiency Stop taking   3. Anemia, unspecified type Continue iron. Stop folic acid .      LABS: Return in 3 months with labs and see NP...   OTHER TESTS: None   MEDICATIONS: None   FOLLOW-UP APPOINTMENT: 3 months    ** Thank you for trusting me with your healthcare!  I strive to provide all of my patients with quality care at each visit.  If you receive a survey for this visit, I would be so grateful to you for taking the time to provide feedback.  Thank you in advance!                                          Dr. Mickiel Dry        Pleasant Barefoot, PA-C          Ellen Hope, NP    - - - - - - - - - - - - - - - - - -      Thank you for choosing [facility] to provide your oncology and hematology care.  To afford each patient quality time with our provider, please arrive at least 15 minutes before your scheduled appointment time.    If you have a lab appointment with the Cancer Center please come in thru the Main Entrance and check in at the main information desk.   You need to re-schedule your appointment should you arrive 10 or more minutes late.  We strive to give you quality time with our providers, and arriving late affects you and other patients whose appointments are after yours.  Also, if you no show three or more times for appointments you may be dismissed from the clinic at the providers discretion.     Again, thank you for choosing [facility].  Our Anderson is that these requests will decrease the amount of time that you wait before being seen by our  physicians.       _____________________________________________________________   Should you have questions after your visit to [facility], please contact our office at [phone] and follow the prompts.  Our office hours are 8:00 a.m. and 4:30 p.m. Monday - Friday.  Please note that voicemails left after 4:00 p.m. may not be returned until the following business day.  We are closed weekends and major holidays.  You do have access to a nurse 24-7, just call the main number to the clinic [phone] and do not press any options, hold on the line and a nurse will answer the phone.     For prescription refill requests, have your pharmacy contact our office and allow 72 hours.

## 2024-09-13 NOTE — Assessment & Plan Note (Addendum)
 Likely secondary to copper  and iron deficiency Patient is due for colonoscopy and has postponed it. Recent endoscopy with no evidence of bleeding Patient is taking daily ferrous sulfate every other day with MiraLAX for constipation. She was also started on copper  supplements. She received 1 dose of IV iron on 07/13/2024 with good tolerance. She last met with GI on 04/25/2024 and she was started on Movantik  and instructed that she needed to have a colonoscopy/EGD with dilatation with Dr. Cindie in the near future but she postponed this. We reviewed labs from 09/06/2024 which showed improvement of her hemoglobin and ferritin although iron saturation still remain low at 15%. Would recommend 1 additional dose of IV iron. Continue iron supplements but please stop folic acid  and copper  supplements.  Will recheck these in 3 months.

## 2024-09-13 NOTE — Assessment & Plan Note (Addendum)
 Likely secondary to copper  deficiency Flow cytometry: Negative Neutropenia has resolved

## 2024-09-15 ENCOUNTER — Inpatient Hospital Stay

## 2024-09-15 VITALS — BP 108/71 | HR 73 | Temp 97.4°F | Resp 18

## 2024-09-15 DIAGNOSIS — D500 Iron deficiency anemia secondary to blood loss (chronic): Secondary | ICD-10-CM

## 2024-09-15 DIAGNOSIS — D649 Anemia, unspecified: Secondary | ICD-10-CM | POA: Diagnosis not present

## 2024-09-15 MED ORDER — SODIUM CHLORIDE 0.9 % IV SOLN: INTRAVENOUS | Status: DC

## 2024-09-15 MED ORDER — ACETAMINOPHEN 325 MG PO TABS: 650.0000 mg | ORAL_TABLET | Freq: Once | ORAL | Status: DC

## 2024-09-15 MED ORDER — SODIUM CHLORIDE 0.9 % IV SOLN
1000.0000 mg | Freq: Once | INTRAVENOUS | Status: AC
  Administered 2024-09-15: 1000 mg via INTRAVENOUS
  Filled 2024-09-15: qty 1000

## 2024-09-15 MED ORDER — CETIRIZINE HCL 10 MG/ML IV SOLN
5.0000 mg | Freq: Once | INTRAVENOUS | Status: AC
  Administered 2024-09-15: 5 mg via INTRAVENOUS
  Filled 2024-09-15: qty 1

## 2024-09-15 NOTE — Progress Notes (Signed)
 Patient tolerated iron  infusion with no complaints voiced.  Peripheral IV site clean and dry with good blood return noted before and after infusion.  Pt observed for 30 minutes post iron  infusion without any complications. VSS with discharge and left in satisfactory condition with no s/s of distress noted. All follow ups as scheduled.  [facility]

## 2024-09-15 NOTE — Patient Instructions (Signed)

## 2024-09-22 ENCOUNTER — Other Ambulatory Visit (HOSPITAL_COMMUNITY): Payer: Self-pay | Admitting: Adult Medicine

## 2024-09-22 DIAGNOSIS — M810 Age-related osteoporosis without current pathological fracture: Secondary | ICD-10-CM

## 2024-09-25 ENCOUNTER — Other Ambulatory Visit: Payer: Self-pay

## 2024-09-25 DIAGNOSIS — T402X5A Adverse effect of other opioids, initial encounter: Secondary | ICD-10-CM

## 2024-09-25 DIAGNOSIS — R110 Nausea: Secondary | ICD-10-CM

## 2024-09-25 DIAGNOSIS — R1310 Dysphagia, unspecified: Secondary | ICD-10-CM

## 2024-09-25 MED ORDER — PROMETHAZINE HCL 12.5 MG PO TABS: ORAL_TABLET | ORAL | 0 refills | Status: DC

## 2024-10-30 ENCOUNTER — Telehealth: Payer: Self-pay

## 2024-10-30 ENCOUNTER — Other Ambulatory Visit: Payer: Self-pay | Admitting: Gastroenterology

## 2024-10-30 NOTE — Telephone Encounter (Signed)
 Returned the pt's call left on vm . No ans. LMOVM fpr the pt to return call

## 2024-10-31 NOTE — Telephone Encounter (Signed)
 Pt returned call and LMOVM for me to return call. Phoned the pt back and was advised that she went to get her Movantik  and she stated the pharmacy said it was not being filled by the dr. I looked in the pt;s chart and I advised her that it was phoned in by Therisa Stager NP . If she has any problems to call me back. Pt expressed understanding

## 2024-12-06 ENCOUNTER — Inpatient Hospital Stay: Attending: Oncology

## 2024-12-06 DIAGNOSIS — D649 Anemia, unspecified: Secondary | ICD-10-CM | POA: Insufficient documentation

## 2024-12-06 DIAGNOSIS — D708 Other neutropenia: Secondary | ICD-10-CM

## 2024-12-06 DIAGNOSIS — D709 Neutropenia, unspecified: Secondary | ICD-10-CM | POA: Diagnosis present

## 2024-12-06 DIAGNOSIS — E610 Copper deficiency: Secondary | ICD-10-CM

## 2024-12-06 LAB — CBC WITH DIFFERENTIAL/PLATELET
Abs Immature Granulocytes: 0.01 K/uL (ref 0.00–0.07)
Basophils Absolute: 0 K/uL (ref 0.0–0.1)
Basophils Relative: 0 %
Eosinophils Absolute: 0.2 K/uL (ref 0.0–0.5)
Eosinophils Relative: 3 %
HCT: 42.5 % (ref 36.0–46.0)
Hemoglobin: 13.5 g/dL (ref 12.0–15.0)
Immature Granulocytes: 0 %
Lymphocytes Relative: 19 %
Lymphs Abs: 1.2 K/uL (ref 0.7–4.0)
MCH: 30.8 pg (ref 26.0–34.0)
MCHC: 31.8 g/dL (ref 30.0–36.0)
MCV: 97 fL (ref 80.0–100.0)
Monocytes Absolute: 0.4 K/uL (ref 0.1–1.0)
Monocytes Relative: 7 %
Neutro Abs: 4.3 K/uL (ref 1.7–7.7)
Neutrophils Relative %: 71 %
Platelets: 278 K/uL (ref 150–400)
RBC: 4.38 MIL/uL (ref 3.87–5.11)
RDW: 15.5 % (ref 11.5–15.5)
WBC: 6.1 K/uL (ref 4.0–10.5)
nRBC: 0 % (ref 0.0–0.2)

## 2024-12-06 LAB — COMPREHENSIVE METABOLIC PANEL WITH GFR
ALT: 12 U/L (ref 0–44)
AST: 24 U/L (ref 15–41)
Albumin: 4.2 g/dL (ref 3.5–5.0)
Alkaline Phosphatase: 107 U/L (ref 38–126)
Anion gap: 13 (ref 5–15)
BUN: 8 mg/dL (ref 8–23)
CO2: 22 mmol/L (ref 22–32)
Calcium: 8.9 mg/dL (ref 8.9–10.3)
Chloride: 105 mmol/L (ref 98–111)
Creatinine, Ser: 0.54 mg/dL (ref 0.44–1.00)
GFR, Estimated: >60 mL/min
Glucose, Bld: 100 mg/dL — ABNORMAL HIGH (ref 70–99)
Potassium: 4.4 mmol/L (ref 3.5–5.1)
Sodium: 140 mmol/L (ref 135–145)
Total Bilirubin: 0.5 mg/dL (ref 0.0–1.2)
Total Protein: 7.2 g/dL (ref 6.5–8.1)

## 2024-12-06 LAB — IRON AND TIBC
Iron: 85 ug/dL (ref 28–170)
Saturation Ratios: 31 % (ref 10.4–31.8)
TIBC: 274 ug/dL (ref 250–450)
UIBC: 189 ug/dL

## 2024-12-06 LAB — VITAMIN B12: Vitamin B-12: 555 pg/mL (ref 180–914)

## 2024-12-06 LAB — FOLATE: Folate: >20 ng/mL

## 2024-12-06 LAB — FERRITIN: Ferritin: 573 ng/mL — ABNORMAL HIGH (ref 11–307)

## 2024-12-09 LAB — COPPER, SERUM: Copper: 139 ug/dL (ref 80–158)

## 2024-12-09 LAB — ZINC: Zinc: 129 ug/dL — ABNORMAL HIGH (ref 44–115)

## 2024-12-14 ENCOUNTER — Inpatient Hospital Stay: Attending: Oncology | Admitting: Oncology

## 2024-12-14 VITALS — BP 103/75 | HR 84 | Temp 98.2°F | Resp 18 | Ht 62.0 in | Wt 149.0 lb

## 2024-12-14 DIAGNOSIS — D508 Other iron deficiency anemias: Secondary | ICD-10-CM | POA: Insufficient documentation

## 2024-12-14 DIAGNOSIS — D708 Other neutropenia: Secondary | ICD-10-CM | POA: Diagnosis not present

## 2024-12-14 DIAGNOSIS — D709 Neutropenia, unspecified: Secondary | ICD-10-CM | POA: Diagnosis present

## 2024-12-14 DIAGNOSIS — E610 Copper deficiency: Secondary | ICD-10-CM | POA: Diagnosis not present

## 2024-12-14 DIAGNOSIS — K5900 Constipation, unspecified: Secondary | ICD-10-CM | POA: Diagnosis not present

## 2024-12-14 NOTE — Progress Notes (Signed)
 "  [facility] OFFICE PROGRESS NOTE  [name], MD (Inactive)  ASSESSMENT & PLAN:    Assessment & Plan Other iron deficiency anemia Likely secondary to copper  and iron deficiency Patient is due for colonoscopy and has postponed it. Recent endoscopy with no evidence of bleeding. Patient is taking daily ferrous sulfate every other day with MiraLAX for constipation. She was also started on copper  supplements. She received 1 dose of IV iron on 07/13/2024 and a second dose on 09/15/2024. She last met with GI on 04/25/2024 and she was started on Movantik  and instructed that she needed to have a colonoscopy/EGD with dilatation with Dr. Cindie in the near future but she postponed this. We reviewed labs from 12/06/2024 which showed iron saturation 31%, ferritin 573 and hemoglobin 13.5. No additional IV iron needed at this time. Continue iron supplements but please stop folic acid  and copper  supplements.  Will recheck these in 3 months.  Other neutropenia Likely secondary to copper  deficiency Flow cytometry: Negative Neutropenia has resolved Copper  deficiency 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 Repeat copper  levels are 139. She is no longer taking copper  supplements.  Orders Placed This Encounter  Procedures   Folate    Standing Status:   Future    Expected Date:   04/13/2025    Expiration Date:   07/12/2025   Zinc     Standing Status:   Future    Expected Date:   04/13/2025    Expiration Date:   07/12/2025   Copper , serum    Standing Status:   Future    Expected Date:   04/13/2025    Expiration Date:   07/12/2025   Iron and TIBC    Standing Status:   Future    Expected Date:   04/13/2025    Expiration Date:   07/12/2025   Vitamin B12    Standing Status:   Future    Expected Date:   04/13/2025    Expiration Date:   07/12/2025   Ferritin    Standing Status:   Future    Expected Date:    04/13/2025    Expiration Date:   07/12/2025   Comprehensive metabolic panel with GFR    Standing Status:   Future    Expected Date:   04/13/2025    Expiration Date:   07/12/2025   CBC with Differential/Platelet    Standing Status:   Future    Expected Date:   04/13/2025    Expiration Date:   07/12/2025    INTERVAL HISTORY: Patient returns for follow-up.  She received 1 dose Monoferric  on 07/13/2024 and again on 09/15/2024.  She denies any recent hospitalizations surgeries or changes in her baseline health.  Reports an appetite of 100% energy levels are low.  She has 6 out of 10 left hip pain.  Has chronic dysphagia, constipation, nausea, head aches and trouble falling and staying asleep.  She has numbness and tingling at the bottom of her feet and fingers and was recently started on gabapentin.  Reports if she can get the numbness figured out, she would be able to walk.  She is currently only taking iron supplements as she was told to stop taking folic acid  and copper .  We reviewed CBC, CMP, iron panel, ferritin, B12, copper  and folate.  SUMMARY OF HEMATOLOGIC HISTORY: Oncology History   No problem history exists.     CBC    Component Value Date/Time  WBC 6.1 12/06/2024 1336   RBC 4.38 12/06/2024 1336   HGB 13.5 12/06/2024 1336   HCT 42.5 12/06/2024 1336   PLT 278 12/06/2024 1336   MCV 97.0 12/06/2024 1336   MCH 30.8 12/06/2024 1336   MCHC 31.8 12/06/2024 1336   RDW 15.5 12/06/2024 1336   LYMPHSABS 1.2 12/06/2024 1336   MONOABS 0.4 12/06/2024 1336   EOSABS 0.2 12/06/2024 1336   BASOSABS 0.0 12/06/2024 1336       Latest Ref Rng & Units 12/06/2024    1:36 PM 09/06/2024   10:55 AM 06/23/2024    9:53 AM  CMP  Glucose 70 - 99 mg/dL 899  95  94   BUN 8 - 23 mg/dL 8  11  11    Creatinine 0.44 - 1.00 mg/dL 9.45  9.24  9.42   Sodium 135 - 145 mmol/L 140  140  139   Potassium 3.5 - 5.1 mmol/L 4.4  3.4  3.6   Chloride 98 - 111 mmol/L 105  101  102   CO2 22 - 32 mmol/L 22  26  25     Calcium 8.9 - 10.3 mg/dL 8.9  9.6  8.8   Total Protein 6.5 - 8.1 g/dL 7.2  7.4  6.9   Total Bilirubin 0.0 - 1.2 mg/dL 0.5  0.4  0.6   Alkaline Phos 38 - 126 U/L 107  155  46   AST 15 - 41 U/L 24  39  16   ALT 0 - 44 U/L 12  21  11       Lab Results  Component Value Date   FERRITIN 573 (H) 12/06/2024   VITAMINB12 555 12/06/2024    Vitals:   12/14/24 1322  BP: 103/75  Pulse: 84  Resp: 18  Temp: 98.2 F (36.8 C)  SpO2: 99%    Review of System:  Review of Systems  Constitutional:  Positive for malaise/fatigue.  Gastrointestinal:  Positive for constipation and nausea.  Musculoskeletal:  Positive for joint pain.  Neurological:  Positive for headaches.  Psychiatric/Behavioral:  The patient has insomnia.     Physical Exam: Physical Exam Constitutional:      Appearance: Normal appearance.  HENT:     Head: Normocephalic and atraumatic.  Eyes:     Pupils: Pupils are equal, round, and reactive to light.  Cardiovascular:     Rate and Rhythm: Normal rate and regular rhythm.     Heart sounds: Normal heart sounds. No murmur heard. Pulmonary:     Effort: Pulmonary effort is normal.     Breath sounds: Normal breath sounds. No wheezing.  Abdominal:     General: Bowel sounds are normal. There is no distension.     Palpations: Abdomen is soft.     Tenderness: There is no abdominal tenderness.  Musculoskeletal:        General: Normal range of motion.     Cervical back: Normal range of motion.  Skin:    General: Skin is warm and dry.     Findings: No rash.  Neurological:     Mental Status: She is alert and oriented to person, place, and time.     Gait: Gait is intact.  Psychiatric:        Mood and Affect: Mood and affect normal.        Cognition and Memory: Memory normal.        Judgment: Judgment normal.      I spent 25 minutes dedicated to the care of this  patient (face-to-face and non-face-to-face) on the date of the encounter to include what is described in the  assessment and plan.,  Delon Hope, NP 12/14/2024 1:58 PM "

## 2024-12-14 NOTE — Assessment & Plan Note (Addendum)
 Likely secondary to copper  and iron deficiency Patient is due for colonoscopy and has postponed it. Recent endoscopy with no evidence of bleeding. Patient is taking daily ferrous sulfate every other day with MiraLAX for constipation. She was also started on copper  supplements. She received 1 dose of IV iron on 07/13/2024 and a second dose on 09/15/2024. She last met with GI on 04/25/2024 and she was started on Movantik  and instructed that she needed to have a colonoscopy/EGD with dilatation with Dr. Cindie in the near future but she postponed this. We reviewed labs from 12/06/2024 which showed iron saturation 31%, ferritin 573 and hemoglobin 13.5. No additional IV iron needed at this time. Continue iron supplements but please stop folic acid  and copper  supplements.  Will recheck these in 3 months.

## 2024-12-14 NOTE — Assessment & Plan Note (Addendum)
 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 Repeat copper  levels are 139. She is no longer taking copper  supplements.

## 2024-12-14 NOTE — Assessment & Plan Note (Addendum)
 Likely secondary to copper  deficiency Flow cytometry: Negative Neutropenia has resolved

## 2024-12-21 ENCOUNTER — Other Ambulatory Visit: Payer: Self-pay | Admitting: Gastroenterology

## 2024-12-21 ENCOUNTER — Ambulatory Visit: Attending: Cardiology | Admitting: Cardiology

## 2024-12-21 ENCOUNTER — Encounter: Payer: Self-pay | Admitting: Cardiology

## 2024-12-21 VITALS — BP 110/70 | HR 86 | Ht 62.0 in | Wt 151.0 lb

## 2024-12-21 DIAGNOSIS — K5903 Drug induced constipation: Secondary | ICD-10-CM

## 2024-12-21 DIAGNOSIS — R1310 Dysphagia, unspecified: Secondary | ICD-10-CM

## 2024-12-21 DIAGNOSIS — R9431 Abnormal electrocardiogram [ECG] [EKG]: Secondary | ICD-10-CM | POA: Diagnosis not present

## 2024-12-21 DIAGNOSIS — R110 Nausea: Secondary | ICD-10-CM

## 2024-12-21 NOTE — Progress Notes (Signed)
 "     Clinical Summary Ellen Anderson is a 79 y.o.female seen today as a new consult, referred by NP Kalombo for the following medical problems.  1.Abnormal EKG - EKG sent from pcp. Some baseline artifact. Computer read questions if old anterior infarct, I don't see evidence of that. - EKG today shows NSR, no signs of acute ischemia or prior infarct.  - no chest pains, no SOB  Past Medical History:  Diagnosis Date   Anemia    Chronic low back pain    Degenerative joint disease    Hip fracture, left (HCC) 03/12/2012   Hypertension    PONV (postoperative nausea and vomiting)      Allergies[1]   Current Outpatient Medications  Medication Sig Dispense Refill   denosumab (PROLIA) 60 MG/ML SOLN injection Inject 60 mg into the skin every 6 (six) months. Administer in upper arm, thigh, or abdomen     diphenhydrAMINE  (BENADRYL ) 25 mg capsule Take 75 mg by mouth every 6 (six) hours as needed for itching. Takes 3 before and 3 after taking taking Oxycodone       FEROSUL 325 (65 Fe) MG tablet Take 325 mg by mouth daily.     folic acid  (FOLVITE ) 1 MG tablet Take 1 tablet every day by oral route.     furosemide (LASIX) 20 MG tablet TAKE 1 TABLET BY MOUTH ONCE A DAY WITH A MEAL FOR SWELLING     gabapentin (NEURONTIN) 100 MG capsule Take 1 capsule 3 times a day by oral route for 30 days.     LINZESS  290 MCG CAPS capsule Take 290 mcg by mouth daily.     MOVANTIK  12.5 MG TABS tablet TAKE 1 TABLET(12.5 MG) BY MOUTH DAILY 1 HOUR BEFORE OR 2 HOURS AFTER A MEAL 30 tablet 5   Multiple Vitamin (MULTIVITAMIN) tablet Take 1 tablet by mouth daily. Contains copper  and no zinc      omeprazole  (PRILOSEC) 20 MG capsule TAKE 1 CAPSULE(20 MG) BY MOUTH TWICE DAILY BEFORE A MEAL 180 capsule 3   oxybutynin (DITROPAN) 5 MG tablet TAKE 1 TABLET BY MOUTH TWICE DAILY FOR BLADDER     oxyCODONE  (OXY IR/ROXICODONE ) 5 MG immediate release tablet Take 5 mg by mouth 3 (three) times daily as needed.     pramipexole (MIRAPEX) 0.25  MG tablet Take 0.25 mg by mouth 3 (three) times daily.     promethazine  (PHENERGAN ) 12.5 MG tablet TAKE 1 TABLET BY MOUTH EVERY 6 TO 8 HOURS AS NEEDED FOR NAUSEA OR VOMITING 30 tablet 0   Propylene Glycol 0.6 % SOLN Place 1 drop into both eyes daily as needed (dry eyes).     Sodium Sulfate-Mag Sulfate-KCl (SUTAB ) [phone] MG TABS Take 12 tablets by mouth as directed. 24 tablet 0   Vitamin D, Ergocalciferol, (DRISDOL) 1.25 MG (50000 UNIT) CAPS capsule Take by mouth.     No current facility-administered medications for this visit.     Past Surgical History:  Procedure Laterality Date   ABDOMINAL HYSTERECTOMY     ANKLE SURGERY Left    pins and screws   APPENDECTOMY     BACK SURGERY     BALLOON DILATION N/A 12/30/2021   Procedure: BALLOON DILATION;  Surgeon: Cindie Carlin POUR, DO;  [facility]: [facility];  Service: Endoscopy;  Laterality: N/A;   BIOPSY  05/20/2020   Procedure: BIOPSY;  Surgeon: Shaaron Lamar HERO, MD;  Location: [facility];  Service: Endoscopy;;   CATARACT EXTRACTION W/PHACO Right 02/18/2015   Procedure: CATARACT  EXTRACTION PHACO AND INTRAOCULAR LENS PLACEMENT RIGHT EYE; CDE:  10.48;  Surgeon: Cherene Mania, MD;  Location: [facility];  Service: Ophthalmology;  Laterality: Right;   CATARACT EXTRACTION W/PHACO Left 03/07/2015   Procedure: CATARACT EXTRACTION PHACO AND INTRAOCULAR LENS PLACEMENT (IOC);  Surgeon: Cherene Mania, MD;  Location: [facility];  Service: Ophthalmology;  Laterality: Left;  CDE:7.14   CHOLECYSTECTOMY     COLONOSCOPY N/A 03/04/2016   three polyps, hemorrhoids. Tubular adenoma. Colonoscopy 2022.   ESOPHAGEAL DILATION N/A 03/14/2024   Procedure: DILATION, ESOPHAGUS;  Surgeon: Cindie Carlin POUR, DO;  Location: [facility];  Service: Endoscopy;  Laterality: N/A;   ESOPHAGOGASTRODUODENOSCOPY  07/23/2004   Dr. 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 Harvey: food bolus which  would not pass through gastric remnant into jejunostomy, PORK CHOP removed. AVOID PORK CHOPS   ESOPHAGOGASTRODUODENOSCOPY N/A 03/04/2016   Procedure: ESOPHAGOGASTRODUODENOSCOPY (EGD);  Surgeon: Margo LITTIE Harvey, MD;  [facility]: [facility];  Service: Endoscopy;  Laterality: N/A;   ESOPHAGOGASTRODUODENOSCOPY N/A 03/14/2024   Procedure: EGD (ESOPHAGOGASTRODUODENOSCOPY);  Surgeon: Cindie Carlin POUR, DO;  [facility]: [facility];  Service: Endoscopy;  Laterality: N/A;  11:00 am, asa 2   ESOPHAGOGASTRODUODENOSCOPY (EGD) WITH PROPOFOL  N/A 05/23/2019   Procedure: ESOPHAGOGASTRODUODENOSCOPY (EGD) WITH PROPOFOL ;  Surgeon: Harvey Margo LITTIE, MD;  [facility]: [facility];  Service: Endoscopy;  Laterality: N/A;  1:45pm   ESOPHAGOGASTRODUODENOSCOPY (EGD) WITH PROPOFOL  N/A 05/20/2020   patent tubular esophagus s/p dilation, single island of salmon-colored epithelium. Barrett's esophagus. 3 year surveillance.   ESOPHAGOGASTRODUODENOSCOPY (EGD) WITH PROPOFOL  N/A 10/17/2020   food in upper third of esophagus, gastric bypass with normal pouch. GJ anastomosis normal appearing.   ESOPHAGOGASTRODUODENOSCOPY (EGD) WITH PROPOFOL  N/A 12/30/2021   Procedure: ESOPHAGOGASTRODUODENOSCOPY (EGD) WITH PROPOFOL ;  Surgeon: Cindie Carlin POUR, DO;  [facility]: [facility];  Service: Endoscopy;  Laterality: N/A;   FLEXIBLE SIGMOIDOSCOPY  12/30/2021   Procedure: FLEXIBLE SIGMOIDOSCOPY;  Surgeon: Cindie Carlin POUR, DO;  [facility];  Service: Endoscopy;;   GASTRIC BYPASS  12/08/1979   HIP SURGERY Bilateral    fractured bilateral hips   INTRAMEDULLARY (IM) NAIL INTERTROCHANTERIC Right 03/08/2013   Procedure: INTRAMEDULLARY (IM) NAIL INTERTROCHANTRIC;  Surgeon: Taft FORBES Minerva, MD;  Location: [facility];  Service: Orthopedics;  Laterality: Right;   MALONEY DILATION N/A 05/20/2020   Procedure: AGAPITO DILATION;  Surgeon: Shaaron Lamar HERO, MD;  [facility]: [facility];  Service: Endoscopy;  Laterality: N/A;   SAVORY DILATION N/A  03/04/2016   Procedure: SAVORY DILATION;  Surgeon: Margo LITTIE Harvey, MD;  [facility]: [facility];  Service: Endoscopy;  Laterality: N/A;   SAVORY DILATION N/A 05/23/2019   Procedure: SAVORY DILATION;  Surgeon: Harvey Margo LITTIE, MD;  [facility]: [facility];  Service: Endoscopy;  Laterality: N/A;     Allergies[2]    Family History  Problem Relation Age of Onset   Throat cancer Mother    COPD Father    Stomach cancer Sister    COPD Sister    Colon polyps Sister    COPD Sister    Colon cancer Neg Hx    Esophageal cancer Neg Hx      Social History Ellen Anderson reports that she has never smoked. She has never used smokeless tobacco. Ellen Anderson reports no history of alcohol use.    Physical Examination Today's Vitals   12/21/24 1418  BP: 110/70  Pulse: 86  SpO2: 95%  Weight:  151 lb (68.5 kg)  Height: 5' 2 (1.575 m)   Body mass index is 27.62 kg/m.  Gen: resting comfortably, no acute distress HEENT: no scleral icterus, pupils equal round and reactive, no palptable cervical adenopathy,  CV: RRR, no m/rg no jvd Resp: Clear to auscultation bilaterally GI: abdomen is soft, non-tender, non-distended, normal bowel sounds, no hepatosplenomegaly MSK: extremities are warm, no edema.  Skin: warm, no rash Neuro:  no focal deficits Psych: appropriate affect     Assessment and Plan  1.Abnormal EKG -EKG from pcp computer report questions if prior anterior infarct, I do not see evidence of that - EKG today in clinic is essentially normal - she has no cardipulmonary symptoms  No indication for additional cardiac testing at this time  F/u just as needed     Dorn PHEBE Ross, M.D.     [1]  Allergies Allergen Reactions   Amoxicillin-Pot Clavulanate Swelling    Of throat. States she can take if given with prednisone and Benadryl    Ceclor [Cefaclor] Swelling    Of throat. States I can take with prednisone and Benadryl    Elemental Sulfur Shortness Of Breath     Tongue swelling   Morphine And Codeine Other (See Comments)    Causes heart to stop. Incidence occurred at [facility] about 6 years ago.   Azithromycin Other (See Comments)    States All '-mycins' break my mouth out in blisters. Can take penicillin   Flexeril [Cyclobenzaprine] Hives and Swelling    Breaks out in hives and throat swells up.   Ivp Dye [Iodinated Contrast Media] Other (See Comments)    convulsions   Methadone Other (See Comments)    hallucinations   Percocet [Oxycodone -Acetaminophen ] Itching    Scratches til she bleeds   Betadine [Povidone Iodine] Rash   Codeine Hives and Rash   Escitalopram Oxalate Other (See Comments)    hallucinations   Ibuprofen Hives and Rash    Severe rash per patient   Tylenol  [Acetaminophen ] Hives, Nausea Only and Rash    Severe itching  [2]  Allergies Allergen Reactions   Amoxicillin-Pot Clavulanate Swelling    Of throat. States she can take if given with prednisone and Benadryl    Ceclor [Cefaclor] Swelling    Of throat. States I can take with prednisone and Benadryl    Elemental Sulfur Shortness Of Breath    Tongue swelling   Morphine And Codeine Other (See Comments)    Causes heart to stop. Incidence occurred at [facility] about 6 years ago.   Azithromycin Other (See Comments)    States All '-mycins' break my mouth out in blisters. Can take penicillin   Flexeril [Cyclobenzaprine] Hives and Swelling    Breaks out in hives and throat swells up.   Ivp Dye [Iodinated Contrast Media] Other (See Comments)    convulsions   Methadone Other (See Comments)    hallucinations   Percocet [Oxycodone -Acetaminophen ] Itching    Scratches til she bleeds   Betadine [Povidone Iodine] Rash   Codeine Hives and Rash   Escitalopram Oxalate Other (See Comments)    hallucinations   Ibuprofen Hives and Rash    Severe rash per patient   Tylenol  [Acetaminophen ] Hives, Nausea Only and Rash    Severe itching   "

## 2024-12-21 NOTE — Patient Instructions (Signed)
Medication Instructions:  Continue all current medications.  Labwork: none  Testing/Procedures: none  Follow-Up: As needed.    Any Other Special Instructions Will Be Listed Below (If Applicable).  If you need a refill on your cardiac medications before your next appointment, please call your pharmacy.  

## 2024-12-27 NOTE — Telephone Encounter (Signed)
 noted

## 2025-04-13 ENCOUNTER — Inpatient Hospital Stay

## 2025-04-20 ENCOUNTER — Inpatient Hospital Stay: Admitting: Oncology
